# Patient Record
Sex: Female | Born: 1990 | Race: White | Hispanic: No | Marital: Married | Smoking: Never smoker
Health system: Southern US, Community
[De-identification: ages and names within clinical notes are randomized; demographics above are authoritative.]

## PROBLEM LIST (undated history)

## (undated) ENCOUNTER — Inpatient Hospital Stay (HOSPITAL_COMMUNITY): Payer: Self-pay

## (undated) DIAGNOSIS — T781XXA Other adverse food reactions, not elsewhere classified, initial encounter: Secondary | ICD-10-CM

## (undated) DIAGNOSIS — Z3490 Encounter for supervision of normal pregnancy, unspecified, unspecified trimester: Secondary | ICD-10-CM

## (undated) DIAGNOSIS — Z8759 Personal history of other complications of pregnancy, childbirth and the puerperium: Secondary | ICD-10-CM

## (undated) DIAGNOSIS — F909 Attention-deficit hyperactivity disorder, unspecified type: Secondary | ICD-10-CM

## (undated) DIAGNOSIS — R569 Unspecified convulsions: Secondary | ICD-10-CM

## (undated) DIAGNOSIS — I10 Essential (primary) hypertension: Secondary | ICD-10-CM

## (undated) DIAGNOSIS — T7819XA Other adverse food reactions, not elsewhere classified, initial encounter: Secondary | ICD-10-CM

## (undated) HISTORY — PX: NO PAST SURGERIES: SHX2092

## (undated) HISTORY — DX: Unspecified convulsions: R56.9

## (undated) HISTORY — DX: Personal history of other complications of pregnancy, childbirth and the puerperium: Z87.59

## (undated) HISTORY — DX: Attention-deficit hyperactivity disorder, unspecified type: F90.9

---
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
## 2011-04-28 NOTE — Discharge Summary (Signed)
[place]. [facility]  Patient:    Barbara Robertson, Barbara Robertson                      MRN: [id number] Adm. Date:  [id number] Disc. Date: [id number] Attending:  Mick Sell                           Discharge Summary  NEUROLOGIC:  Mental state is awake, alert, almost giddy, pleasant cooperative without dysphagia or ______ .  CRANIAL NERVE:  Round, reactive pupils, normal fundi, full facial fields, two double simultaneous stimuli.  Extraocular movements full and conjugate.  Okay and responses were not carried out.  Symmetric facial strength and sensation. Air conduction greater than bone conduction bilaterally.  MOTOR EXAMINATION:  Normal strength, tone and ______ no pronator drift. Sensation tactile, ______ examination, good finger-to-nose.  Rapid repetitive movements ______ were normal.  Deep tendon reflexes were symmetric and diminished.  SUMMARY OF THE LABORATORY STUDIES:  Initial pH 7.137, pCO2 93.4, pO2 367, bicarbonate 32.  After bagging, pH 7.334, pCO2 54.2 pO2 109, bicarbonate 29. Sodium 138, potassium 3.7, chloride 102, CO2 30, glucose 137, BUN 13, creatinine 0.4, calcium 8.9, total protein 7.1, albumin 4.0, SGOT 25, SGPT 14, alkaline phosphatase 254, total bilirubin 0.7.  CBC, white count 12,600, hemoglobin 13.7, hematocrit 39.9, MCV 89.3, platelet count 424,000.  There are 60 polys, 31 lymphs, 7 monos, 1 eosinophil, 1 basophil.  The patient has had regular sinus rhythm on telemetry.  Today, she is in good spirits, at normal baseline and is ready for discharge.  DISCHARGE MEDICATIONS:  She will go home on Depakote 250 mg 4 times a day.  FOLLOWUP:  Blood types will be checked in April to look at the trough of valproic acid level.  The patient will return in April or May for a return visit, sooner depending upon clinical need. DD:  02/23/01 TD:  02/23/01 Job: [id number]

## 2011-04-28 NOTE — Discharge Summary (Signed)
[place]. [facility]  Patient:    Barbara Robertson, Barbara Robertson                      MRN: [id number] Adm. Date:  [id number] Disc. Date: [id number] Attending:  Mick Sell                           Discharge Summary  FINAL DIAGNOSES: 1. 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 2. Complex partial seizures with secondary generalization, 345.40, 345.10. 3. Respiratory failure secondary to seizures, resolved.  PROCEDURES:  None.  SUMMARY OF THE HOSPITALIZATION:  The patient was admitted to the hospital after a period of over one hour of nearly continuous seizures.  The seizures began with the patient talking nonsense, but using real words and not truly responding to her parents and proceeded with forced eye deviation to the right and jerking movements of the right arm and leg.  Episodes continued on and off as the parents drove the patient into the hospital, and even continued afterwards.  The patient received 0.5 mg of Ativan which only briefly followed procedures.  They stopped completely when she was given Valium 5 mg.  At that point, arterial blood gas showed evidence of respiratory failure with a pH of 7.13, pCO2 of 95.4.  The patient was bagged for 20 minutes with marked improvement and increase in the level of arousal and activity in the patient.  The patients valproic acid level was 45.4, (two hours after morning trough). The decision was made to increase her Depakote.  I thought that she had evidence of otitis media.  Pediatric house officers did not agree and therefore she was not treated.  Through the night, she has been well, there have been no seizures.  Her current vital signs are:  Temperature 98.6, resting pulse 104, respirations 20, pulse oximetry 98%.  Head, eyes, ears, nose, and throat:  No signs of infection. Lungs:  Clear to auscultation.  Heart:  No murmurs.  Pulses normal. Abdomen:  Soft, nontender, bowel sounds are normal.  Extremities:  Well formed  without edema, cyanosis, alterations in tone, or tight heel cords. Neurologic:  Mental status:  Awake, alert, almost giddy, pleasant cooperative Cranial nerves:  Round, reactive pupils, normal fundi, full visual fields to double simultaneous stimuli.  Extraocular movements full and conjugate.  OKN responses were not carried out.  Symmetric facial strength and sensation.  Air conduction greater than bone conduction bilaterally.  Motor examination: Normal strength, tone, and mass.  Good fine motor movements, no pronator drift.  Sensation intact to cold, vibration, 1 stereognosis.  Cerebellar examination:  Good finger-to-nose.  Rapid repetitive movements.  No tremor, dystaxia, or dysmetria.  Gait and station were normal.  Deep tendon reflexes were symmetric and diminished.  SUMMARY OF THE LABORATORY STUDIES:  Initial pH 7.137, pCO2 93.4, pO2 367, bicarbonate 32.  After bagging, pH 7.334, pCO2 54.2 pO2 109, bicarbonate 29.  Sodium 138, potassium 3.7, chloride 102, CO2 30, glucose 137, BUN 13, creatinine 0.4, calcium 8.9, total protein 7.1, albumin 4.0, SGOT 25, SGPT 14, alkaline phosphatase 254, total bilirubin 0.7.  CBC, white count 12,600, hemoglobin 13.7, hematocrit 39.9, MCV 89.3, platelet count 424,000.  There are 60 polys, 31 lymphs, 7 monos, 1 eosinophil, 1 basophil.  The patient has had regular sinus rhythm on telemetry.  Today, she is in good spirits, at normal baseline and is ready for discharge.  DISCHARGE MEDICATIONS:  She will go home  on Depakote 250 mg 4 times a day.  FOLLOWUP:  Blood types will be checked in April to look at the trough of valproic acid level.  The patient will return in April or May for a return visit, sooner depending upon clinical need. without dysphagia or dystaxia. DD:  02/23/01 TD:  02/23/01 Job: [id number]

## 2011-04-28 NOTE — Discharge Summary (Signed)
[place]. [facility]  Patient:    Barbara Robertson, Barbara Robertson                      MRN: [id number] Adm. Date:  [id number] Disc. Date: [id number] Attending:  Mick Sell                           Discharge Summary  No dictation. DD:  02/23/01 TD:  02/23/01 Job: [id number]

## 2011-04-28 NOTE — H&P (Signed)
[place]. [facility]  Patient:    Barbara Robertson, Barbara Robertson                      MRN: [id number] Adm. Date:  [id number] Attending:  Molpus, Carlisle Beers CC:         Duard Brady, M.D.   History and Physical  DATE OF BIRTH:  1991/11/08  CHIEF COMPLAINT:  Status epilepticus.  HISTORY OF THE PRESENT CONDITION:  A [age]-year-old right-handed Caucasian female who had onset this morning at 8:20 a.m. of speaking nonsense as she awakened.  She was speaking real words but was not truly responding to her parents.  Her eyes began to deviate to the right at 8:30 a.m.  This was followed by jerking of the right arm and leg.  Parents recognized that the seizure was prolonged by 8:50 a.m., called our office, and were advised to transport the patient.  They arrived at 9:38 a.m. Ativan was given 0.5 mg at 9:45 a.m.  I came to see the patient around 9:50 a.m. and noted that the patient was postictal.  She then began to have more seizures and was treated with Valium 5 mg at 9:56 a.m. which relieved her seizures.  I ordered a STAT blood gas which showed a pH of 7.131, PCO2 95.4, PO2 369, bicarbonate 32.  She was bagged for 20 minutes.  Repeat blood gas pH 7.334, PCO2 54.2, PO2 109, bicarbonate 29.  The patient, during that time, became much more responsive, breathing on her own, and slightly combative.  MEDICATIONS:  Her current medications are Depakote 250 mg in the morning, 125 at noon, 125 at dinner, 250 at night - using 125 mg tablets.  The patient had been on phenobarbital and Tegretol in the past without success.  ALLERGIES TO MEDICINES:  None known.  HISTORY:  Mindys last seizure was August 17, 2000 - this was a brief episode at home lasting 10 to 15 minutes.  Her last seizure before that was two-and-a-half years ago.  She had onset of her seizures at 25 of age.  EEG in October 2001 was normal.  CT scan of the brain was normal other than enlarged  subarachnoid spaces back in 1993.  EEG soon after her right focal motor seizures showed left temporal slowing without seizure activity.  REVIEW OF SYSTEMS:  The patient had a viral syndrome with TMAX 100.5 degrees F treated with Tylenol on Tuesday.  She had otherwise been acting well.  She has had no problems with compliance.  Review of systems is otherwise negative for all major systems.  SOCIAL HISTORY:  Patient is in the third grade at [employer] doing well.  She is not receiving assistance.  She is working on grade level. She has no other outside activities.  FAMILY HISTORY:  Her father had a seizure as a boy.  There are no other neurologic medical problems.  PHYSICAL EXAMINATION:  VITAL SIGNS:  Blood pressure 82/58, resting pulse 110, respirations 50, pulse oximetry 97%.  HEENT:  Bilateral otitis media.  Pharynx is negative.  NECK:  Supple.  LUNGS:  Showed rhonchi and she was moving air poorly initially; now much better.  HEART:  No murmurs, pulses normal.  ABDOMEN:  Soft, bowel sounds normal.  EXTREMITIES:  Normal.  NEUROLOGIC:  Patient is a [age]-year-old right-handed girl who is postictal. Cranial nerves 2-12 reveal round, reactive pupils, 4 mm to 3 mm.  Fundi were normal.  No  blink to threat.  She had symmetric facial strength.  Motor examination shows moving all four extremities in a semi-purposeful way. Sensory shows she withdraws x 4.  Deep tendon reflexes were diminished.  She had bilateral flexor plantar responses.  IMPRESSION: 1. Status epilepticus with a left brain signature, partial onset with    secondary generalization - 345.3, 345.40, 345.10. 2. Respiratory failure secondary to seizures, which has resolved. 3. Etiology of this seizure is unknown. 4. Bilateral otitis media.  PLAN:  Patient will be given IV Depacon until she awakens and then change to Depakote.  She will have a comprehensive metabolic and a CBC with differential in  addition to trough valproic acid level.  She will be admitted for 24-hour observation, longer if necessary.  She will treated with amoxicillin for her ear infection.  The possibility of aspiration exists because she did vomit once.  She also lost control of her bladder in the postictal state.  She will be admitted to the pediatric service under my name with assistance from the pediatric house staff.  I have contacted ______ and he has agreed to come see the patient. DD:  02/22/01 TD:  02/22/01 Job: [id number]

## 2012-09-09 ENCOUNTER — Encounter (HOSPITAL_COMMUNITY): Payer: Self-pay | Admitting: *Deleted

## 2012-09-09 ENCOUNTER — Emergency Department (HOSPITAL_COMMUNITY)
Admission: EM | Admit: 2012-09-09 | Discharge: 2012-09-09 | Disposition: A | Discharge status: Home / Self Care | Payer: BC Managed Care – PPO | Attending: Emergency Medicine | Admitting: Emergency Medicine

## 2012-09-09 ENCOUNTER — Emergency Department (HOSPITAL_COMMUNITY): Payer: BC Managed Care – PPO

## 2012-09-09 DIAGNOSIS — M899 Disorder of bone, unspecified: Secondary | ICD-10-CM | POA: Insufficient documentation

## 2012-09-09 DIAGNOSIS — M25519 Pain in unspecified shoulder: Secondary | ICD-10-CM | POA: Insufficient documentation

## 2012-09-09 MED ORDER — IBUPROFEN 800 MG PO TABS
800.0000 mg | ORAL_TABLET | Freq: Once | ORAL | Status: AC
  Administered 2012-09-09: 800 mg via ORAL
  Filled 2012-09-09: qty 1

## 2012-09-09 MED ORDER — NAPROXEN 500 MG PO TABS: 500.0000 mg | ORAL_TABLET | Freq: Two times a day (BID) | ORAL | Status: DC

## 2012-09-09 MED ORDER — HYDROCODONE-ACETAMINOPHEN 5-325 MG PO TABS: ORAL_TABLET | ORAL | Status: DC

## 2012-09-09 NOTE — ED Provider Notes (Signed)
History     CSN: [id number]  Arrival date & time 09/09/12  1310   First MD Initiated Contact with Patient 09/09/12 1325      Chief Complaint  Patient presents with  . Shoulder Pain    (Consider location/radiation/quality/duration/timing/severity/associated sxs/prior treatment) HPI Comments: Patient c/o pain to her right shoulder that began after helping move furniture.  Pain to the shoulder is worse with certain movements and improves with rest.  She denies redness, fever, neck pain, swelling, distal numbness or weakness.  Denies previous injury or pain tot he shoulder.  Pt is right hand dominant  Patient is a 21 y.o. female presenting with shoulder pain. The history is provided by the patient.  Shoulder Pain This is a new problem. The current episode started today. The problem occurs constantly. The problem has been unchanged. Associated symptoms include arthralgias. Pertinent negatives include no chest pain, chills, fever, headaches, joint swelling, myalgias, neck pain, numbness, rash, sore throat, vomiting or weakness. The symptoms are aggravated by bending (movement). She has tried nothing for the symptoms. The treatment provided no relief.    History reviewed. No pertinent past medical history.  History reviewed. No pertinent past surgical history.  History reviewed. No pertinent family history.  History  Substance Use Topics  . Smoking status: Never Smoker   . Smokeless tobacco: Not on file  . Alcohol Use: No    OB History    Grav Para Term Preterm Abortions TAB SAB Ect Mult Living                  Review of Systems  Constitutional: Negative for fever and chills.  HENT: Negative for sore throat and neck pain.   Respiratory: Negative for shortness of breath.   Cardiovascular: Negative for chest pain.  Gastrointestinal: Negative for vomiting.  Genitourinary: Negative for dysuria and difficulty urinating.  Musculoskeletal: Positive for arthralgias. Negative for  myalgias, back pain and joint swelling.  Skin: Negative for color change, rash and wound.  Neurological: Negative for weakness, numbness and headaches.  All other systems reviewed and are negative.    Allergies  Review of patient's allergies indicates no known allergies.  Home Medications   Current Outpatient Rx  Name Route Sig Dispense Refill  . LEVONORGESTREL-ETHINYL ESTRAD 0.1-20 MG-MCG PO TABS Oral Take 1 tablet by mouth daily.      BP 175/86  Pulse 121  Temp 98.8 F (37.1 C) (Oral)  Resp 20  Ht 6' (1.829 m)  Wt 205 lb (92.987 kg)  BMI 27.80 kg/m2  SpO2 98%  LMP 09/02/2012  Physical Exam  Nursing note and vitals reviewed. Constitutional: She is oriented to person, place, and time. She appears well-developed and well-nourished. No distress.  HENT:  Head: Normocephalic and atraumatic.  Neck: Normal range of motion. Neck supple. No thyromegaly present.  Cardiovascular: Normal rate, regular rhythm, normal heart sounds and intact distal pulses.   No murmur heard. Pulmonary/Chest: Effort normal and breath sounds normal. No respiratory distress. She exhibits no tenderness.  Musculoskeletal: She exhibits tenderness. She exhibits no edema.       Right shoulder: She exhibits tenderness. She exhibits normal range of motion, no bony tenderness, no swelling, no effusion, no crepitus, no deformity, no laceration, no spasm, normal pulse and normal strength.       Arms:      ttp of the lateral right shoulder.  Pain with abduction of the right arm and rotation of the shoulder.  Radial pulse is brisk, sensation intact,  CR< 2 sec.  No abrasions, edema or deformity of the joint.   Lymphadenopathy:    She has no cervical adenopathy.  Neurological: She is alert and oriented to person, place, and time. She has normal strength. No cranial nerve deficit or sensory deficit. She exhibits normal muscle tone. Coordination normal.  Reflex Scores:      Tricep reflexes are 2+ on the right side and  2+ on the left side.      Bicep reflexes are 2+ on the right side and 2+ on the left side. Skin: Skin is warm and dry.    ED Course  Procedures (including critical care time)  Labs Reviewed - No data to display Dg Shoulder Right  09/09/2012  *RADIOLOGY REPORT*  Clinical Data: Shoulder pain, lifting heavy boxes 2 weeks ago  RIGHT SHOULDER - 2+ VIEW  Comparison: None.  Findings:  There is an approximately 1.3 x 1.0 cm lucent lesion within the proximal humeral metaphysis.  The provided internal rotation radiograph demonstrates this lesion to be centered within the cortex.  The borders of this lesion appear well defined on the provided AP radiograph, however there appears to be a minimal amount of periostitis involving the inferior aspect of lesion on the provided internal radiograph, possibly accentuated due to obliquity.  This finding is without associated fracture.  Limited visualization of the adjacent glenohumeral and AC joints is normal.  Limited visualization of adjacent thorax is normal.  Regional soft tissues are normal.  IMPRESSION: 1.  No fracture or dislocation. 2.  Approximately 1.3 cm cortically based lesion involving the anterior cortex of the proximal humeral metaphysis.  As there is a suggestion of a minimal amount of periostitis involving the inferior aspect of the lesion's margin on the provided internal radiograph, further evaluation with shoulder MRI is recommended.  The above findings discussed with Dr. Preston Fleeting at 1441   Original Report Authenticated By: Waynard Reeds, M.D.     Sling applied for comfort, remains NV intact    MDM   Pt advised of x-ray findings.  Agrees to return here for a MRI.  Arranged patient to return here for outpatient MRI right shoulder on Wednesday, 09/11/2012 at 6 PM.  Pt agrees to then f/u with orthopedics.  Referral given to Dr. Hilda Lias   The patient appears reasonably screened and/or stabilized for discharge and I doubt any other medical condition  or other [facility] requiring further screening, evaluation, or treatment in the ED at this time prior to discharge.   Rx: Norco #20 naprosyn      Camari Wisham L. [place], [place] 09/11/12 1543

## 2012-09-09 NOTE — ED Notes (Signed)
Pain rt shoulder , injury when helping move furniture.Marland Kitchen

## 2012-09-09 NOTE — ED Notes (Signed)
injuried right shoulder 2 weeks ago while moving furniture, states pain got better but today with sharp pain to area

## 2012-09-11 ENCOUNTER — Ambulatory Visit (HOSPITAL_COMMUNITY)
Admit: 2012-09-11 | Discharge: 2012-09-11 | Disposition: A | Discharge status: Home / Self Care | Payer: BC Managed Care – PPO | Source: Ambulatory Visit | Attending: Family Medicine | Admitting: Family Medicine

## 2012-09-11 DIAGNOSIS — M25519 Pain in unspecified shoulder: Secondary | ICD-10-CM | POA: Insufficient documentation

## 2012-09-11 DIAGNOSIS — R937 Abnormal findings on diagnostic imaging of other parts of musculoskeletal system: Secondary | ICD-10-CM | POA: Insufficient documentation

## 2012-09-12 NOTE — ED Provider Notes (Signed)
Medical screening examination/treatment/procedure(s) were performed by non-physician practitioner and as supervising physician I was immediately available for consultation/collaboration.   Dellar Traber, MD 09/12/12 0702 

## 2012-10-30 ENCOUNTER — Ambulatory Visit: Payer: BC Managed Care – PPO | Admitting: Orthopedic Surgery

## 2012-11-14 ENCOUNTER — Ambulatory Visit (INDEPENDENT_AMBULATORY_CARE_PROVIDER_SITE_OTHER): Payer: BC Managed Care – PPO | Admitting: Orthopedic Surgery

## 2012-11-14 ENCOUNTER — Encounter: Payer: Self-pay | Admitting: Orthopedic Surgery

## 2012-11-14 DIAGNOSIS — M75101 Unspecified rotator cuff tear or rupture of right shoulder, not specified as traumatic: Secondary | ICD-10-CM | POA: Insufficient documentation

## 2012-11-14 DIAGNOSIS — M67919 Unspecified disorder of synovium and tendon, unspecified shoulder: Secondary | ICD-10-CM

## 2012-11-14 MED ORDER — IBUPROFEN 800 MG PO TABS: 800.0000 mg | ORAL_TABLET | Freq: Three times a day (TID) | ORAL | Status: DC | PRN

## 2012-11-14 MED ORDER — TRAMADOL-ACETAMINOPHEN 37.5-325 MG PO TABS: 1.0000 | ORAL_TABLET | ORAL | Status: DC | PRN

## 2012-11-14 NOTE — Progress Notes (Signed)
Patient ID: Barbara Robertson, female   DOB: 1991-04-25, 21 y.o.   MRN: [id number] Chief Complaint  Patient presents with  . Shoulder Pain    Right shoulder pain for 6 months. Dr Gerda Diss referral      21 year old female history of bilateral shoulder pain in high school treated with physical therapy didn't help now works in [employer] site and complaint of right shoulder pain which she says she reaggravated moving some furniture about 6 months ago. She complains of sharp dull throbbing pain with stabbing locking and catching over the right deltoid and rates the pain 10 out of 10. She took some ibuprofen it didn't help she went to the ER she received some Norco that made her sick so she didn't take that. She does have night pain pain with overhead activity  Review of systems blurred vision nausea, vomiting, dizziness, bruises easily, adverse food reaction  Medical history reviewed.  Physical Exam(12)  Vital signs:   GENERAL: normal development   CDV: pulses are normal   Skin: normal  Lymph: nodes were not palpable/normal  Psychiatric: awake, alert and oriented  Neuro: normal sensation  MSK  Gait: Normal 1 Inspection right shoulder no pain swelling. There is some tenderness under the posterior acromion and also near the deltoid insertion, tenderness and the rotator interval 2 Range of Motion range of motion is normal 3 Motor strength is normal 4 Stability stability is normal  Provocative tests for the shoulder the Neer and impingement Hawkins maneuvers do cause some discomfort  Other side:  5 normal range of motion normal strength 6 normal stability no tenderness  Neck full range of motion  Imaging include right shoulder 2 views on September 30 an MRI on October 2 x-ray shows 1.3 cm cortical lesion proximal humeral metaphysis which may be either an aneurysmal bone cyst eosinophilic granuloma or benign cortical bone cyst  Radiology recommends a six-month followup  MRI    Assessment:  Rotator cuff syndrome right shoulder  Bone lesion right humeral metaphysis unclear needs followup, differential diagnosis aneurysmal bone cyst, eosinophilic granuloma, benign cortical bone cyst.    Plan: Subacromial injection, physical therapy, ibuprofen, Ultracet  Come back 2 months  Shoulder Injection Procedure Note   Pre-operative Diagnosis: right  RC Syndrome  Post-operative Diagnosis: same  Indications: pain   Anesthesia: ethyl chloride   Procedure Details   Verbal consent was obtained for the procedure. The shoulder was prepped withalcohol and the skin was anesthetized. A 20 gauge needle was advanced into the subacromial space through posterior approach without difficulty  The space was then injected with 3 ml 1% lidocaine and 1 ml of depomedrol. The injection site was cleansed with isopropyl alcohol and a dressing was applied.  Complications:  None; patient tolerated the procedure well.

## 2012-11-14 NOTE — Patient Instructions (Signed)
You have received a steroid shot. 15% of patients experience increased pain at the injection site with in the next 24 hours. This is best treated with ice and tylenol extra strength 2 tabs every 8 hours. If you are still having pain please call the office.    Call hospital to arrange PT    Rotator Cuff Tendonitis   The rotator cuff is the collection of all the muscles and tendons (the supraspinatus, infraspinatus, subscapularis, and teres minor muscles and their tendons) that help your shoulder stay in place. This unit holds the head of the upper arm bone (humerus) in the cup (fossa) of the shoulder blade (scapula). Basically, it connects the arm to the shoulder. Tendinitis is a swelling and irritation of the tissue, called cord like structures (tendons) that connect muscle to bone. It usually is caused by overusing the joint involved. When the tissue surrounding a tendon (the synovium) becomes inflamed, it is called tenosynovitis. This also is often the result of overuse in people whose jobs require repetitive (over and over again) types of motion. HOME CARE INSTRUCTIONS    Use a sling or splint for as long as directed by your caregiver until the pain decreases.   Apply ice to the injury for 15 to 20 minutes, 3 to 4 times per day. Put the ice in a plastic bag and place a towel between the bag of ice and your skin.   Try to avoid use other than gentle range of motion while your shoulder is painful. Use and exercise only as directed by your caregiver. Stop exercises or range of motion if pain or discomfort increases, unless directed otherwise by your caregiver.   Only take over-the-counter or prescription medicines for pain, discomfort, or fever as directed by your caregiver.   If you were give a shoulder sling and straps (immobilizer), do not remove it except as directed, or until you see a caregiver for a follow-up examination. If you need to remove it, move your arm as little as possible or as  directed.   You may want to sleep on several pillows at night to lessen swelling and pain.  SEEK IMMEDIATE MEDICAL CARE IF:    Pain in your shoulder increases or new pain develops in your arm, hand, or fingers and is not relieved with medications.   You develop new, unexplained symptoms, especially increased numbness in the hands or loss of strength, or you develop any worsening of the problems which brought you in for care.   Your arm, hand, or fingers are numb or tingling.   Your arm, hand, or fingers are swollen, painful, or turn white or blue.  Document Released: 02/17/2004 Document Revised: 02/19/2012 Document Reviewed: 09/24/2008 [facility] Patient Information 2013 [place], [place].

## 2013-03-20 ENCOUNTER — Encounter: Payer: Self-pay | Admitting: *Deleted

## 2013-03-26 ENCOUNTER — Ambulatory Visit (INDEPENDENT_AMBULATORY_CARE_PROVIDER_SITE_OTHER): Payer: BC Managed Care – PPO | Admitting: Nurse Practitioner

## 2013-03-26 VITALS — BP 146/88 | Ht 73.0 in | Wt 216.8 lb

## 2013-03-26 DIAGNOSIS — N912 Amenorrhea, unspecified: Secondary | ICD-10-CM

## 2013-03-27 DIAGNOSIS — N912 Amenorrhea, unspecified: Secondary | ICD-10-CM | POA: Insufficient documentation

## 2013-03-27 NOTE — Assessment & Plan Note (Signed)
Qualitative serum hCG ordered. Advised patient to contact us if she does not have a regular menstrual cycle at least every 3 months. Discussed preconceptual care including starting a daily multivitamin. Call back if any problems.

## 2013-03-27 NOTE — Progress Notes (Signed)
Subjective:  Presents to discuss her menstrual cycles. Has been off her birth control pills for at least 2 months. Noticed progressive nausea. Cycles are slightly irregular, longer periods of time between cycles which are shorter only lasting a couple of days. Did not have a cycle in March, had a fairly normal cycle for 2 days in April. Has also had some slight breakthrough bleeding. Married, same sexual partner. No vaginal discharge. No pelvic pain. No fever. Patient wishes to hold off on contraceptives, having unprotected sex. While not actively trying to conceive, states it's okay if it happens. Has taken several home pregnancy tests all which were negative. No breast tenderness nausea or vomiting.  Objective:   BP 146/88  Ht 6\' 1"  (1.854 m)  Wt 216 lb 12.8 oz (98.34 kg)  BMI 28.61 kg/m2  LMP 03/11/2013 NAD. Alert, oriented. Lungs clear. Heart regular rate rhythm.

## 2013-06-18 ENCOUNTER — Telehealth: Payer: Self-pay | Admitting: Family Medicine

## 2013-06-18 NOTE — Telephone Encounter (Signed)
Patient is calling to say that she took 2 at home pregnancy tests yesterday and both were positive and she would like to know if you would suggest her keeping her appointment for Friday?

## 2013-06-18 NOTE — Telephone Encounter (Signed)
Has appointment with Barbara Robertson on Friday.

## 2013-06-18 NOTE — Telephone Encounter (Signed)
Yes, we can confirm plus help with symptoms supplements etc. While waiting to see ob.

## 2013-06-18 NOTE — Telephone Encounter (Signed)
Discussed with patient. Patient to keep appointment.

## 2013-06-20 ENCOUNTER — Ambulatory Visit (INDEPENDENT_AMBULATORY_CARE_PROVIDER_SITE_OTHER): Payer: BC Managed Care – PPO | Admitting: Nurse Practitioner

## 2013-06-20 ENCOUNTER — Encounter: Payer: Self-pay | Admitting: Nurse Practitioner

## 2013-06-20 DIAGNOSIS — N912 Amenorrhea, unspecified: Secondary | ICD-10-CM

## 2013-06-20 DIAGNOSIS — Z3490 Encounter for supervision of normal pregnancy, unspecified, unspecified trimester: Secondary | ICD-10-CM

## 2013-06-22 ENCOUNTER — Inpatient Hospital Stay (HOSPITAL_COMMUNITY)
Admission: AD | Admit: 2013-06-22 | Discharge: 2013-06-23 | Disposition: A | Discharge status: Home / Self Care | Payer: BC Managed Care – PPO | Source: Ambulatory Visit | Attending: Obstetrics & Gynecology | Admitting: Obstetrics & Gynecology

## 2013-06-22 DIAGNOSIS — O209 Hemorrhage in early pregnancy, unspecified: Secondary | ICD-10-CM | POA: Insufficient documentation

## 2013-06-22 DIAGNOSIS — O360990 Maternal care for other rhesus isoimmunization, unspecified trimester, not applicable or unspecified: Secondary | ICD-10-CM | POA: Insufficient documentation

## 2013-06-23 ENCOUNTER — Inpatient Hospital Stay (HOSPITAL_COMMUNITY): Payer: BC Managed Care – PPO

## 2013-06-23 ENCOUNTER — Encounter (HOSPITAL_COMMUNITY): Payer: Self-pay

## 2013-06-23 ENCOUNTER — Encounter: Payer: Self-pay | Admitting: Nurse Practitioner

## 2013-06-23 DIAGNOSIS — O360990 Maternal care for other rhesus isoimmunization, unspecified trimester, not applicable or unspecified: Secondary | ICD-10-CM | POA: Diagnosis not present

## 2013-06-23 DIAGNOSIS — Z3400 Encounter for supervision of normal first pregnancy, unspecified trimester: Secondary | ICD-10-CM | POA: Insufficient documentation

## 2013-06-23 DIAGNOSIS — O209 Hemorrhage in early pregnancy, unspecified: Secondary | ICD-10-CM | POA: Diagnosis not present

## 2013-06-23 LAB — WET PREP, GENITAL

## 2013-06-23 LAB — URINALYSIS, ROUTINE W REFLEX MICROSCOPIC
Bilirubin Urine: NEGATIVE
Ketones, ur: NEGATIVE mg/dL
Leukocytes, UA: NEGATIVE
Nitrite: NEGATIVE
Protein, ur: NEGATIVE mg/dL
Specific Gravity, Urine: 1.01 (ref 1.005–1.030)
Urobilinogen, UA: 1 mg/dL (ref 0.0–1.0)

## 2013-06-23 LAB — CBC
MCHC: 35.7 g/dL (ref 30.0–36.0)
MCV: 84.5 fL (ref 78.0–100.0)
RDW: 12.7 % (ref 11.5–15.5)

## 2013-06-23 LAB — POCT PREGNANCY, URINE: Preg Test, Ur: POSITIVE — AB

## 2013-06-23 LAB — GC/CHLAMYDIA PROBE AMP: CT Probe RNA: NEGATIVE

## 2013-06-23 LAB — ABO/RH: ABO/RH(D): A NEG

## 2013-06-23 LAB — URINE MICROSCOPIC-ADD ON

## 2013-06-23 MED ORDER — RHO D IMMUNE GLOBULIN 1500 UNIT/2ML IJ SOLN
300.0000 ug | Freq: Once | INTRAMUSCULAR | Status: AC
  Administered 2013-06-23: 300 ug via INTRAMUSCULAR
  Filled 2013-06-23: qty 2

## 2013-06-23 NOTE — MAU Note (Signed)
Pt reports blood on tissue tonight, denies pain. Positive home preg test x 2

## 2013-06-23 NOTE — MAU Provider Note (Signed)
History     CSN: [id number]  Arrival date and time: 06/22/13 2359   First Provider Initiated Contact with Patient 06/23/13 [phone]      Chief Complaint  Patient presents with  . Possible Pregnancy  . Vaginal Bleeding   HPI [name] is a 22 y.o. G1P0000 at [redacted]w[redacted]d by LMP who presents to MAU today with complaint of vaginal bleeding. The patient states+HPT earlier this week x 2. She states that tonight when she wiped she noticed some pink spotting on the tissue. She denies seeing any blood in her underwear or having to wear a pad. She denies pain, N/V/D or constipation, UTI symptoms, fever or vaginal discharge. She last had intercourse yesterday. LMP was 05/04/13 however the patient states ~ 6 months of irregular periods and bleeding in may was not typical of a normal period for her.    OB History   Grav Para Term Preterm Abortions TAB SAB Ect Mult Living   1 0 0 0 0 0 0 0 0 0       Past Medical History  Diagnosis Date  . ADHD (attention deficit hyperactivity disorder)   . Seizures     as a child, last sz 12 yrs ago    Past Surgical History  Procedure Laterality Date  . No past surgeries      Family History  Problem Relation Age of Onset  . Arthritis    . Cancer    . Diabetes      History  Substance Use Topics  . Smoking status: Never Smoker   . Smokeless tobacco: Not on file  . Alcohol Use: No    Allergies:  Allergies  Allergen Reactions  . Other     Grapes    Prescriptions prior to admission  Medication Sig Dispense Refill  . Prenatal Vit-Fe Fumarate-FA (PRENATAL MULTIVITAMIN) TABS Take 1 tablet by mouth daily at 12 noon.        Review of Systems  Constitutional: Negative for fever and malaise/fatigue.  Gastrointestinal: Negative for nausea, vomiting, abdominal pain, diarrhea and constipation.  Genitourinary: Negative for dysuria, urgency and frequency.       + vaginal bleeding Neg - vaginal discharge   Physical Exam   Blood pressure 146/90,  pulse 112, temperature 99.2 F (37.3 C), temperature source Oral, resp. rate 18, height 5\' 10"  (1.778 m), weight 213 lb (96.616 kg), last menstrual period 05/04/2013, SpO2 97.00%.  Physical Exam  Constitutional: She is oriented to person, place, and time. She appears well-developed and well-nourished. No distress.  HENT:  Head: Normocephalic and atraumatic.  Cardiovascular: Regular rhythm and normal heart sounds.  Tachycardia present.   Respiratory: Effort normal and breath sounds normal. No respiratory distress.  GI: Soft. Bowel sounds are normal. She exhibits no distension and no mass. There is no tenderness. There is no rebound and no guarding.  Genitourinary: Uterus is not enlarged (exam limited by maternal body habitus) and not tender. Cervix exhibits discharge (small amount of active bleeding noted at the cervical os). Cervix exhibits no motion tenderness and no friability. Right adnexum displays no mass and no tenderness. Left adnexum displays no mass and no tenderness. There is bleeding (small amount of bleeding noted in the vaginal vault) around the vagina. No vaginal discharge found.  Neurological: She is alert and oriented to person, place, and time.  Skin: Skin is warm and dry. No erythema.  Psychiatric: She has a normal mood and affect.   Results for orders placed during the  hospital encounter of 06/22/13 (from the past 24 hour(s))  URINALYSIS, ROUTINE W REFLEX MICROSCOPIC     Status: Abnormal   Collection Time    06/23/13 12:10 AM      Result Value Range   Color, Urine YELLOW  YELLOW   APPearance CLEAR  CLEAR   Specific Gravity, Urine 1.010  1.005 - 1.030   pH 6.0  5.0 - 8.0   Glucose, UA NEGATIVE  NEGATIVE mg/dL   Hgb urine dipstick LARGE (*) NEGATIVE   Bilirubin Urine NEGATIVE  NEGATIVE   Ketones, ur NEGATIVE  NEGATIVE mg/dL   Protein, ur NEGATIVE  NEGATIVE mg/dL   Urobilinogen, UA 1.0  0.0 - 1.0 mg/dL   Nitrite NEGATIVE  NEGATIVE   Leukocytes, UA NEGATIVE  NEGATIVE   URINE MICROSCOPIC-ADD ON     Status: Abnormal   Collection Time    06/23/13 12:10 AM      Result Value Range   Squamous Epithelial / LPF FEW (*) RARE   WBC, UA 0-2  <3 WBC/hpf   RBC / HPF 11-20  <3 RBC/hpf   Bacteria, UA FEW (*) RARE  POCT PREGNANCY, URINE     Status: Abnormal   Collection Time    06/23/13 12:16 AM      Result Value Range   Preg Test, Ur POSITIVE (*) NEGATIVE  WET PREP, GENITAL     Status: Abnormal   Collection Time    06/23/13 12:30 AM      Result Value Range   Yeast Wet Prep HPF POC NONE SEEN  NONE SEEN   Trich, Wet Prep NONE SEEN  NONE SEEN   Clue Cells Wet Prep HPF POC FEW (*) NONE SEEN   WBC, Wet Prep HPF POC FEW (*) NONE SEEN  CBC     Status: Abnormal   Collection Time    06/23/13 12:53 AM      Result Value Range   WBC 10.9 (*) 4.0 - 10.5 K/uL   RBC 4.51  3.87 - 5.11 MIL/uL   Hemoglobin 13.6  12.0 - 15.0 g/dL   HCT 45.4  09.8 - 11.9 %   MCV 84.5  78.0 - 100.0 fL   MCH 30.2  26.0 - 34.0 pg   MCHC 35.7  30.0 - 36.0 g/dL   RDW 14.7  82.9 - 56.2 %   Platelets 210  150 - 400 K/uL  ABO/RH     Status: None   Collection Time    06/23/13 12:53 AM      Result Value Range   ABO/RH(D) A NEG    HCG, QUANTITATIVE, PREGNANCY     Status: Abnormal   Collection Time    06/23/13 12:53 AM      Result Value Range   hCG, Beta Chain, Quant, S 13086 (*) <5 mIU/mL  RH IG WORKUP (INCLUDES ABO/RH)     Status: None   Collection Time    06/23/13 12:56 AM      Result Value Range   Gestational Age(Wks) 7     ABO/RH(D) A NEG     Antibody Screen NEG     Unit Number [id number]     Blood Component Type RHIG     Unit division 00     Status of Unit ALLOCATED     Transfusion Status OK TO TRANSFUSE      US Ob Comp Less 14 Wks  06/23/2013   *RADIOLOGY REPORT*  Clinical Data: Vaginal bleeding.  OBSTETRIC <14 WK [place] AND TRANSVAGINAL OB  US  Technique:  Both transabdominal and transvaginal ultrasound examinations were performed for complete evaluation of the gestation as well  as the maternal uterus, adnexal regions, and pelvic cul-de-sac.  Transvaginal technique was performed to assess early pregnancy.  Comparison:  None.  Intrauterine gestational sac:  Visualized/normal in shape. Yolk sac: Yes Embryo: No Cardiac Activity: N/A  MSD: 12.3 mm  6 w 0 d  Maternal uterus/adnexae: No subchorionic hemorrhage is noted.  The uterus is otherwise unremarkable in appearance.  The ovaries are within normal limits.  The right ovary measures 3.8 x 1.7 x 2.2 cm, while the left ovary measures 3.3 x 3.0 x 2.0 cm. No suspicious adnexal masses are seen; there is no evidence for ovarian torsion.  No free fluid is seen in the pelvic cul-de-sac.  IMPRESSION: Single intrauterine gestational sac noted, with a mean sac diameter of 12 mm, corresponding to a gestational age of [redacted] weeks 0 days. This does not match the gestational age of [redacted] weeks 1 day by LMP, and suggests an estimated date of delivery of February 16, 2014.  The embryo is not yet seen.   Original Report Authenticated By: Tonia Ghent, M.D.   US Ob Transvaginal  06/23/2013   *RADIOLOGY REPORT*  Clinical Data: Vaginal bleeding.  OBSTETRIC <14 WK [place] AND TRANSVAGINAL OB US  Technique:  Both transabdominal and transvaginal ultrasound examinations were performed for complete evaluation of the gestation as well as the maternal uterus, adnexal regions, and pelvic cul-de-sac.  Transvaginal technique was performed to assess early pregnancy.  Comparison:  None.  Intrauterine gestational sac:  Visualized/normal in shape. Yolk sac: Yes Embryo: No Cardiac Activity: N/A  MSD: 12.3 mm  6 w 0 d  Maternal uterus/adnexae: No subchorionic hemorrhage is noted.  The uterus is otherwise unremarkable in appearance.  The ovaries are within normal limits.  The right ovary measures 3.8 x 1.7 x 2.2 cm, while the left ovary measures 3.3 x 3.0 x 2.0 cm. No suspicious adnexal masses are seen; there is no evidence for ovarian torsion.  No free fluid is seen in the pelvic cul-de-sac.   IMPRESSION: Single intrauterine gestational sac noted, with a mean sac diameter of 12 mm, corresponding to a gestational age of [redacted] weeks 0 days. This does not match the gestational age of [redacted] weeks 1 day by LMP, and suggests an estimated date of delivery of February 16, 2014.  The embryo is not yet seen.   Original Report Authenticated By: Tonia Ghent, M.D.    MAU Course  Procedures None  MDM +UPT Patient had intercourse yesterday. Cervix does not appear friable and amount of bleeding noted in the vagina appears to be more than expected for post-coital bleeding in pregnancy Unable to obtain FHTs, attempted as patient states that LMP dating given was "not a normal period" UA, wet prep, GC/Chlamydia, CBC, ABO/Rh, quant hCG and [place] today Rhogam work-up completed and Rhophylac given in MAU  Assessment and Plan  A: IUGS and YS at [redacted]w[redacted]d Vaginal bleeding in early pregnancy Rh negative  P: Discharge home Bleeding precautions and first trimester warning signs discussed Rhophylac given in MAU today Patient encouraged to start prenatal care as soon as possible Patient may return to MAU as needed or if her condition were to change or worsen  Freddi Starr, PA-C  06/23/2013, 2:10 AM

## 2013-06-23 NOTE — Progress Notes (Signed)
Subjective:  Presents to discuss prenatal care. Has had 3 positive pregnancy tests at home. Her last known menstrual cycle was at the end of May. States her cycles are usually very irregular. No bleeding. No discharge. No pelvic pain. No nausea vomiting. No fever. Has already started her prenatal vitamins.  Objective:   NAD. Alert, oriented. Lungs clear. Heart regular rate rhythm. Abdomen soft nondistended nontender, no palpable fundal height.  Assessment:Pregnancy  Plan: Given information on obstetricians. Discussed early prenatal care issues. Patient to call to schedule her first prenatal appointment, call or go to ER sooner if any problems.

## 2013-06-23 NOTE — Assessment & Plan Note (Signed)
Patient to make appointment for prenatal care.

## 2013-06-24 LAB — RH IG WORKUP (INCLUDES ABO/RH)
Antibody Screen: NEGATIVE
Gestational Age(Wks): 7

## 2013-07-07 ENCOUNTER — Encounter: Payer: Self-pay | Admitting: Obstetrics and Gynecology

## 2013-07-07 ENCOUNTER — Ambulatory Visit (INDEPENDENT_AMBULATORY_CARE_PROVIDER_SITE_OTHER): Payer: BC Managed Care – PPO | Admitting: Obstetrics and Gynecology

## 2013-07-07 ENCOUNTER — Other Ambulatory Visit: Payer: Self-pay | Admitting: Family Medicine

## 2013-07-07 ENCOUNTER — Ambulatory Visit (HOSPITAL_COMMUNITY)
Admission: RE | Admit: 2013-07-07 | Discharge: 2013-07-07 | Disposition: A | Discharge status: Home / Self Care | Payer: BC Managed Care – PPO | Source: Ambulatory Visit | Attending: Family Medicine | Admitting: Family Medicine

## 2013-07-07 VITALS — BP 141/83 | Temp 97.6°F | Ht 72.0 in | Wt 214.2 lb

## 2013-07-07 DIAGNOSIS — Z3491 Encounter for supervision of normal pregnancy, unspecified, first trimester: Secondary | ICD-10-CM

## 2013-07-07 DIAGNOSIS — Z3401 Encounter for supervision of normal first pregnancy, first trimester: Secondary | ICD-10-CM

## 2013-07-07 DIAGNOSIS — Z3689 Encounter for other specified antenatal screening: Secondary | ICD-10-CM | POA: Insufficient documentation

## 2013-07-07 DIAGNOSIS — Z6791 Unspecified blood type, Rh negative: Secondary | ICD-10-CM | POA: Insufficient documentation

## 2013-07-07 DIAGNOSIS — R030 Elevated blood-pressure reading, without diagnosis of hypertension: Secondary | ICD-10-CM

## 2013-07-07 DIAGNOSIS — O360110 Maternal care for anti-D [Rh] antibodies, first trimester, not applicable or unspecified: Secondary | ICD-10-CM

## 2013-07-07 DIAGNOSIS — O360990 Maternal care for other rhesus isoimmunization, unspecified trimester, not applicable or unspecified: Secondary | ICD-10-CM

## 2013-07-07 DIAGNOSIS — O26899 Other specified pregnancy related conditions, unspecified trimester: Secondary | ICD-10-CM | POA: Insufficient documentation

## 2013-07-07 DIAGNOSIS — O209 Hemorrhage in early pregnancy, unspecified: Secondary | ICD-10-CM | POA: Insufficient documentation

## 2013-07-07 LAB — COMPREHENSIVE METABOLIC PANEL
ALT: 32 U/L (ref 0–35)
CO2: 24 mEq/L (ref 19–32)
Calcium: 8.9 mg/dL (ref 8.4–10.5)
Chloride: 104 mEq/L (ref 96–112)
Creat: 0.59 mg/dL (ref 0.50–1.10)
Glucose, Bld: 75 mg/dL (ref 70–99)
Total Protein: 6.5 g/dL (ref 6.0–8.3)

## 2013-07-07 LAB — HIV ANTIBODY (ROUTINE TESTING W REFLEX): HIV: NONREACTIVE

## 2013-07-07 LAB — POCT URINALYSIS DIP (DEVICE)
Hgb urine dipstick: NEGATIVE
Ketones, ur: NEGATIVE mg/dL
Protein, ur: NEGATIVE mg/dL
Specific Gravity, Urine: 1.015 (ref 1.005–1.030)
Urobilinogen, UA: 0.2 mg/dL (ref 0.0–1.0)
pH: 7 (ref 5.0–8.0)

## 2013-07-07 NOTE — Addendum Note (Signed)
Addended by: Gerome Apley on: 07/07/2013 01:22 PM   Modules accepted: Orders

## 2013-07-07 NOTE — Patient Instructions (Addendum)
Pregnancy - First Trimester  During sexual intercourse, millions of sperm go into the vagina. Only 1 sperm will penetrate and fertilize the female egg while it is in the Fallopian tube. One week later, the fertilized egg implants into the wall of the uterus. An embryo begins to develop into a baby. At 6 to 8 weeks, the eyes and face are formed and the heartbeat can be seen on ultrasound. At the end of 12 weeks (first trimester), all the baby's organs are formed. Now that you are pregnant, you will want to do everything you can to have a healthy baby. Two of the most important things are to get good prenatal care and follow your caregiver's instructions. Prenatal care is all the medical care you receive before the baby's birth. It is given to prevent, find, and treat problems during the pregnancy and childbirth.  PRENATAL EXAMS  · During prenatal visits, your weight, blood pressure, and urine are checked. This is done to make sure you are healthy and progressing normally during the pregnancy.  · A pregnant woman should gain 25 to 35 pounds during the pregnancy. However, if you are overweight or underweight, your caregiver will advise you regarding your weight.  · Your caregiver will ask and answer questions for you.  · Blood work, cervical cultures, other necessary tests, and a Pap test are done during your prenatal exams. These tests are done to check on your health and the probable health of your baby. Tests are strongly recommended and done for HIV with your permission. This is the virus that causes AIDS. These tests are done because medicines can be given to help prevent your baby from being born with this infection should you have been infected without knowing it. Blood work is also used to find out your blood type, previous infections, and follow your blood levels (hemoglobin).  · Low hemoglobin (anemia) is common during pregnancy. Iron and vitamins are given to help prevent this. Later in the pregnancy, blood  tests for diabetes will be done along with any other tests if any problems develop.  · You may need other tests to make sure you and the baby are doing well.  CHANGES DURING THE FIRST TRIMESTER   Your body goes through many changes during pregnancy. They vary from person to person. Talk to your caregiver about changes you notice and are concerned about. Changes can include:  · Your menstrual period stops.  · The egg and sperm carry the genes that determine what you look like. Genes from you and your partner are forming a baby. The female genes determine whether the baby is a boy or a girl.  · Your body increases in girth and you may feel bloated.  · Feeling sick to your stomach (nauseous) and throwing up (vomiting). If the vomiting is uncontrollable, call your caregiver.  · Your breasts will begin to enlarge and become tender.  · Your nipples may stick out more and become darker.  · The need to urinate more. Painful urination may mean you have a bladder infection.  · Tiring easily.  · Loss of appetite.  · Cravings for certain kinds of food.  · At first, you may gain or lose a couple of pounds.  · You may have changes in your emotions from day to day (excited to be pregnant or concerned something may go wrong with the pregnancy and baby).  · You may have more vivid and strange dreams.  HOME CARE INSTRUCTIONS   ·   It is very important to avoid all smoking, alcohol and non-prescribed drugs during your pregnancy. These affect the formation and growth of the baby. Avoid chemicals while pregnant to ensure the delivery of a healthy infant.  · Start your prenatal visits by the 12th week of pregnancy. They are usually scheduled monthly at first, then more often in the last 2 months before delivery. Keep your caregiver's appointments. Follow your caregiver's instructions regarding medicine use, blood and lab tests, exercise, and diet.  · During pregnancy, you are providing food for you and your baby. Eat regular, well-balanced  meals. Choose foods such as meat, fish, milk and other low fat dairy products, vegetables, fruits, and whole-grain breads and cereals. Your caregiver will tell you of the ideal weight gain.  · You can help morning sickness by keeping soda crackers at the bedside. Eat a couple before arising in the morning. You may want to use the crackers without salt on them.  · Eating 4 to 5 small meals rather than 3 large meals a day also may help the nausea and vomiting.  · Drinking liquids between meals instead of during meals also seems to help nausea and vomiting.  · A physical sexual relationship may be continued throughout pregnancy if there are no other problems. Problems may be early (premature) leaking of amniotic fluid from the membranes, vaginal bleeding, or belly (abdominal) pain.  · Exercise regularly if there are no restrictions. Check with your caregiver or physical therapist if you are unsure of the safety of some of your exercises. Greater weight gain will occur in the last 2 trimesters of pregnancy. Exercising will help:  · Control your weight.  · Keep you in shape.  · Prepare you for labor and delivery.  · Help you lose your pregnancy weight after you deliver your baby.  · Wear a good support or jogging bra for breast tenderness during pregnancy. This may help if worn during sleep too.  · Ask when prenatal classes are available. Begin classes when they are offered.  · Do not use hot tubs, steam rooms, or saunas.  · Wear your seat belt when driving. This protects you and your baby if you are in an accident.  · Avoid raw meat, uncooked cheese, cat litter boxes, and soil used by cats throughout the pregnancy. These carry germs that can cause birth defects in the baby.  · The first trimester is a good time to visit your dentist for your dental health. Getting your teeth cleaned is okay. Use a softer toothbrush and brush gently during pregnancy.  · Ask for help if you have financial, counseling, or nutritional needs  during pregnancy. Your caregiver will be able to offer counseling for these needs as well as refer you for other special needs.  · Do not take any medicines or herbs unless told by your caregiver.  · Inform your caregiver if there is any mental or physical domestic violence.  · Make a list of emergency phone numbers of family, friends, hospital, and police and fire departments.  · Write down your questions. Take them to your prenatal visit.  · Do not douche.  · Do not cross your legs.  · If you have to stand for long periods of time, rotate you feet or take small steps in a circle.  · You may have more vaginal secretions that may require a sanitary pad. Do not use tampons or scented sanitary pads.  MEDICINES AND DRUG USE IN PREGNANCY  ·   Take prenatal vitamins as directed. The vitamin should contain 1 milligram of folic acid. Keep all vitamins out of reach of children. Only a couple vitamins or tablets containing iron may be fatal to a baby or [place] child when ingested.  · Avoid use of all medicines, including herbs, over-the-counter medicines, not prescribed or suggested by your caregiver. Only take over-the-counter or prescription medicines for pain, discomfort, or fever as directed by your caregiver. Do not use aspirin, ibuprofen, or naproxen unless directed by your caregiver.  · Let your caregiver also know about herbs you may be using.  · Alcohol is related to a number of birth defects. This includes fetal alcohol syndrome. All alcohol, in any form, should be avoided completely. Smoking will cause low birth rate and premature babies.  · Street or illegal drugs are very harmful to the baby. They are absolutely forbidden. A baby born to an addicted mother will be addicted at birth. The baby will go through the same withdrawal an adult does.  · Let your caregiver know about any medicines that you have to take and for what reason you take them.  SEEK MEDICAL CARE IF:   You have any concerns or worries during your  pregnancy. It is better to call with your questions if you feel they cannot wait, rather than worry about them.  SEEK IMMEDIATE MEDICAL CARE IF:   · An unexplained oral temperature above 102° F (38.9° C) develops, or as your caregiver suggests.  · You have leaking of fluid from the vagina (birth canal). If leaking membranes are suspected, take your temperature and inform your caregiver of this when you call.  · There is vaginal spotting or bleeding. Notify your caregiver of the amount and how many pads are used.  · You develop a bad smelling vaginal discharge with a change in the color.  · You continue to feel sick to your stomach (nauseated) and have no relief from remedies suggested. You vomit blood or coffee ground-like materials.  · You lose more than 2 pounds of weight in 1 week.  · You gain more than 2 pounds of weight in 1 week and you notice swelling of your face, hands, feet, or legs.  · You gain 5 pounds or more in 1 week (even if you do not have swelling of your hands, face, legs, or feet).  · You get exposed to German measles and have never had them.  · You are exposed to fifth disease or chickenpox.  · You develop belly (abdominal) pain. Round ligament discomfort is a common non-cancerous (benign) cause of abdominal pain in pregnancy. Your caregiver still must evaluate this.  · You develop headache, fever, diarrhea, pain with urination, or shortness of breath.  · You fall or are in a car accident or have any kind of trauma.  · There is mental or physical violence in your home.  Document Released: 11/21/2001 Document Revised: 08/21/2012 Document Reviewed: 05/25/2009  [employer]® Patient Information ©2014 [employer].

## 2013-07-07 NOTE — Progress Notes (Signed)
22 y.o. y/o G1P0000 at [redacted]w[redacted]d weeks by R=6, here for ROB visit.  Plans to breast feed. Discussed quad screen Subjective:    Barbara Robertson is being seen today for her first obstetrical visit.  This is a planned pregnancy. She is at [redacted]w[redacted]d gestation. Her obstetrical history is significant for first trimester bleeding. Relationship with FOB: spouse, living together. Patient does intend to breast feed. Pregnancy history fully reviewed.  Menstrual History: OB History   Grav Para Term Preterm Abortions TAB SAB Ect Mult Living   1 0 0 0 0 0 0 0 0 0        Patient's last menstrual period was 05/04/2013.    The following portions of the patient's history were reviewed and updated as appropriate: allergies, current medications, past family history, past medical history, past social history, past surgical history and problem list.  Review of Systems Pertinent items are noted in HPI.    Objective:    BP 141/83  Temp(Src) 97.6 F (36.4 C)  Ht 6' (1.829 m)  Wt 214 lb 3.2 oz (97.16 kg)  BMI 29.04 kg/m2  LMP 05/04/2013  General Appearance:    Alert, cooperative, no distress, appears stated age  Head:    Normocephalic, without obvious abnormality, atraumatic  Eyes:    conjunctiva/corneas clear, EOM's intact, fundi    benign, both eyes     Nose:   Nares normal, septum midline, mucosa normal, no drainage      Throat:   Lips, mucosa, and tongue normal; teeth and gums normal  Neck:   Supple, symmetrical, trachea midline  Back:     Symmetric, no curvature, ROM normal, no CVA tenderness  Lungs:     Clear to auscultation bilaterally, respirations unlabored  Chest Wall:    No tenderness or deformity   Heart:    Regular rate and rhythm, S1 and S2 normal, no murmur, rub   or gallop     Abdomen:     Soft, non-tender, bowel sounds active all four quadrants,    no masses, no organomegaly  Genitalia:    Normal female without lesion, discharge or tenderness     Extremities:   Extremities normal,  atraumatic, no cyanosis or edema  Pulses:   2+ and symmetric all extremities  Skin:   Skin color, texture, turgor normal, no rashes or lesions     Neurologic:   CN grossly normal, normal strength, sensation     [place] confirmed SIUP living at 7+4d   Assessment:   [name] is a 22 y.o. G1P0000 at [redacted]w[redacted]d by R=6     Plan:    Initial labs drawn. Prenatal vitamins. Problem list reviewed and updated. AFP3 discussed: requested. Role of ultrasound in pregnancy discussed; fetal survey: requested. Amniocentesis discussed: not indicated. Follow up in 4 weeks. 50% of 40 min visit spent on counseling and coordination of care.   Elevated blood pressure at initial visit. Repeat: still elevated. Pt will collect 24hr urine and will need PIH labs collected.  Discussed with Patient: - All new OB labs ordered. - Physiologic changes of pregnancy/ Safe meds in pregnancy/Diet modifications, [email or web address] - Routine precautions (SAB, depression, infection s/s).  Patient provided with all pertinent phone numbers for emergencies. - Review Safety measures: seat belt and proper seatbelt application - Dating [place] ordered; Patient to schedule. -  Quad screen and [place] - RTC in 4 weeks for follow up.  To Do: 1. F/U US 2. Pap smear  [ ]  Vaccines: Flu:  Tdap:  [ ]  BCM:   Edu: [x ] PTL precautions; [x ] BF class; [ ]  childbirth class; [ ]   BF counseling

## 2013-07-07 NOTE — Progress Notes (Signed)
P=106, Here for Initial OB. Reports husband is carrier of Hepatitis C. Given new patient information. Discussed BMI and appropriate weight gain.

## 2013-07-07 NOTE — Progress Notes (Signed)
Ultrasound in Radiology at 10:15 am today.

## 2013-07-08 LAB — OBSTETRIC PANEL
Eosinophils Absolute: 0 10*3/uL (ref 0.0–0.7)
Eosinophils Relative: 0 % (ref 0–5)
HCT: 40.2 % (ref 36.0–46.0)
Hemoglobin: 14.1 g/dL (ref 12.0–15.0)
Lymphocytes Relative: 44 % (ref 12–46)
Lymphs Abs: 2.8 10*3/uL (ref 0.7–4.0)
MCH: 30.4 pg (ref 26.0–34.0)
MCV: 86.6 fL (ref 78.0–100.0)
Monocytes Absolute: 0.6 10*3/uL (ref 0.1–1.0)
Monocytes Relative: 9 % (ref 3–12)
Platelets: 440 10*3/uL — ABNORMAL HIGH (ref 150–400)
RBC: 4.64 MIL/uL (ref 3.87–5.11)
Rh Type: NEGATIVE
Rubella: 7.62 Index — ABNORMAL HIGH (ref <0.90)
WBC: 6.5 10*3/uL (ref 4.0–10.5)

## 2013-07-08 LAB — PRENATAL ANTIBODY IDENTIFICATION

## 2013-07-08 LAB — ANTIBODY TITER (PRENATAL TITER)

## 2013-07-09 ENCOUNTER — Encounter: Payer: Self-pay | Admitting: Obstetrics & Gynecology

## 2013-07-10 ENCOUNTER — Telehealth: Payer: Self-pay

## 2013-07-10 LAB — CREATININE CLEARANCE, URINE, 24 HOUR: Creatinine Clearance: 144 mL/min — ABNORMAL HIGH (ref 75–115)

## 2013-07-10 NOTE — Telephone Encounter (Signed)
Pt wanted results of her [place] she had on Monday.  Called pt and informed pt of her normal results, normal pregnancy. Pt stated understanding with no further questions.

## 2013-07-24 ENCOUNTER — Telehealth: Payer: Self-pay | Admitting: *Deleted

## 2013-07-24 NOTE — Telephone Encounter (Signed)
Called pt and left message on her personal voice mail that what she described in her message is normal and there is nothing she needs to do. If it becomes heavy or if she has abdominal or pelvic pain, she should go to [facility] for evaluation.

## 2013-07-24 NOTE — Telephone Encounter (Signed)
Patient left a message that after having sex last night she started having some light bleeding. This morning she is only spotting. She wanted to know if there is anything that she should do.

## 2013-08-04 ENCOUNTER — Encounter: Payer: Self-pay | Admitting: *Deleted

## 2013-08-04 ENCOUNTER — Encounter: Payer: Self-pay | Admitting: Obstetrics and Gynecology

## 2013-08-04 ENCOUNTER — Ambulatory Visit (INDEPENDENT_AMBULATORY_CARE_PROVIDER_SITE_OTHER): Payer: BC Managed Care – PPO | Admitting: Obstetrics and Gynecology

## 2013-08-04 VITALS — BP 132/83 | Temp 98.7°F | Wt 213.6 lb

## 2013-08-04 DIAGNOSIS — O360990 Maternal care for other rhesus isoimmunization, unspecified trimester, not applicable or unspecified: Secondary | ICD-10-CM

## 2013-08-04 DIAGNOSIS — Z3401 Encounter for supervision of normal first pregnancy, first trimester: Secondary | ICD-10-CM

## 2013-08-04 LAB — POCT URINALYSIS DIP (DEVICE)
Glucose, UA: NEGATIVE mg/dL
Hgb urine dipstick: NEGATIVE
Nitrite: NEGATIVE
Urobilinogen, UA: 0.2 mg/dL (ref 0.0–1.0)
pH: 7 (ref 5.0–8.0)

## 2013-08-04 NOTE — Patient Instructions (Signed)
Pregnancy - First Trimester  During sexual intercourse, millions of sperm go into the vagina. Only 1 sperm will penetrate and fertilize the female egg while it is in the Fallopian tube. One week later, the fertilized egg implants into the wall of the uterus. An embryo begins to develop into a baby. At 6 to 8 weeks, the eyes and face are formed and the heartbeat can be seen on ultrasound. At the end of 12 weeks (first trimester), all the baby's organs are formed. Now that you are pregnant, you will want to do everything you can to have a healthy baby. Two of the most important things are to get good prenatal care and follow your caregiver's instructions. Prenatal care is all the medical care you receive before the baby's birth. It is given to prevent, find, and treat problems during the pregnancy and childbirth.  PRENATAL EXAMS  · During prenatal visits, your weight, blood pressure, and urine are checked. This is done to make sure you are healthy and progressing normally during the pregnancy.  · A pregnant woman should gain 25 to 35 pounds during the pregnancy. However, if you are overweight or underweight, your caregiver will advise you regarding your weight.  · Your caregiver will ask and answer questions for you.  · Blood work, cervical cultures, other necessary tests, and a Pap test are done during your prenatal exams. These tests are done to check on your health and the probable health of your baby. Tests are strongly recommended and done for HIV with your permission. This is the virus that causes AIDS. These tests are done because medicines can be given to help prevent your baby from being born with this infection should you have been infected without knowing it. Blood work is also used to find out your blood type, previous infections, and follow your blood levels (hemoglobin).  · Low hemoglobin (anemia) is common during pregnancy. Iron and vitamins are given to help prevent this. Later in the pregnancy, blood  tests for diabetes will be done along with any other tests if any problems develop.  · You may need other tests to make sure you and the baby are doing well.  CHANGES DURING THE FIRST TRIMESTER   Your body goes through many changes during pregnancy. They vary from person to person. Talk to your caregiver about changes you notice and are concerned about. Changes can include:  · Your menstrual period stops.  · The egg and sperm carry the genes that determine what you look like. Genes from you and your partner are forming a baby. The female genes determine whether the baby is a boy or a girl.  · Your body increases in girth and you may feel bloated.  · Feeling sick to your stomach (nauseous) and throwing up (vomiting). If the vomiting is uncontrollable, call your caregiver.  · Your breasts will begin to enlarge and become tender.  · Your nipples may stick out more and become darker.  · The need to urinate more. Painful urination may mean you have a bladder infection.  · Tiring easily.  · Loss of appetite.  · Cravings for certain kinds of food.  · At first, you may gain or lose a couple of pounds.  · You may have changes in your emotions from day to day (excited to be pregnant or concerned something may go wrong with the pregnancy and baby).  · You may have more vivid and strange dreams.  HOME CARE INSTRUCTIONS   ·   It is very important to avoid all smoking, alcohol and non-prescribed drugs during your pregnancy. These affect the formation and growth of the baby. Avoid chemicals while pregnant to ensure the delivery of a healthy infant.  · Start your prenatal visits by the 12th week of pregnancy. They are usually scheduled monthly at first, then more often in the last 2 months before delivery. Keep your caregiver's appointments. Follow your caregiver's instructions regarding medicine use, blood and lab tests, exercise, and diet.  · During pregnancy, you are providing food for you and your baby. Eat regular, well-balanced  meals. Choose foods such as meat, fish, milk and other low fat dairy products, vegetables, fruits, and whole-grain breads and cereals. Your caregiver will tell you of the ideal weight gain.  · You can help morning sickness by keeping soda crackers at the bedside. Eat a couple before arising in the morning. You may want to use the crackers without salt on them.  · Eating 4 to 5 small meals rather than 3 large meals a day also may help the nausea and vomiting.  · Drinking liquids between meals instead of during meals also seems to help nausea and vomiting.  · A physical sexual relationship may be continued throughout pregnancy if there are no other problems. Problems may be early (premature) leaking of amniotic fluid from the membranes, vaginal bleeding, or belly (abdominal) pain.  · Exercise regularly if there are no restrictions. Check with your caregiver or physical therapist if you are unsure of the safety of some of your exercises. Greater weight gain will occur in the last 2 trimesters of pregnancy. Exercising will help:  · Control your weight.  · Keep you in shape.  · Prepare you for labor and delivery.  · Help you lose your pregnancy weight after you deliver your baby.  · Wear a good support or jogging bra for breast tenderness during pregnancy. This may help if worn during sleep too.  · Ask when prenatal classes are available. Begin classes when they are offered.  · Do not use hot tubs, steam rooms, or saunas.  · Wear your seat belt when driving. This protects you and your baby if you are in an accident.  · Avoid raw meat, uncooked cheese, cat litter boxes, and soil used by cats throughout the pregnancy. These carry germs that can cause birth defects in the baby.  · The first trimester is a good time to visit your dentist for your dental health. Getting your teeth cleaned is okay. Use a softer toothbrush and brush gently during pregnancy.  · Ask for help if you have financial, counseling, or nutritional needs  during pregnancy. Your caregiver will be able to offer counseling for these needs as well as refer you for other special needs.  · Do not take any medicines or herbs unless told by your caregiver.  · Inform your caregiver if there is any mental or physical domestic violence.  · Make a list of emergency phone numbers of family, friends, hospital, and police and fire departments.  · Write down your questions. Take them to your prenatal visit.  · Do not douche.  · Do not cross your legs.  · If you have to stand for long periods of time, rotate you feet or take small steps in a circle.  · You may have more vaginal secretions that may require a sanitary pad. Do not use tampons or scented sanitary pads.  MEDICINES AND DRUG USE IN PREGNANCY  ·   Take prenatal vitamins as directed. The vitamin should contain 1 milligram of folic acid. Keep all vitamins out of reach of children. Only a couple vitamins or tablets containing iron may be fatal to a baby or [place] child when ingested.  · Avoid use of all medicines, including herbs, over-the-counter medicines, not prescribed or suggested by your caregiver. Only take over-the-counter or prescription medicines for pain, discomfort, or fever as directed by your caregiver. Do not use aspirin, ibuprofen, or naproxen unless directed by your caregiver.  · Let your caregiver also know about herbs you may be using.  · Alcohol is related to a number of birth defects. This includes fetal alcohol syndrome. All alcohol, in any form, should be avoided completely. Smoking will cause low birth rate and premature babies.  · Street or illegal drugs are very harmful to the baby. They are absolutely forbidden. A baby born to an addicted mother will be addicted at birth. The baby will go through the same withdrawal an adult does.  · Let your caregiver know about any medicines that you have to take and for what reason you take them.  SEEK MEDICAL CARE IF:   You have any concerns or worries during your  pregnancy. It is better to call with your questions if you feel they cannot wait, rather than worry about them.  SEEK IMMEDIATE MEDICAL CARE IF:   · An unexplained oral temperature above 102° F (38.9° C) develops, or as your caregiver suggests.  · You have leaking of fluid from the vagina (birth canal). If leaking membranes are suspected, take your temperature and inform your caregiver of this when you call.  · There is vaginal spotting or bleeding. Notify your caregiver of the amount and how many pads are used.  · You develop a bad smelling vaginal discharge with a change in the color.  · You continue to feel sick to your stomach (nauseated) and have no relief from remedies suggested. You vomit blood or coffee ground-like materials.  · You lose more than 2 pounds of weight in 1 week.  · You gain more than 2 pounds of weight in 1 week and you notice swelling of your face, hands, feet, or legs.  · You gain 5 pounds or more in 1 week (even if you do not have swelling of your hands, face, legs, or feet).  · You get exposed to German measles and have never had them.  · You are exposed to fifth disease or chickenpox.  · You develop belly (abdominal) pain. Round ligament discomfort is a common non-cancerous (benign) cause of abdominal pain in pregnancy. Your caregiver still must evaluate this.  · You develop headache, fever, diarrhea, pain with urination, or shortness of breath.  · You fall or are in a car accident or have any kind of trauma.  · There is mental or physical violence in your home.  Document Released: 11/21/2001 Document Revised: 08/21/2012 Document Reviewed: 05/25/2009  [employer]® Patient Information ©2014 [employer].

## 2013-08-04 NOTE — Progress Notes (Signed)
Doing well. Plans quad screen next.

## 2013-08-04 NOTE — Progress Notes (Signed)
Pulse: 100

## 2013-08-26 ENCOUNTER — Ambulatory Visit (INDEPENDENT_AMBULATORY_CARE_PROVIDER_SITE_OTHER): Payer: BC Managed Care – PPO | Admitting: Advanced Practice Midwife

## 2013-08-26 VITALS — BP 143/82 | Temp 97.2°F | Wt 214.4 lb

## 2013-08-26 DIAGNOSIS — Z3402 Encounter for supervision of normal first pregnancy, second trimester: Secondary | ICD-10-CM

## 2013-08-26 DIAGNOSIS — Z3400 Encounter for supervision of normal first pregnancy, unspecified trimester: Secondary | ICD-10-CM

## 2013-08-26 LAB — POCT URINALYSIS DIP (DEVICE)
Leukocytes, UA: NEGATIVE
Nitrite: NEGATIVE
Protein, ur: NEGATIVE mg/dL
Urobilinogen, UA: 0.2 mg/dL (ref 0.0–1.0)
pH: 6.5 (ref 5.0–8.0)

## 2013-08-26 NOTE — Progress Notes (Signed)
[facility], baseline labs done. Will continue to monitor. AFP4 at next visit (only 14 weeks today), anatomy scan scheduled. FOB reports that on Sunday she "passed out". He caught her, and she did not hit her head. He states that she was only "out for a few seconds". She was fine afterward and has been since. Reviewed warning signs of the second trimester and advised to come to the hospital to be seen if it happens again.

## 2013-08-26 NOTE — Progress Notes (Signed)
Pulse: 102

## 2013-09-20 ENCOUNTER — Emergency Department (HOSPITAL_COMMUNITY)
Admission: EM | Admit: 2013-09-20 | Discharge: 2013-09-20 | Disposition: A | Discharge status: Home / Self Care | Payer: BC Managed Care – PPO | Attending: Emergency Medicine | Admitting: Emergency Medicine

## 2013-09-20 ENCOUNTER — Encounter (HOSPITAL_COMMUNITY): Payer: Self-pay | Admitting: Emergency Medicine

## 2013-09-20 DIAGNOSIS — Z8659 Personal history of other mental and behavioral disorders: Secondary | ICD-10-CM | POA: Insufficient documentation

## 2013-09-20 DIAGNOSIS — G8911 Acute pain due to trauma: Secondary | ICD-10-CM | POA: Insufficient documentation

## 2013-09-20 DIAGNOSIS — Z79899 Other long term (current) drug therapy: Secondary | ICD-10-CM | POA: Insufficient documentation

## 2013-09-20 DIAGNOSIS — Z8669 Personal history of other diseases of the nervous system and sense organs: Secondary | ICD-10-CM | POA: Insufficient documentation

## 2013-09-20 DIAGNOSIS — O9989 Other specified diseases and conditions complicating pregnancy, childbirth and the puerperium: Secondary | ICD-10-CM | POA: Insufficient documentation

## 2013-09-20 DIAGNOSIS — M79672 Pain in left foot: Secondary | ICD-10-CM

## 2013-09-20 DIAGNOSIS — M79609 Pain in unspecified limb: Secondary | ICD-10-CM | POA: Insufficient documentation

## 2013-09-20 HISTORY — DX: Encounter for supervision of normal pregnancy, unspecified, unspecified trimester: Z34.90

## 2013-09-20 NOTE — ED Notes (Signed)
Pt states left foot pain to top of foot. States old injury and pain to same area as past fx.

## 2013-09-20 NOTE — ED Notes (Signed)
Pt states she is [redacted] weeks pregnant.

## 2013-09-22 NOTE — ED Provider Notes (Signed)
CSN: [id number]     Arrival date & time 09/20/13  1628 History   First MD Initiated Contact with Patient 09/20/13 1831     Chief Complaint  Patient presents with  . Foot Pain   (Consider location/radiation/quality/duration/timing/severity/associated sxs/prior Treatment) HPI Comments: Patient who is [redacted] weeks pregnant, G1 P0 Ab0, c/o recurrent foot pain for several days.  Reports hx of previous injury to the left foot and has "flares" of pain occasionally.  She denies swelling, redness, numbness, proximal tenderness, fever, or chills.  She also denies any complications with her pregnancy.  She states she has not taken any medication for th epain and prefers not to take anything since she is pregnant.    Patient is a 22 y.o. female presenting with lower extremity pain. The history is provided by the patient.  Foot Pain This is a chronic problem. The current episode started more than 1 month ago. The problem occurs intermittently. The problem has been unchanged. Associated symptoms include arthralgias. Pertinent negatives include no abdominal pain, chills, fever, joint swelling, nausea, neck pain, numbness, rash or weakness. The symptoms are aggravated by bending, standing and walking. She has tried nothing for the symptoms. The treatment provided no relief.    Past Medical History  Diagnosis Date  . ADHD (attention deficit hyperactivity disorder)     not on meds  . Seizures     as a child, last sz 12 yrs ago  . Pregnancy    Past Surgical History  Procedure Laterality Date  . No past surgeries     Family History  Problem Relation Age of Onset  . Arthritis Father   . Cancer Maternal Grandmother   . Diabetes Maternal Grandmother   . Arthritis Mother   . Hypertension Mother    History  Substance Use Topics  . Smoking status: Never Smoker   . Smokeless tobacco: Never Used  . Alcohol Use: No   OB History   Grav Para Term Preterm Abortions TAB SAB Ect Mult Living   1 0 0 0 0 0 0 0 0  0     Review of Systems  Constitutional: Negative for fever and chills.  Gastrointestinal: Negative for nausea and abdominal pain.  Genitourinary: Negative for dysuria and difficulty urinating.  Musculoskeletal: Positive for arthralgias. Negative for joint swelling and neck pain.       Left foot pain  Skin: Negative for color change, rash and wound.  Neurological: Negative for weakness and numbness.  All other systems reviewed and are negative.    Allergies  Other  Home Medications   Current Outpatient Rx  Name  Route  Sig  Dispense  Refill  . Prenatal Vit-Fe Fumarate-FA (PRENATAL MULTIVITAMIN) TABS   Oral   Take 1 tablet by mouth daily at 12 noon.          BP 158/90  Pulse 108  Temp(Src) 98.9 F (37.2 C) (Oral)  Resp 16  Ht 6' (1.829 m)  Wt 214 lb (97.07 kg)  BMI 29.02 kg/m2  SpO2 97%  LMP 05/04/2013 Physical Exam  Nursing note and vitals reviewed. Constitutional: She is oriented to person, place, and time. She appears well-developed and well-nourished. No distress.  HENT:  Head: Normocephalic and atraumatic.  Cardiovascular: Normal rate, regular rhythm, normal heart sounds and intact distal pulses.   No murmur heard. Pulmonary/Chest: Effort normal and breath sounds normal. No respiratory distress.  Abdominal: Soft. There is no tenderness. There is no rebound and no guarding.  Pt is  gravid  Musculoskeletal: She exhibits tenderness. She exhibits no edema.  Localized ttp of the dorsal aspect of the left foot.  ROM is preserved.  DP pulse is brisk,distal sensation intact.  No erythema, abrasion, bruising or bony deformity.  No proximal tenderness.  Neurological: She is alert and oriented to person, place, and time. She exhibits normal muscle tone. Coordination normal.  Skin: Skin is warm and dry.    ED Course  Procedures (including critical care time) Labs Review Labs Reviewed - No data to display Imaging Review No results found.  EKG Interpretation    None       MDM   1. Foot pain, left    Pt with localized pain to dorsal foot, hx of previous foot injury.  No concerning sx's for cellulitis, no calf pain or edema, no ankle edema.  Pt prefers to not take medications, advised to elevate and ice the foot.  She denies any obstetrical complaints at this time    Post op shoe applied for comfort.  Pt agrees to f/u with ortho if needed.  She appears stable for discharge.      Cutberto Winfree L. Norvel Wenker, PA-C 09/22/13 2249

## 2013-09-23 ENCOUNTER — Ambulatory Visit (HOSPITAL_COMMUNITY)
Admission: RE | Admit: 2013-09-23 | Discharge: 2013-09-23 | Disposition: A | Discharge status: Home / Self Care | Payer: BC Managed Care – PPO | Source: Ambulatory Visit | Attending: Advanced Practice Midwife | Admitting: Advanced Practice Midwife

## 2013-09-23 ENCOUNTER — Ambulatory Visit (HOSPITAL_COMMUNITY): Admission: RE | Admit: 2013-09-23 | Payer: BC Managed Care – PPO | Source: Ambulatory Visit

## 2013-09-23 ENCOUNTER — Encounter: Payer: Self-pay | Admitting: *Deleted

## 2013-09-23 ENCOUNTER — Ambulatory Visit (INDEPENDENT_AMBULATORY_CARE_PROVIDER_SITE_OTHER): Payer: BC Managed Care – PPO | Admitting: Obstetrics & Gynecology

## 2013-09-23 ENCOUNTER — Encounter: Payer: Self-pay | Admitting: Obstetrics & Gynecology

## 2013-09-23 VITALS — BP 121/84 | Temp 98.8°F | Wt 218.2 lb

## 2013-09-23 DIAGNOSIS — Z3402 Encounter for supervision of normal first pregnancy, second trimester: Secondary | ICD-10-CM

## 2013-09-23 DIAGNOSIS — Z3689 Encounter for other specified antenatal screening: Secondary | ICD-10-CM | POA: Insufficient documentation

## 2013-09-23 DIAGNOSIS — Z23 Encounter for immunization: Secondary | ICD-10-CM

## 2013-09-23 DIAGNOSIS — G40909 Epilepsy, unspecified, not intractable, without status epilepticus: Secondary | ICD-10-CM | POA: Insufficient documentation

## 2013-09-23 DIAGNOSIS — Z3400 Encounter for supervision of normal first pregnancy, unspecified trimester: Secondary | ICD-10-CM

## 2013-09-23 DIAGNOSIS — E669 Obesity, unspecified: Secondary | ICD-10-CM | POA: Insufficient documentation

## 2013-09-23 DIAGNOSIS — O99210 Obesity complicating pregnancy, unspecified trimester: Secondary | ICD-10-CM | POA: Insufficient documentation

## 2013-09-23 NOTE — Patient Instructions (Signed)
Breastfeeding A change in hormones during your pregnancy causes growth of your breast tissue and an increase in number and size of milk ducts. The hormone prolactin allows proteins, sugars, and fats from your blood supply to make breast milk in your milk-producing glands. The hormone progesterone prevents breast milk from being released before the birth of your baby. After the birth of your baby, your progesterone level decreases allowing breast milk to be released. Thoughts of your baby, as well as his or her sucking or crying, can stimulate the release of milk from the milk-producing glands. Deciding to breastfeed (nurse) is one of the best choices you can make for you and your baby. The information that follows gives a brief review of the benefits, as well as other important skills to know about breastfeeding. BENEFITS OF BREASTFEEDING For your baby  The first milk (colostrum) helps your baby's digestive system function better.   There are antibodies in your milk that help your baby fight off infections.   Your baby has a lower incidence of asthma, allergies, and sudden infant death syndrome (SIDS).   The nutrients in breast milk are better for your baby than infant formulas.  Breast milk improves your baby's brain development.   Your baby will have less gas, colic, and constipation.  Your baby is less likely to develop other conditions, such as childhood obesity, asthma, or diabetes mellitus. For you  Breastfeeding helps develop a very special bond between you and your baby.   Breastfeeding is convenient, always available at the correct temperature, and costs nothing.   Breastfeeding helps to burn calories and helps you lose the weight gained during pregnancy.   Breastfeeding makes your uterus contract back down to normal size faster and slows bleeding following delivery.   Breastfeeding mothers have a lower risk of developing osteoporosis or breast or ovarian cancer later  in life.  BREASTFEEDING FREQUENCY  A healthy, full-term baby may breastfeed as often as every hour or space his or her feedings to every 3 hours. Breastfeeding frequency will vary from baby to baby.   Newborns should be fed no less than every 2 3 hours during the day and every 4 5 hours during the night. You should breastfeed a minimum of 8 feedings in a 24 hour period.  Awaken your baby to breastfeed if it has been 3 4 hours since the last feeding.  Breastfeed when you feel the need to reduce the fullness of your breasts or when your newborn shows signs of hunger. Signs that your baby may be hungry include:  Increased alertness or activity.  Stretching.  Movement of the head from side to side.  Movement of the head and opening of the mouth when the corner of the mouth or cheek is stroked (rooting).  Increased sucking sounds, smacking lips, cooing, sighing, or squeaking.  Hand-to-mouth movements.  Increased sucking of fingers or hands.  Fussing.  Intermittent crying.  Signs of extreme hunger will require calming and consoling before you try to feed your baby. Signs of extreme hunger may include:  Restlessness.  A loud, strong cry.  Screaming.  Frequent feeding will help you make more milk and will help prevent problems, such as sore nipples and engorgement of the breasts.  BREASTFEEDING   Whether lying down or sitting, be sure that the baby's abdomen is facing your abdomen.   Support your breast with 4 fingers under your breast and your thumb above your nipple. Make sure your fingers are well away from   your nipple and your baby's mouth.   Stroke your baby's lips gently with your finger or nipple.   When your baby's mouth is open wide enough, place all of your nipple and as much of the colored area around your nipple (areola) as possible into your baby's mouth.  More areola should be visible above his or her upper lip than below his or her lower lip.  Your  baby's tongue should be between his or her lower gum and your breast.  Ensure that your baby's mouth is correctly positioned around the nipple (latched). Your baby's lips should create a seal on your breast.  Signs that your baby has effectively latched onto your nipple include:  Tugging or sucking without pain.  Swallowing heard between sucks.  Absent click or smacking sound.  Muscle movement above and in front of his or her ears with sucking.  Your baby must suck about 2 3 minutes in order to get your milk. Allow your baby to feed on each breast as long as he or she wants. Nurse your baby until he or she unlatches or falls asleep at the first breast, then offer the second breast.  Signs that your baby is full and satisfied include:  A gradual decrease in the number of sucks or complete cessation of sucking.  Falling asleep.  Extension or relaxation of his or her body.  Retention of a small amount of milk in his or her mouth.  Letting go of your breast by himself or herself.  Signs of effective breastfeeding in you include:  Breasts that have increased firmness, weight, and size prior to feeding.  Breasts that are softer after nursing.  Increased milk volume, as well as a change in milk consistency and color by the 5th day of breastfeeding.  Breast fullness relieved by breastfeeding.  Nipples are not sore, cracked, or bleeding.  If needed, break the suction by putting your finger into the corner of your baby's mouth and sliding your finger between his or her gums. Then, remove your breast from his or her mouth.  It is common for babies to spit up a small amount after a feeding.  Babies often swallow air during feeding. This can make babies fussy. Burping your baby between breasts can help with this.  Vitamin D supplements are recommended for babies who get only breast milk.  Avoid using a pacifier during your baby's first 4 6 weeks.  Avoid supplemental feedings of  water, formula, or juice in place of breastfeeding. Breast milk is all the food your baby needs. It is not necessary for your baby to have water or formula. Your breasts will make more milk if supplemental feedings are avoided during the early weeks. HOW TO TELL WHETHER YOUR BABY IS GETTING ENOUGH BREAST MILK Wondering whether or not your baby is getting enough milk is a common concern among mothers. You can be assured that your baby is getting enough milk if:   Your baby is actively sucking and you hear swallowing.   Your baby seems relaxed and satisfied after a feeding.   Your baby nurses at least 8 12 times in a 24 hour time period.  During the first 3 5 days of age:  Your baby is wetting at least 3 5 diapers in a 24 hour period. The urine should be clear and pale yellow.  Your baby is having at least 3 4 stools in a 24 hour period. The stool should be soft and yellow.  At   5 7 days of age, your baby is having at least 3 6 stools in a 24 hour period. The stool should be seedy and yellow by 5 days of age.  Your baby has a weight loss less than 7 10% during the first 3 days of age.  Your baby does not lose weight after 3 7 days of age.  Your baby gains 4 7 ounces each week after he or she is 4 days of age.  Your baby gains weight by 5 days of age and is back to birth weight within 2 weeks. ENGORGEMENT In the first week after your baby is born, you may experience extremely full breasts (engorgement). When engorged, your breasts may feel heavy, warm, or tender to the touch. Engorgement peaks within 24 48 hours after delivery of your baby.  Engorgement may be reduced by:  Continuing to breastfeed.  Increasing the frequency of breastfeeding.  Taking warm showers or applying warm, moist heat to your breasts just before each feeding. This increases circulation and helps the milk flow.   Gently massaging your breast before and during the feedings. With your fingertips, massage from  your chest wall towards your nipple in a circular motion.   Ensuring that your baby empties at least one breast at every feeding. It also helps to start the next feeding on the opposite breast.   Expressing breast milk by hand or by using a breast pump to empty the breasts if your baby is sleepy, or not nursing well. You may also want to express milk if you are returning to work oryou feel you are getting engorged.  Ensuring your baby is latched on and positioned properly while breastfeeding. If you follow these suggestions, your engorgement should improve in 24 48 hours. If you are still experiencing difficulty, call your lactation consultant or caregiver.  CARING FOR YOURSELF Take care of your breasts.  Bathe or shower daily.   Avoid using soap on your nipples.   Wear a supportive bra. Avoid wearing underwire style bras.  Air dry your nipples for a 3 4minutes after each feeding.   Use only cotton bra pads to absorb breast milk leakage. Leaking of breast milk between feedings is normal.   Use only pure lanolin on your nipples after nursing. You do not need to wash it off before feeding your baby again. Another option is to express a few drops of breast milk and gently massage that milk into your nipples.  Continue breast self-awareness checks. Take care of yourself.  Eat healthy foods. Alternate 3 meals with 3 snacks.  Avoid foods that you notice affect your baby in a bad way.  Drink milk, fruit juice, and water to satisfy your thirst (about 8 glasses a day).   Rest often, relax, and take your prenatal vitamins to prevent fatigue, stress, and anemia.  Avoid chewing and smoking tobacco.  Avoid alcohol and drug use.  Take over-the-counter and prescribed medicine only as directed by your caregiver or pharmacist. You should always check with your caregiver or pharmacist before taking any new medicine, vitamin, or herbal supplement.  Know that pregnancy is possible while  breastfeeding. If desired, talk to your caregiver about family planning and safe birth control methods that may be used while breastfeeding. SEEK MEDICAL CARE IF:   You feel like you want to stop breastfeeding or have become frustrated with breastfeeding.  You have painful breasts or nipples.  Your nipples are cracked or bleeding.  Your breasts are red, tender,   or warm.  You have a swollen area on either breast.  You have a fever or chills.  You have nausea or vomiting.  You have drainage from your nipples.  Your breasts do not become full before feedings by the 5th day after delivery.  You feel sad and depressed.  Your baby is too sleepy to eat well.  Your baby is having trouble sleeping.   Your baby is wetting less than 3 diapers in a 24 hour period.  Your baby has less than 3 stools in a 24 hour period.  Your baby's skin or the white part of his or her eyes becomes more yellow.   Your baby is not gaining weight by 5 days of age. MAKE SURE YOU:   Understand these instructions.  Will watch your condition.  Will get help right away if you are not doing well or get worse. Document Released: 11/27/2005 Document Revised: 08/21/2012 Document Reviewed: 07/03/2012 [employer] Patient Information 2014 [employer].  

## 2013-09-23 NOTE — Progress Notes (Signed)
P - 108 

## 2013-09-23 NOTE — Progress Notes (Signed)
Routine visit. Quad screen and anatomy u/s and flu vaccine today. Early glucola at next visit. (She can't stay today).

## 2013-09-24 LAB — AFP, QUAD SCREEN
AFP: 18.9 IU/mL
Curr Gest Age: 18.5 wks.days
INH: 133.9 pg/mL
MoM for AFP: 0.55
Trisomy 18 (Edward) Syndrome Interp.: 1:19900 {titer}
uE3 Value: 1 ng/mL

## 2013-09-24 LAB — POCT URINALYSIS DIP (DEVICE)
Bilirubin Urine: NEGATIVE
Hgb urine dipstick: NEGATIVE
Ketones, ur: NEGATIVE mg/dL
Leukocytes, UA: NEGATIVE
pH: 7 (ref 5.0–8.0)

## 2013-09-25 NOTE — ED Provider Notes (Signed)
Medical screening examination/treatment/procedure(s) were performed by non-physician practitioner and as supervising physician I was immediately available for consultation/collaboration.  Juliet Rude. Rubin Payor, MD 09/25/13 1410

## 2013-10-07 ENCOUNTER — Encounter: Payer: BC Managed Care – PPO | Admitting: Obstetrics and Gynecology

## 2013-10-21 ENCOUNTER — Ambulatory Visit (INDEPENDENT_AMBULATORY_CARE_PROVIDER_SITE_OTHER): Payer: BC Managed Care – PPO | Admitting: Obstetrics & Gynecology

## 2013-10-21 VITALS — BP 144/82 | Temp 97.9°F | Wt 224.0 lb

## 2013-10-21 DIAGNOSIS — Z3402 Encounter for supervision of normal first pregnancy, second trimester: Secondary | ICD-10-CM

## 2013-10-21 DIAGNOSIS — O162 Unspecified maternal hypertension, second trimester: Secondary | ICD-10-CM

## 2013-10-21 DIAGNOSIS — O139 Gestational [pregnancy-induced] hypertension without significant proteinuria, unspecified trimester: Secondary | ICD-10-CM

## 2013-10-21 LAB — POCT URINALYSIS DIP (DEVICE)
Bilirubin Urine: NEGATIVE
Ketones, ur: NEGATIVE mg/dL
pH: 7 (ref 5.0–8.0)

## 2013-10-21 NOTE — Patient Instructions (Signed)
Glucose Tolerance Test During Pregnancy  The glucose tolerance test (GTT) or 3-hour glucose test can be used to determine if a woman has diabetes that first begins or is first recognized during pregnancy (gestational diabetes). Typically, a GTT is done after you have had a 1-hour glucose test with results that indicate you possibly have gestational diabetes.   The test takes about 3 hours. There will be a series of blood tests after you drink the sugar water solution. You must remain at the testing location to make sure that your blood is drawn on time.   LET YOUR CAREGIVER KNOW ABOUT:  · Allergies to food or medicine.  · Medicines taken, including vitamins, herbs, eyedrops, over-the-counter medicines, and creams.  · Any recent illnesses or infections.  BEFORE THE PROCEDURE  The GTT is a fasting test, meaning you must stop eating for a certain amount of time. The test will be the most accurate if you have not eaten for 8 12 hours before the test. For this reason, it is recommended that you have this test done in the morning before you have breakfast.  PROCEDURE   Do not eat or drink anything but water during the test. When you arrive at the lab, a sample of your blood is taken to get your fasting blood glucose level. After your fasting glucose level is determined, you will be given a sugar water solution to drink. You will be asked to wait in a certain area until your next blood test. The blood tests are done each hour for 3 hours. Stay close to the lab so your blood samples can be taken on time. This is important. If the blood samples are not taken on time, the test will need to be done again on another day.   AFTER THE PROCEDURE  · You can eat and drink as usual.    · Ask when your test results will be ready. Make sure you get your test results. A positive test is considered when two of the four blood test values are equal or above the normal blood glucose level.  Document Released: 05/28/2012 Document Reviewed:  05/28/2012  Exit[employer]® Patient Information ©2014 [employer].

## 2013-10-21 NOTE — Progress Notes (Signed)
Discussed with pt elevated BP and the potential complications later in pregnancy.  She has had no seizures for >13years and has not been on meds for >10years. +FM, NO VB, no LOF  1 hr GTT today

## 2013-10-21 NOTE — Progress Notes (Signed)
Pulse: 86

## 2013-10-22 ENCOUNTER — Encounter: Payer: Self-pay | Admitting: Obstetrics & Gynecology

## 2013-11-18 ENCOUNTER — Ambulatory Visit (INDEPENDENT_AMBULATORY_CARE_PROVIDER_SITE_OTHER): Payer: BC Managed Care – PPO | Admitting: Family Medicine

## 2013-11-18 ENCOUNTER — Encounter: Payer: Self-pay | Admitting: Family Medicine

## 2013-11-18 VITALS — BP 124/60 | Temp 97.3°F | Wt 225.6 lb

## 2013-11-18 DIAGNOSIS — Z3400 Encounter for supervision of normal first pregnancy, unspecified trimester: Secondary | ICD-10-CM

## 2013-11-18 DIAGNOSIS — Z3402 Encounter for supervision of normal first pregnancy, second trimester: Secondary | ICD-10-CM

## 2013-11-18 LAB — POCT URINALYSIS DIP (DEVICE)
Bilirubin Urine: NEGATIVE
Glucose, UA: NEGATIVE mg/dL
Hgb urine dipstick: NEGATIVE
Ketones, ur: NEGATIVE mg/dL
Leukocytes, UA: NEGATIVE
Nitrite: NEGATIVE
Protein, ur: NEGATIVE mg/dL
Specific Gravity, Urine: <=1.005 (ref 1.005–1.030)
Urobilinogen, UA: 0.2 mg/dL (ref 0.0–1.0)
pH: 6 (ref 5.0–8.0)

## 2013-11-18 NOTE — Progress Notes (Signed)
P=88 

## 2013-11-18 NOTE — Progress Notes (Signed)
+  FM no lof no vb no ctx  Barbara Robertson is a 22 y.o. G1P0000 at [redacted]w[redacted]d by R=7 here for ROB visit.    BP Good today Discussed with Patient:  -Plans to breast feed.  All questions answered. -Continue prenatal vitamins. - Reviewed genetics screen  -Reviewed fetal kick counts (Pt to perform daily at a time when the baby is active, lie laterally with both hands on belly in quiet room and count all movements (hiccups, shoulder rolls, obvious kicks, etc); pt is to report to clinic or MAU for less than 10 movements felt in a one hour time period-pt told as soon as she counts 10 movements the count is complete.)  - Routine precautions discussed (depression, infection s/s).   Patient provided with all pertinent phone numbers for emergencies. - RTC for any VB, regular, painful cramps/ctxs occurring at a rate of >2/10 min, fever (100.5 or higher), n/v/d, any pain that is unresolving or worsening, LOF, decreased fetal movement, CP, SOB, edema  Problems: Patient Active Problem List   Diagnosis Date Noted  . Obesity in pregnancy 09/23/2013  . 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 Elevated blood pressure 07/07/2013  . Supervision of normal first pregnancy 06/23/2013    To Do: 1. Glucose tolerance test 2 weeks.  Patient will draw in clinic.  Will f/u test and amend plan based on results. 2. CBC and antibody screen ordered. 3. Rhogam given in 2 weeks(if Rh-)  [ ]  Vaccines: Flu: recd Tdap:  [ ]  BCM: POP/OCP  Edu: [x ] PTL precautions; [ ]  BF class; [ ]  childbirth class; [ ]   BF counseling;

## 2013-11-18 NOTE — Patient Instructions (Signed)
Second Trimester of Pregnancy The second trimester is from week 13 through week 28, months 4 through 6. The second trimester is often a time when you feel your best. Your body has also adjusted to being pregnant, and you begin to feel better physically. Usually, morning sickness has lessened or quit completely, you may have more energy, and you may have an increase in appetite. The second trimester is also a time when the fetus is growing rapidly. At the end of the sixth month, the fetus is about 9 inches long and weighs about 1 pounds. You will likely begin to feel the baby move (quickening) between 18 and 20 weeks of the pregnancy. BODY CHANGES Your body goes through many changes during pregnancy. The changes vary from woman to woman.   Your weight will continue to increase. You will notice your lower abdomen bulging out.  You may begin to get stretch marks on your hips, abdomen, and breasts.  You may develop headaches that can be relieved by medicines approved by your caregiver.  You may urinate more often because the fetus is pressing on your bladder.  You may develop or continue to have heartburn as a result of your pregnancy.  You may develop constipation because certain hormones are causing the muscles that push waste through your intestines to slow down.  You may develop hemorrhoids or swollen, bulging veins (varicose veins).  You may have back pain because of the weight gain and pregnancy hormones relaxing your joints between the bones in your pelvis and as a result of a shift in weight and the muscles that support your balance.  Your breasts will continue to grow and be tender.  Your gums may bleed and may be sensitive to brushing and flossing.  Dark spots or blotches (chloasma, mask of pregnancy) may develop on your face. This will likely fade after the baby is born.  A dark line from your belly button to the pubic area (linea nigra) may appear. This will likely fade after the  baby is born. WHAT TO EXPECT AT YOUR PRENATAL VISITS During a routine prenatal visit:  You will be weighed to make sure you and the fetus are growing normally.  Your blood pressure will be taken.  Your abdomen will be measured to track your baby's growth.  The fetal heartbeat will be listened to.  Any test results from the previous visit will be discussed. Your caregiver may ask you:  How you are feeling.  If you are feeling the baby move.  If you have had any abnormal symptoms, such as leaking fluid, bleeding, severe headaches, or abdominal cramping.  If you have any questions. Other tests that may be performed during your second trimester include:  Blood tests that check for:  Low iron levels (anemia).  Gestational diabetes (between 24 and 28 weeks).  Rh antibodies.  Urine tests to check for infections, diabetes, or protein in the urine.  An ultrasound to confirm the proper growth and development of the baby.  An amniocentesis to check for possible genetic problems.  Fetal screens for spina bifida and Down syndrome. HOME CARE INSTRUCTIONS   Avoid all smoking, herbs, alcohol, and unprescribed drugs. These chemicals affect the formation and growth of the baby.  Follow your caregiver's instructions regarding medicine use. There are medicines that are either safe or unsafe to take during pregnancy.  Exercise only as directed by your caregiver. Experiencing uterine cramps is a good sign to stop exercising.  Continue to eat regular,   healthy meals.  Wear a good support bra for breast tenderness.  Do not use hot tubs, steam rooms, or saunas.  Wear your seat belt at all times when driving.  Avoid raw meat, uncooked cheese, cat litter boxes, and soil used by cats. These carry germs that can cause birth defects in the baby.  Take your prenatal vitamins.  Try taking a stool softener (if your caregiver approves) if you develop constipation. Eat more high-fiber foods,  such as fresh vegetables or fruit and whole grains. Drink plenty of fluids to keep your urine clear or pale yellow.  Take warm sitz baths to soothe any pain or discomfort caused by hemorrhoids. Use hemorrhoid cream if your caregiver approves.  If you develop varicose veins, wear support hose. Elevate your feet for 15 minutes, 3 4 times a day. Limit salt in your diet.  Avoid heavy lifting, wear low heel shoes, and practice good posture.  Rest with your legs elevated if you have leg cramps or low back pain.  Visit your dentist if you have not gone yet during your pregnancy. Use a soft toothbrush to brush your teeth and be gentle when you floss.  A sexual relationship may be continued unless your caregiver directs you otherwise.  Continue to go to all your prenatal visits as directed by your caregiver. SEEK MEDICAL CARE IF:   You have dizziness.  You have mild pelvic cramps, pelvic pressure, or nagging pain in the abdominal area.  You have persistent nausea, vomiting, or diarrhea.  You have a bad smelling vaginal discharge.  You have pain with urination. SEEK IMMEDIATE MEDICAL CARE IF:   You have a fever.  You are leaking fluid from your vagina.  You have spotting or bleeding from your vagina.  You have severe abdominal cramping or pain.  You have rapid weight gain or loss.  You have shortness of breath with chest pain.  You notice sudden or extreme swelling of your face, hands, ankles, feet, or legs.  You have not felt your baby move in over an hour.  You have severe headaches that do not go away with medicine.  You have vision changes. Document Released: 11/21/2001 Document Revised: 07/30/2013 Document Reviewed: 01/28/2013 Exit[employer] Patient Information 2014 [employer].  

## 2013-12-02 ENCOUNTER — Ambulatory Visit (INDEPENDENT_AMBULATORY_CARE_PROVIDER_SITE_OTHER): Payer: BC Managed Care – PPO | Admitting: *Deleted

## 2013-12-02 ENCOUNTER — Ambulatory Visit (HOSPITAL_COMMUNITY)
Admission: RE | Admit: 2013-12-02 | Discharge: 2013-12-02 | Disposition: A | Discharge status: Home / Self Care | Payer: BC Managed Care – PPO | Source: Ambulatory Visit | Attending: Family Medicine | Admitting: Family Medicine

## 2013-12-02 DIAGNOSIS — Z3402 Encounter for supervision of normal first pregnancy, second trimester: Secondary | ICD-10-CM

## 2013-12-02 DIAGNOSIS — Z3403 Encounter for supervision of normal first pregnancy, third trimester: Secondary | ICD-10-CM

## 2013-12-02 DIAGNOSIS — O360990 Maternal care for other rhesus isoimmunization, unspecified trimester, not applicable or unspecified: Secondary | ICD-10-CM

## 2013-12-02 DIAGNOSIS — Z3689 Encounter for other specified antenatal screening: Secondary | ICD-10-CM | POA: Diagnosis present

## 2013-12-02 DIAGNOSIS — Z23 Encounter for immunization: Secondary | ICD-10-CM

## 2013-12-02 LAB — CBC
Hemoglobin: 13.2 g/dL (ref 12.0–15.0)
MCH: 30.9 pg (ref 26.0–34.0)
MCHC: 34.3 g/dL (ref 30.0–36.0)
Platelets: 326 10*3/uL (ref 150–400)

## 2013-12-02 MED ORDER — RHO D IMMUNE GLOBULIN 1500 UNIT/2ML IJ SOLN
300.0000 ug | Freq: Once | INTRAMUSCULAR | Status: AC
  Administered 2013-12-02: 300 ug via INTRAMUSCULAR

## 2013-12-02 MED ORDER — TETANUS-DIPHTH-ACELL PERTUSSIS 5-2.5-18.5 LF-MCG/0.5 IM SUSP
0.5000 mL | Freq: Once | INTRAMUSCULAR | Status: AC
  Administered 2013-12-02: 0.5 mL via INTRAMUSCULAR

## 2013-12-03 LAB — HIV ANTIBODY (ROUTINE TESTING W REFLEX): HIV: NONREACTIVE

## 2013-12-03 LAB — SYPHILIS: RPR W/REFLEX TO RPR TITER AND TREPONEMAL ANTIBODIES, TRADITIONAL SCREENING AND DIAGNOSIS ALGORITHM

## 2013-12-03 LAB — ANTIBODY SCREEN: Antibody Screen: NEGATIVE

## 2013-12-05 ENCOUNTER — Encounter: Payer: Self-pay | Admitting: Family Medicine

## 2013-12-08 ENCOUNTER — Telehealth: Payer: Self-pay | Admitting: *Deleted

## 2013-12-08 NOTE — Telephone Encounter (Signed)
Message copied by Dorothyann Peng on Mon Dec 08, 2013 10:00 AM ------      Message from: Adam Phenix      Created: Wed Dec 03, 2013  8:41 AM       Schedule 3 hr GTT ------

## 2013-12-08 NOTE — Telephone Encounter (Signed)
Attempted to call patient to inform of 3 hour Gtt scheduled for 12/09/2013 @ 0800.  Message left on provided mobile number, man on answering machine.

## 2013-12-08 NOTE — Telephone Encounter (Signed)
Spoke with pt concerning appt Tuesday 12/30 and its significance. Pt verbalizes understanding. Instructions given.

## 2013-12-09 ENCOUNTER — Other Ambulatory Visit: Payer: BC Managed Care – PPO

## 2013-12-09 DIAGNOSIS — O99810 Abnormal glucose complicating pregnancy: Secondary | ICD-10-CM

## 2013-12-09 DIAGNOSIS — Z3403 Encounter for supervision of normal first pregnancy, third trimester: Secondary | ICD-10-CM

## 2013-12-09 LAB — GLUCOSE TOLERANCE, 3 HOURS
Glucose Tolerance, 2 hour: 96 mg/dL (ref 70–164)
Glucose, GTT - 3 Hour: 65 mg/dL — ABNORMAL LOW (ref 70–144)

## 2013-12-11 NOTE — L&D Delivery Note (Signed)
Delivery Note At 12:55 PM a healthy female was delivered precipitously via Vaginal, Spontaneous Delivery by RN while CNM/resident in route to the room. Anesthesia MD at bedside at time of delivery.  (Presentation: Left Occiput Anterior).  APGAR: , ; weight .   Placenta status: intact.  Cord: 3 vessels with the following complications: None.  Cord pH: obtained  Anesthesia: Epidural  Episiotomy: None Lacerations: None Suture Repair: na Est. Blood Loss 250 (mL):   Mom to postpartum.  Baby to Couplet care / Skin to Skin.  Wenda LowJoyner, James 01/30/2014, 1:15 PM I have seen the patient with the resident/student and agree with the above.  Tawnya CrookHogan, Tamea Bai Donovan

## 2013-12-11 NOTE — L&D Delivery Note (Signed)
Attestation of Attending Supervision of Advanced Practitioner (PA/CNM/NP): Evaluation and management procedures were performed by the Advanced Practitioner under my supervision and collaboration.  I have reviewed the Advanced Practitioner's note and chart, and I agree with the management and plan.  Terasa Orsini, MD, FACOG Attending Obstetrician & Gynecologist Faculty Practice, [facility]  

## 2013-12-17 ENCOUNTER — Encounter: Payer: Self-pay | Admitting: Family Medicine

## 2013-12-17 ENCOUNTER — Encounter: Payer: Self-pay | Admitting: *Deleted

## 2013-12-17 ENCOUNTER — Ambulatory Visit (INDEPENDENT_AMBULATORY_CARE_PROVIDER_SITE_OTHER): Payer: BC Managed Care – PPO | Admitting: Family Medicine

## 2013-12-17 VITALS — BP 128/86 | Temp 97.8°F | Wt 232.8 lb

## 2013-12-17 DIAGNOSIS — O350XX1 Maternal care for (suspected) central nervous system malformation in fetus, fetus 1: Secondary | ICD-10-CM

## 2013-12-17 DIAGNOSIS — IMO0002 Reserved for concepts with insufficient information to code with codable children: Secondary | ICD-10-CM | POA: Insufficient documentation

## 2013-12-17 DIAGNOSIS — O360990 Maternal care for other rhesus isoimmunization, unspecified trimester, not applicable or unspecified: Secondary | ICD-10-CM

## 2013-12-17 DIAGNOSIS — Z3403 Encounter for supervision of normal first pregnancy, third trimester: Secondary | ICD-10-CM

## 2013-12-17 DIAGNOSIS — IMO0001 Reserved for inherently not codable concepts without codable children: Secondary | ICD-10-CM

## 2013-12-17 DIAGNOSIS — O3500X0 Maternal care for (suspected) central nervous system malformation or damage in fetus, unspecified, not applicable or unspecified: Secondary | ICD-10-CM

## 2013-12-17 DIAGNOSIS — O350XX0 Maternal care for (suspected) central nervous system malformation in fetus, not applicable or unspecified: Secondary | ICD-10-CM

## 2013-12-17 LAB — POCT URINALYSIS DIP (DEVICE)
Bilirubin Urine: NEGATIVE
GLUCOSE, UA: NEGATIVE mg/dL
HGB URINE DIPSTICK: NEGATIVE
Ketones, ur: NEGATIVE mg/dL
Nitrite: NEGATIVE
PH: 5.5 (ref 5.0–8.0)
Protein, ur: NEGATIVE mg/dL
UROBILINOGEN UA: 0.2 mg/dL (ref 0.0–1.0)

## 2013-12-17 NOTE — Progress Notes (Signed)
+  FM, no lof, no vb, no ctx  Barbara Robertson is a 23 y.o. G1P0000 at 7055w6d here for ROB visit. Reeval ventriculmegaly Fetal echo  Discussed with Patient:  -Plans to  breast feed.  All questions answered. -Continue prenatal vitamins. -Reviewed fetal kick counts Pt to perform daily at a time when the baby is active, lie laterally with both hands on belly in quiet room and count all movements (hiccups, shoulder rolls, obvious kicks, etc); pt is to report to clinic L&D for less than 10 movements felt in a one hour time period-pt told as soon as she counts 10 movements the count is complete.  - Routine precautions discussed (depression, infection s/s).   Patient provided with all pertinent phone numbers for emergencies. - RTC for any VB, regular, painful cramps/ctxs occurring at a rate of >2/10 min, fever (100.5 or higher), n/v/d, any pain that is unresolving or worsening, LOF, decreased fetal movement, CP, SOB, edema - RTC in 2 weeks for next appt.   Problems: Patient Active Problem List   Diagnosis Date Noted  . Cerebral ventriculomegaly of fetus 12/17/2013  . Obesity in pregnancy 09/23/2013  . 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 Elevated blood pressure 07/07/2013  . Supervision of normal first pregnancy 06/23/2013    To Do: 1. Repeat us  [ ]  Vaccines: WUJ:WJXBFlu:recd  Tdap: recd [ ]  BCM: OCP/POP [ ]  Readiness: baby has a place to sleep, car seat, other baby necessities.  Edu: [x ] PTL precautions; [ ]  BF class; [ ]  childbirth class; [ ]   BF counseling;

## 2013-12-17 NOTE — Progress Notes (Signed)
Pulse 118 

## 2013-12-17 NOTE — Patient Instructions (Signed)
Third Trimester of Pregnancy  The third trimester is from week 29 through week 42, months 7 through 9. The third trimester is a time when the fetus is growing rapidly. At the end of the ninth month, the fetus is about 20 inches in length and weighs 6 10 pounds.   BODY CHANGES  Your body goes through many changes during pregnancy. The changes vary from woman to woman.    Your weight will continue to increase. You can expect to gain 25 35 pounds (11 16 kg) by the end of the pregnancy.   You may begin to get stretch marks on your hips, abdomen, and breasts.   You may urinate more often because the fetus is moving lower into your pelvis and pressing on your bladder.   You may develop or continue to have heartburn as a result of your pregnancy.   You may develop constipation because certain hormones are causing the muscles that push waste through your intestines to slow down.   You may develop hemorrhoids or swollen, bulging veins (varicose veins).   You may have pelvic pain because of the weight gain and pregnancy hormones relaxing your joints between the bones in your pelvis. Back aches may result from over exertion of the muscles supporting your posture.   Your breasts will continue to grow and be tender. A yellow discharge may leak from your breasts called colostrum.   Your belly button may stick out.   You may feel short of breath because of your expanding uterus.   You may notice the fetus "dropping," or moving lower in your abdomen.   You may have a bloody mucus discharge. This usually occurs a few days to a week before labor begins.   Your cervix becomes thin and soft (effaced) near your due date.  WHAT TO EXPECT AT YOUR PRENATAL EXAMS   You will have prenatal exams every 2 weeks until week 36. Then, you will have weekly prenatal exams. During a routine prenatal visit:   You will be weighed to make sure you and the fetus are growing normally.   Your blood pressure is taken.   Your abdomen will be  measured to track your baby's growth.   The fetal heartbeat will be listened to.   Any test results from the previous visit will be discussed.   You may have a cervical check near your due date to see if you have effaced.  At around 36 weeks, your caregiver will check your cervix. At the same time, your caregiver will also perform a test on the secretions of the vaginal tissue. This test is to determine if a type of bacteria, Group B streptococcus, is present. Your caregiver will explain this further.  Your caregiver may ask you:   What your birth plan is.   How you are feeling.   If you are feeling the baby move.   If you have had any abnormal symptoms, such as leaking fluid, bleeding, severe headaches, or abdominal cramping.   If you have any questions.  Other tests or screenings that may be performed during your third trimester include:   Blood tests that check for low iron levels (anemia).   Fetal testing to check the health, activity level, and growth of the fetus. Testing is done if you have certain medical conditions or if there are problems during the pregnancy.  FALSE LABOR  You may feel small, irregular contractions that eventually go away. These are called Braxton Hicks contractions, or   false labor. Contractions may last for hours, days, or even weeks before true labor sets in. If contractions come at regular intervals, intensify, or become painful, it is best to be seen by your caregiver.   SIGNS OF LABOR    Menstrual-like cramps.   Contractions that are 5 minutes apart or less.   Contractions that start on the top of the uterus and spread down to the lower abdomen and back.   A sense of increased pelvic pressure or back pain.   A watery or bloody mucus discharge that comes from the vagina.  If you have any of these signs before the 37th week of pregnancy, call your caregiver right away. You need to go to the hospital to get checked immediately.  HOME CARE INSTRUCTIONS    Avoid all  smoking, herbs, alcohol, and unprescribed drugs. These chemicals affect the formation and growth of the baby.   Follow your caregiver's instructions regarding medicine use. There are medicines that are either safe or unsafe to take during pregnancy.   Exercise only as directed by your caregiver. Experiencing uterine cramps is a good sign to stop exercising.   Continue to eat regular, healthy meals.   Wear a good support bra for breast tenderness.   Do not use hot tubs, steam rooms, or saunas.   Wear your seat belt at all times when driving.   Avoid raw meat, uncooked cheese, cat litter boxes, and soil used by cats. These carry germs that can cause birth defects in the baby.   Take your prenatal vitamins.   Try taking a stool softener (if your caregiver approves) if you develop constipation. Eat more high-fiber foods, such as fresh vegetables or fruit and whole grains. Drink plenty of fluids to keep your urine clear or pale yellow.   Take warm sitz baths to soothe any pain or discomfort caused by hemorrhoids. Use hemorrhoid cream if your caregiver approves.   If you develop varicose veins, wear support hose. Elevate your feet for 15 minutes, 3 4 times a day. Limit salt in your diet.   Avoid heavy lifting, wear low heal shoes, and practice good posture.   Rest a lot with your legs elevated if you have leg cramps or low back pain.   Visit your dentist if you have not gone during your pregnancy. Use a soft toothbrush to brush your teeth and be gentle when you floss.   A sexual relationship may be continued unless your caregiver directs you otherwise.   Do not travel far distances unless it is absolutely necessary and only with the approval of your caregiver.   Take prenatal classes to understand, practice, and ask questions about the labor and delivery.   Make a trial run to the hospital.   Pack your hospital bag.   Prepare the baby's nursery.   Continue to go to all your prenatal visits as directed  by your caregiver.  SEEK MEDICAL CARE IF:   You are unsure if you are in labor or if your water has broken.   You have dizziness.   You have mild pelvic cramps, pelvic pressure, or nagging pain in your abdominal area.   You have persistent nausea, vomiting, or diarrhea.   You have a bad smelling vaginal discharge.   You have pain with urination.  SEEK IMMEDIATE MEDICAL CARE IF:    You have a fever.   You are leaking fluid from your vagina.   You have spotting or bleeding from your vagina.     You have severe abdominal cramping or pain.   You have rapid weight loss or gain.   You have shortness of breath with chest pain.   You notice sudden or extreme swelling of your face, hands, ankles, feet, or legs.   You have not felt your baby move in over an hour.   You have severe headaches that do not go away with medicine.   You have vision changes.  Document Released: 11/21/2001 Document Revised: 07/30/2013 Document Reviewed: 01/28/2013  [employer] Patient Information 2014 [employer].

## 2013-12-17 NOTE — Progress Notes (Signed)
F/u US @ [facility] on 12/31/13 @ 1000. Fetal echo scheduled w/Dr. Elizebeth Brookingotton on 01/14/14 @ 1100

## 2013-12-19 ENCOUNTER — Other Ambulatory Visit: Payer: BC Managed Care – PPO

## 2013-12-31 ENCOUNTER — Encounter (HOSPITAL_COMMUNITY): Payer: Self-pay

## 2013-12-31 ENCOUNTER — Ambulatory Visit (HOSPITAL_COMMUNITY)
Admission: RE | Admit: 2013-12-31 | Discharge: 2013-12-31 | Disposition: A | Discharge status: Home / Self Care | Payer: BC Managed Care – PPO | Source: Ambulatory Visit | Attending: Family Medicine | Admitting: Family Medicine

## 2013-12-31 ENCOUNTER — Other Ambulatory Visit: Payer: Self-pay

## 2013-12-31 ENCOUNTER — Ambulatory Visit (INDEPENDENT_AMBULATORY_CARE_PROVIDER_SITE_OTHER): Payer: BC Managed Care – PPO | Admitting: Advanced Practice Midwife

## 2013-12-31 VITALS — BP 134/81 | HR 83 | Wt 235.5 lb

## 2013-12-31 VITALS — BP 139/89 | Temp 98.1°F | Wt 235.2 lb

## 2013-12-31 DIAGNOSIS — O3500X0 Maternal care for (suspected) central nervous system malformation or damage in fetus, unspecified, not applicable or unspecified: Secondary | ICD-10-CM

## 2013-12-31 DIAGNOSIS — O99210 Obesity complicating pregnancy, unspecified trimester: Secondary | ICD-10-CM

## 2013-12-31 DIAGNOSIS — Z3400 Encounter for supervision of normal first pregnancy, unspecified trimester: Secondary | ICD-10-CM

## 2013-12-31 DIAGNOSIS — O99350 Diseases of the nervous system complicating pregnancy, unspecified trimester: Secondary | ICD-10-CM

## 2013-12-31 DIAGNOSIS — O350XX0 Maternal care for (suspected) central nervous system malformation in fetus, not applicable or unspecified: Secondary | ICD-10-CM

## 2013-12-31 DIAGNOSIS — E669 Obesity, unspecified: Secondary | ICD-10-CM | POA: Insufficient documentation

## 2013-12-31 DIAGNOSIS — G40909 Epilepsy, unspecified, not intractable, without status epilepticus: Secondary | ICD-10-CM | POA: Insufficient documentation

## 2013-12-31 DIAGNOSIS — Z3403 Encounter for supervision of normal first pregnancy, third trimester: Secondary | ICD-10-CM

## 2013-12-31 DIAGNOSIS — IMO0002 Reserved for concepts with insufficient information to code with codable children: Secondary | ICD-10-CM

## 2013-12-31 LAB — POCT URINALYSIS DIP (DEVICE)
Bilirubin Urine: NEGATIVE
Glucose, UA: NEGATIVE mg/dL
HGB URINE DIPSTICK: NEGATIVE
Ketones, ur: NEGATIVE mg/dL
Leukocytes, UA: NEGATIVE
NITRITE: NEGATIVE
PH: 6 (ref 5.0–8.0)
Protein, ur: NEGATIVE mg/dL
Specific Gravity, Urine: 1.025 (ref 1.005–1.030)
Urobilinogen, UA: 0.2 mg/dL (ref 0.0–1.0)

## 2013-12-31 NOTE — Progress Notes (Signed)
Pulse: 91

## 2013-12-31 NOTE — Progress Notes (Signed)
Doing well.  Good fetal movement, denies vaginal bleeding, LOF, regular contractions.  

## 2013-12-31 NOTE — Progress Notes (Signed)
Barbara Robertson  was seen today for an ultrasound appointment.  See full report in [facility].  Impression: Single IUP at  32 6/7 weeks Hx of maternal seizure disorder Interval growth is apporpriate (50th %tile) Mild, isolated left ventriculomegaly is again noted (11-12 mm) The remainder of the fetal anatomy is otherwise within normal limits Normal amniotic fluid volume  After counseling, the patient elected to undergo NIPS and viral serologies (CMV, Parvo, Toxo)  Recommendations: Notify Peds at the time of delivery Recommend imaging studies of the newborn after delivery  Alpha GulaPaul Keidan Aumiller, MD

## 2014-01-01 LAB — TOXOPLASMA ANTIBODIES- IGG AND  IGM
Toxoplasma Antibody- IgM: <3 IU/mL (ref <8.0)
Toxoplasma IgG Ratio: <3 IU/mL (ref <7.2)

## 2014-01-02 LAB — CMV IGM: CMV IgM: <8 AU/mL (ref <30.00)

## 2014-01-02 LAB — CMV ANTIBODY, IGG (EIA): CMV Ab - IgG: <0.2 U/mL (ref <0.60)

## 2014-01-03 LAB — PARVOVIRUS B19 ANTIBODY, IGG AND IGM
PAROVIRUS B19 IGM ABS: 0.1 {index} (ref <0.9)
Parovirus B19 IgG Abs: 0.1 index (ref <0.9)

## 2014-01-05 ENCOUNTER — Telehealth (HOSPITAL_COMMUNITY): Payer: Self-pay | Admitting: *Deleted

## 2014-01-05 ENCOUNTER — Telehealth: Payer: Self-pay | Admitting: General Practice

## 2014-01-05 ENCOUNTER — Encounter: Payer: Self-pay | Admitting: Family Medicine

## 2014-01-05 NOTE — Telephone Encounter (Signed)
Called patient to inform her of normal CMV, Toxo, and parvo lab results.  Verified by name and dob.  Pt verbalized understanding.

## 2014-01-05 NOTE — Telephone Encounter (Signed)
Patient called to front office and had questions about what medications are safe to take during pregnancy for a cough/cold. Reviewed approved list of medications with patient and instructed her to avoid the use of phenylephrine. Patient verbalized understanding and had no further questions

## 2014-01-09 ENCOUNTER — Telehealth (HOSPITAL_COMMUNITY): Payer: Self-pay

## 2014-01-09 NOTE — Telephone Encounter (Signed)
Called Barbara Robertson regarding her NIPS results.  Left a message for her to return my call.

## 2014-01-09 NOTE — Telephone Encounter (Signed)
Called Barbara Robertson to discuss her cell free fetal DNA test results.  Mrs. Barbara Robertson had [facility] testing through [facility].  Testing was offered because of ventriculomegaly.   The patient was identified by name and DOB.  We reviewed that these are within normal limits, showing a less than 1 in 10,000 risk for trisomies 21, 18 and 13, and monosomy X (Turner syndrome).  In addition, the risk for triploidy/vanishing twin and sex chromosome trisomies (47,XXX and 47,XXY) was also low risk. We reviewed that this testing identifies > 99% of pregnancies with trisomy 5521, trisomy 7113, sex chromosome trisomies (47,XXX and 47,XXY), and triploidy. The detection rate for trisomy 18 is 96%.  The detection rate for monosomy X is ~92%.  The false positive rate is <0.1% for all conditions.  She understands that this testing does not identify all genetic conditions.  All questions were answered to her satisfaction, she was encouraged to call with additional questions or concerns.  Mady Gemmaaragh Conrad, MS Certified Genetic Counselor

## 2014-01-13 ENCOUNTER — Encounter: Payer: BC Managed Care – PPO | Admitting: Obstetrics and Gynecology

## 2014-01-13 ENCOUNTER — Ambulatory Visit (INDEPENDENT_AMBULATORY_CARE_PROVIDER_SITE_OTHER): Payer: BC Managed Care – PPO | Admitting: Family Medicine

## 2014-01-13 VITALS — BP 136/74 | Temp 97.5°F | Wt 237.5 lb

## 2014-01-13 DIAGNOSIS — R030 Elevated blood-pressure reading, without diagnosis of hypertension: Secondary | ICD-10-CM

## 2014-01-13 DIAGNOSIS — O360990 Maternal care for other rhesus isoimmunization, unspecified trimester, not applicable or unspecified: Secondary | ICD-10-CM

## 2014-01-13 DIAGNOSIS — Z3400 Encounter for supervision of normal first pregnancy, unspecified trimester: Secondary | ICD-10-CM

## 2014-01-13 DIAGNOSIS — IMO0001 Reserved for inherently not codable concepts without codable children: Secondary | ICD-10-CM

## 2014-01-13 LAB — POCT URINALYSIS DIP (DEVICE)
Bilirubin Urine: NEGATIVE
GLUCOSE, UA: NEGATIVE mg/dL
Hgb urine dipstick: NEGATIVE
KETONES UR: NEGATIVE mg/dL
Nitrite: NEGATIVE
Protein, ur: NEGATIVE mg/dL
Specific Gravity, Urine: >=1.03 (ref 1.005–1.030)
Urobilinogen, UA: 0.2 mg/dL (ref 0.0–1.0)
pH: 5.5 (ref 5.0–8.0)

## 2014-01-13 NOTE — Progress Notes (Signed)
S: 23 yo G1 @ 3135w5d here for ROBV - no ctx, lof, vb. +FM  O: see flowsheet  A/P - doing well - f/u in 1 week for 36 week cultures and OBV  - initial BP elevated but repeat WNL

## 2014-01-13 NOTE — Progress Notes (Signed)
Of note, pt measuring small but has been. US 2 weeks ago with EFW of 50th %ile. Cont to monitor for now.

## 2014-01-13 NOTE — Progress Notes (Signed)
Pulse: 85

## 2014-01-13 NOTE — Patient Instructions (Signed)
Third Trimester of Pregnancy  The third trimester is from week 29 through week 42, months 7 through 9. The third trimester is a time when the fetus is growing rapidly. At the end of the ninth month, the fetus is about 20 inches in length and weighs 6 10 pounds.   BODY CHANGES  Your body goes through many changes during pregnancy. The changes vary from woman to woman.    Your weight will continue to increase. You can expect to gain 25 35 pounds (11 16 kg) by the end of the pregnancy.   You may begin to get stretch marks on your hips, abdomen, and breasts.   You may urinate more often because the fetus is moving lower into your pelvis and pressing on your bladder.   You may develop or continue to have heartburn as a result of your pregnancy.   You may develop constipation because certain hormones are causing the muscles that push waste through your intestines to slow down.   You may develop hemorrhoids or swollen, bulging veins (varicose veins).   You may have pelvic pain because of the weight gain and pregnancy hormones relaxing your joints between the bones in your pelvis. Back aches may result from over exertion of the muscles supporting your posture.   Your breasts will continue to grow and be tender. A yellow discharge may leak from your breasts called colostrum.   Your belly button may stick out.   You may feel short of breath because of your expanding uterus.   You may notice the fetus "dropping," or moving lower in your abdomen.   You may have a bloody mucus discharge. This usually occurs a few days to a week before labor begins.   Your cervix becomes thin and soft (effaced) near your due date.  WHAT TO EXPECT AT YOUR PRENATAL EXAMS   You will have prenatal exams every 2 weeks until week 36. Then, you will have weekly prenatal exams. During a routine prenatal visit:   You will be weighed to make sure you and the fetus are growing normally.   Your blood pressure is taken.   Your abdomen will be  measured to track your baby's growth.   The fetal heartbeat will be listened to.   Any test results from the previous visit will be discussed.   You may have a cervical check near your due date to see if you have effaced.  At around 36 weeks, your caregiver will check your cervix. At the same time, your caregiver will also perform a test on the secretions of the vaginal tissue. This test is to determine if a type of bacteria, Group B streptococcus, is present. Your caregiver will explain this further.  Your caregiver may ask you:   What your birth plan is.   How you are feeling.   If you are feeling the baby move.   If you have had any abnormal symptoms, such as leaking fluid, bleeding, severe headaches, or abdominal cramping.   If you have any questions.  Other tests or screenings that may be performed during your third trimester include:   Blood tests that check for low iron levels (anemia).   Fetal testing to check the health, activity level, and growth of the fetus. Testing is done if you have certain medical conditions or if there are problems during the pregnancy.  FALSE LABOR  You may feel small, irregular contractions that eventually go away. These are called Braxton Hicks contractions, or   false labor. Contractions may last for hours, days, or even weeks before true labor sets in. If contractions come at regular intervals, intensify, or become painful, it is best to be seen by your caregiver.   SIGNS OF LABOR    Menstrual-like cramps.   Contractions that are 5 minutes apart or less.   Contractions that start on the top of the uterus and spread down to the lower abdomen and back.   A sense of increased pelvic pressure or back pain.   A watery or bloody mucus discharge that comes from the vagina.  If you have any of these signs before the 37th week of pregnancy, call your caregiver right away. You need to go to the hospital to get checked immediately.  HOME CARE INSTRUCTIONS    Avoid all  smoking, herbs, alcohol, and unprescribed drugs. These chemicals affect the formation and growth of the baby.   Follow your caregiver's instructions regarding medicine use. There are medicines that are either safe or unsafe to take during pregnancy.   Exercise only as directed by your caregiver. Experiencing uterine cramps is a good sign to stop exercising.   Continue to eat regular, healthy meals.   Wear a good support bra for breast tenderness.   Do not use hot tubs, steam rooms, or saunas.   Wear your seat belt at all times when driving.   Avoid raw meat, uncooked cheese, cat litter boxes, and soil used by cats. These carry germs that can cause birth defects in the baby.   Take your prenatal vitamins.   Try taking a stool softener (if your caregiver approves) if you develop constipation. Eat more high-fiber foods, such as fresh vegetables or fruit and whole grains. Drink plenty of fluids to keep your urine clear or pale yellow.   Take warm sitz baths to soothe any pain or discomfort caused by hemorrhoids. Use hemorrhoid cream if your caregiver approves.   If you develop varicose veins, wear support hose. Elevate your feet for 15 minutes, 3 4 times a day. Limit salt in your diet.   Avoid heavy lifting, wear low heal shoes, and practice good posture.   Rest a lot with your legs elevated if you have leg cramps or low back pain.   Visit your dentist if you have not gone during your pregnancy. Use a soft toothbrush to brush your teeth and be gentle when you floss.   A sexual relationship may be continued unless your caregiver directs you otherwise.   Do not travel far distances unless it is absolutely necessary and only with the approval of your caregiver.   Take prenatal classes to understand, practice, and ask questions about the labor and delivery.   Make a trial run to the hospital.   Pack your hospital bag.   Prepare the baby's nursery.   Continue to go to all your prenatal visits as directed  by your caregiver.  SEEK MEDICAL CARE IF:   You are unsure if you are in labor or if your water has broken.   You have dizziness.   You have mild pelvic cramps, pelvic pressure, or nagging pain in your abdominal area.   You have persistent nausea, vomiting, or diarrhea.   You have a bad smelling vaginal discharge.   You have pain with urination.  SEEK IMMEDIATE MEDICAL CARE IF:    You have a fever.   You are leaking fluid from your vagina.   You have spotting or bleeding from your vagina.     You have severe abdominal cramping or pain.   You have rapid weight loss or gain.   You have shortness of breath with chest pain.   You notice sudden or extreme swelling of your face, hands, ankles, feet, or legs.   You have not felt your baby move in over an hour.   You have severe headaches that do not go away with medicine.   You have vision changes.  Document Released: 11/21/2001 Document Revised: 07/30/2013 Document Reviewed: 01/28/2013  [employer] Patient Information 2014 [employer].

## 2014-01-14 ENCOUNTER — Encounter: Payer: Self-pay | Admitting: *Deleted

## 2014-01-16 ENCOUNTER — Encounter: Payer: Self-pay | Admitting: Family Medicine

## 2014-01-28 ENCOUNTER — Inpatient Hospital Stay (HOSPITAL_COMMUNITY): Payer: BC Managed Care – PPO

## 2014-01-28 ENCOUNTER — Encounter (HOSPITAL_COMMUNITY): Payer: Self-pay | Admitting: *Deleted

## 2014-01-28 ENCOUNTER — Inpatient Hospital Stay (HOSPITAL_COMMUNITY)
Admission: AD | Admit: 2014-01-28 | Discharge: 2014-01-28 | Disposition: A | Discharge status: Home / Self Care | Payer: BC Managed Care – PPO | Source: Ambulatory Visit | Attending: Obstetrics & Gynecology | Admitting: Obstetrics & Gynecology

## 2014-01-28 ENCOUNTER — Ambulatory Visit (INDEPENDENT_AMBULATORY_CARE_PROVIDER_SITE_OTHER): Payer: BC Managed Care – PPO | Admitting: Family

## 2014-01-28 VITALS — BP 135/96 | Temp 98.3°F | Wt 241.4 lb

## 2014-01-28 DIAGNOSIS — F909 Attention-deficit hyperactivity disorder, unspecified type: Secondary | ICD-10-CM | POA: Insufficient documentation

## 2014-01-28 DIAGNOSIS — O9989 Other specified diseases and conditions complicating pregnancy, childbirth and the puerperium: Principal | ICD-10-CM

## 2014-01-28 DIAGNOSIS — O139 Gestational [pregnancy-induced] hypertension without significant proteinuria, unspecified trimester: Secondary | ICD-10-CM

## 2014-01-28 DIAGNOSIS — O133 Gestational [pregnancy-induced] hypertension without significant proteinuria, third trimester: Secondary | ICD-10-CM

## 2014-01-28 DIAGNOSIS — O99891 Other specified diseases and conditions complicating pregnancy: Secondary | ICD-10-CM | POA: Insufficient documentation

## 2014-01-28 DIAGNOSIS — R030 Elevated blood-pressure reading, without diagnosis of hypertension: Secondary | ICD-10-CM | POA: Insufficient documentation

## 2014-01-28 DIAGNOSIS — O0990 Supervision of high risk pregnancy, unspecified, unspecified trimester: Secondary | ICD-10-CM

## 2014-01-28 LAB — COMPREHENSIVE METABOLIC PANEL
ALBUMIN: 3 g/dL — AB (ref 3.5–5.2)
ALT: 9 U/L (ref 0–35)
AST: 12 U/L (ref 0–37)
Alkaline Phosphatase: 137 U/L — ABNORMAL HIGH (ref 39–117)
BILIRUBIN TOTAL: 0.2 mg/dL — AB (ref 0.3–1.2)
BUN: 8 mg/dL (ref 6–23)
CHLORIDE: 101 meq/L (ref 96–112)
CO2: 23 mEq/L (ref 19–32)
CREATININE: 0.58 mg/dL (ref 0.50–1.10)
Calcium: 9.3 mg/dL (ref 8.4–10.5)
GFR calc Af Amer: >90 mL/min (ref >90)
GFR calc non Af Amer: >90 mL/min (ref >90)
Glucose, Bld: 79 mg/dL (ref 70–99)
Potassium: 4 mEq/L (ref 3.7–5.3)
Sodium: 138 mEq/L (ref 137–147)
TOTAL PROTEIN: 6.6 g/dL (ref 6.0–8.3)

## 2014-01-28 LAB — URINALYSIS, ROUTINE W REFLEX MICROSCOPIC
Bilirubin Urine: NEGATIVE
GLUCOSE, UA: NEGATIVE mg/dL
Hgb urine dipstick: NEGATIVE
Ketones, ur: NEGATIVE mg/dL
LEUKOCYTES UA: NEGATIVE
Nitrite: NEGATIVE
PH: 6 (ref 5.0–8.0)
PROTEIN: NEGATIVE mg/dL
Urobilinogen, UA: 0.2 mg/dL (ref 0.0–1.0)

## 2014-01-28 LAB — CBC
HEMATOCRIT: 38.9 % (ref 36.0–46.0)
HEMOGLOBIN: 13.9 g/dL (ref 12.0–15.0)
MCH: 31.1 pg (ref 26.0–34.0)
MCHC: 35.7 g/dL (ref 30.0–36.0)
MCV: 87 fL (ref 78.0–100.0)
Platelets: 275 10*3/uL (ref 150–400)
RBC: 4.47 MIL/uL (ref 3.87–5.11)
RDW: 12.6 % (ref 11.5–15.5)
WBC: 11.9 10*3/uL — AB (ref 4.0–10.5)

## 2014-01-28 LAB — PROTEIN / CREATININE RATIO, URINE
CREATININE, URINE: 121.44 mg/dL
PROTEIN CREATININE RATIO: 0.06 (ref 0.00–0.15)
Total Protein, Urine: 7.2 mg/dL

## 2014-01-28 LAB — POCT URINALYSIS DIP (DEVICE)
Bilirubin Urine: NEGATIVE
GLUCOSE, UA: NEGATIVE mg/dL
HGB URINE DIPSTICK: NEGATIVE
Nitrite: NEGATIVE
Protein, ur: NEGATIVE mg/dL
Urobilinogen, UA: 0.2 mg/dL (ref 0.0–1.0)
pH: 6 (ref 5.0–8.0)

## 2014-01-28 LAB — OB RESULTS CONSOLE GC/CHLAMYDIA
Chlamydia: NEGATIVE
GC PROBE AMP, GENITAL: NEGATIVE

## 2014-01-28 NOTE — MAU Provider Note (Signed)
History     CSN: [id number]  Arrival date and time: 01/28/14 1208   First Provider Initiated Contact with Patient 01/28/14 1231      Chief Complaint  Patient presents with  . PIH evaluation    HPI Barbara Robertson is a 23 y.o. G1P0000 at [redacted]w[redacted]d who presents to the MAU for PIH evaluation. She was seen earlier today in the [facility] and had an initial BP reading of 163/96 and a repeat reading of 135/96. Her most recent BP reading in the MAU was 143/97. She denies HA, dizziness, vision changes, SOB, RUQ pain, epigastric pain, vaginal bleeding, LOF, or CTX. She also needs an US OB F/U for growth and presentation.     OB History   Grav Para Term Preterm Abortions TAB SAB Ect Mult Living   1 0 0 0 0 0 0 0 0 0       Past Medical History  Diagnosis Date  . ADHD (attention deficit hyperactivity disorder)     not on meds  . Seizures     as a child, last sz 12 yrs ago  . Pregnancy     Past Surgical History  Procedure Laterality Date  . No past surgeries      Family History  Problem Relation Age of Onset  . Arthritis Father   . Cancer Maternal Grandmother   . Diabetes Maternal Grandmother   . Arthritis Mother   . Hypertension Mother     History  Substance Use Topics  . Smoking status: Never Smoker   . Smokeless tobacco: Never Used  . Alcohol Use: No    Allergies:  Allergies  Allergen Reactions  . Other Other (See Comments)    Grapes- seizures    Prescriptions prior to admission  Medication Sig Dispense Refill  . Prenatal Vit-Fe Fumarate-FA (PRENATAL MULTIVITAMIN) TABS Take 1 tablet by mouth daily at 12 noon.        Review of Systems  Constitutional: Negative for fever and chills.  HENT: Negative for tinnitus.   Eyes: Negative for blurred vision.  Respiratory: Negative for shortness of breath.   Cardiovascular: Negative for chest pain and leg swelling.  Gastrointestinal: Negative for nausea, vomiting, abdominal pain, diarrhea, constipation and blood in stool.   Genitourinary: Negative for dysuria and hematuria.  Musculoskeletal: Positive for back pain.  Neurological: Negative for dizziness, weakness and headaches.   Physical Exam   Blood pressure 143/97, pulse 86, resp. rate 18, last menstrual period 05/04/2013.  Physical Exam Nursing note and vitals reviewed.  General: She is oriented to person, place, and time. She appears well-developed and well-nourished. No distress.  HENT:  Head: Normocephalic and atraumatic.  Eyes: EOM intact. Conjunctivae normal. No discharge.  Cardiovascular: Normal rate, regular rhythm, normal heart sounds and intact distal pulses.  No murmur heard.  Pulmonary/Chest: Effort normal and breath sounds normal. No respiratory distress.  Abdominal: Soft. There is no tenderness. There is no rebound and no guarding.  Pt is gravid  Musculoskeletal: She exhibits no tenderness. She exhibits no edema.  Neurological: She is alert and oriented to person, place, and time. She exhibits normal muscle tone. Coordination normal. 2+ DTRs throughout.  Skin: Skin is warm and dry.   Results for orders placed during the hospital encounter of 01/28/14 (from the past 24 hour(s))  PROTEIN / CREATININE RATIO, URINE     Status: None   Collection Time    01/28/14 12:10 PM      Result Value Ref Range   Creatinine,  Urine 121.44     Total Protein, Urine 7.2     PROTEIN CREATININE RATIO 0.06  0.00 - 0.15  URINALYSIS, ROUTINE W REFLEX MICROSCOPIC     Status: Abnormal   Collection Time    01/28/14 12:10 PM      Result Value Ref Range   Color, Urine YELLOW  YELLOW   APPearance CLEAR  CLEAR   Specific Gravity, Urine >1.030 (*) 1.005 - 1.030   pH 6.0  5.0 - 8.0   Glucose, UA NEGATIVE  NEGATIVE mg/dL   Hgb urine dipstick NEGATIVE  NEGATIVE   Bilirubin Urine NEGATIVE  NEGATIVE   Ketones, ur NEGATIVE  NEGATIVE mg/dL   Protein, ur NEGATIVE  NEGATIVE mg/dL   Urobilinogen, UA 0.2  0.0 - 1.0 mg/dL   Nitrite NEGATIVE  NEGATIVE   Leukocytes, UA  NEGATIVE  NEGATIVE  CBC     Status: Abnormal   Collection Time    01/28/14 12:22 PM      Result Value Ref Range   WBC 11.9 (*) 4.0 - 10.5 K/uL   RBC 4.47  3.87 - 5.11 MIL/uL   Hemoglobin 13.9  12.0 - 15.0 g/dL   HCT 16.1  09.6 - 04.5 %   MCV 87.0  78.0 - 100.0 fL   MCH 31.1  26.0 - 34.0 pg   MCHC 35.7  30.0 - 36.0 g/dL   RDW 40.9  81.1 - 91.4 %   Platelets 275  150 - 400 K/uL  COMPREHENSIVE METABOLIC PANEL     Status: Abnormal   Collection Time    01/28/14 12:22 PM      Result Value Ref Range   Sodium 138  137 - 147 mEq/L   Potassium 4.0  3.7 - 5.3 mEq/L   Chloride 101  96 - 112 mEq/L   CO2 23  19 - 32 mEq/L   Glucose, Bld 79  70 - 99 mg/dL   BUN 8  6 - 23 mg/dL   Creatinine, Ser 7.82  0.50 - 1.10 mg/dL   Calcium 9.3  8.4 - 95.6 mg/dL   Total Protein 6.6  6.0 - 8.3 g/dL   Albumin 3.0 (*) 3.5 - 5.2 g/dL   AST 12  0 - 37 U/L   ALT 9  0 - 35 U/L   Alkaline Phosphatase 137 (*) 39 - 117 U/L   Total Bilirubin 0.2 (*) 0.3 - 1.2 mg/dL   GFR calc non Af Amer >90  >90 mL/min   GFR calc Af Amer >90  >90 mL/min       MAU Course  Procedures  MDM 1. UA 2. Urine Pro/Cr ratio >>> 0.06 3. CBC 4. US OB F/U for growth and presentation   Assessment and Plan  Assessment: [name] is a 23 y.o. G1P0000 at [redacted]w[redacted]d who presents to the MAU for PIH evaluation. Her BP is still slightly elevated at 143/97 but she is stable and asymptomatic. Doubt preeclampsia b/c urine Pro/Cr ratio is 0.06. PIH is likely the cause for her elevated BP.    Plan:  1. Discharge home  2. 2x per week fetal testing 3. Change to [facility] 4. Return to MAU if symptoms worsen or if she experiences vaginal bleeding, LOF, or ctx     Milan, Bennie-John, T 01/28/2014, 1:38 PM   Filed Vitals:   01/28/14 1357 01/28/14 1402 01/28/14 1417 01/28/14 1432  BP: 126/79 123/74 122/68 115/64  Pulse: 92 101 87 89  Resp:  Results for orders placed during the hospital encounter of 01/28/14 (from the past 24 hour(s))   PROTEIN / CREATININE RATIO, URINE     Status: None   Collection Time    01/28/14 12:10 PM      Result Value Ref Range   Creatinine, Urine 121.44     Total Protein, Urine 7.2     PROTEIN CREATININE RATIO 0.06  0.00 - 0.15  URINALYSIS, ROUTINE W REFLEX MICROSCOPIC     Status: Abnormal   Collection Time    01/28/14 12:10 PM      Result Value Ref Range   Color, Urine YELLOW  YELLOW   APPearance CLEAR  CLEAR   Specific Gravity, Urine >1.030 (*) 1.005 - 1.030   pH 6.0  5.0 - 8.0   Glucose, UA NEGATIVE  NEGATIVE mg/dL   Hgb urine dipstick NEGATIVE  NEGATIVE   Bilirubin Urine NEGATIVE  NEGATIVE   Ketones, ur NEGATIVE  NEGATIVE mg/dL   Protein, ur NEGATIVE  NEGATIVE mg/dL   Urobilinogen, UA 0.2  0.0 - 1.0 mg/dL   Nitrite NEGATIVE  NEGATIVE   Leukocytes, UA NEGATIVE  NEGATIVE  CBC     Status: Abnormal   Collection Time    01/28/14 12:22 PM      Result Value Ref Range   WBC 11.9 (*) 4.0 - 10.5 K/uL   RBC 4.47  3.87 - 5.11 MIL/uL   Hemoglobin 13.9  12.0 - 15.0 g/dL   HCT 16.138.9  09.636.0 - 04.546.0 %   MCV 87.0  78.0 - 100.0 fL   MCH 31.1  26.0 - 34.0 pg   MCHC 35.7  30.0 - 36.0 g/dL   RDW 40.912.6  81.111.5 - 91.415.5 %   Platelets 275  150 - 400 K/uL  COMPREHENSIVE METABOLIC PANEL     Status: Abnormal   Collection Time    01/28/14 12:22 PM      Result Value Ref Range   Sodium 138  137 - 147 mEq/L   Potassium 4.0  3.7 - 5.3 mEq/L   Chloride 101  96 - 112 mEq/L   CO2 23  19 - 32 mEq/L   Glucose, Bld 79  70 - 99 mg/dL   BUN 8  6 - 23 mg/dL   Creatinine, Ser 7.820.58  0.50 - 1.10 mg/dL   Calcium 9.3  8.4 - 95.610.5 mg/dL   Total Protein 6.6  6.0 - 8.3 g/dL   Albumin 3.0 (*) 3.5 - 5.2 g/dL   AST 12  0 - 37 U/L   ALT 9  0 - 35 U/L   Alkaline Phosphatase 137 (*) 39 - 117 U/L   Total Bilirubin 0.2 (*) 0.3 - 1.2 mg/dL   GFR calc non Af Amer >90  >90 mL/min   GFR calc Af Amer >90  >90 mL/min     Seen and examined by me also Agree with note Labs normal US shows good growth, AFI, cephalic presentation  and resolution of cerebral ventriculomegaly. Transfer to HR clinic for 2x/wk testing Aviva SignsMarie L Yaniris Braddock, CNM

## 2014-01-28 NOTE — Progress Notes (Signed)
Initial BP 163/96, repeat 135/96.  Pt denies headache, epigastric pain or vision changes.  Denies feeling contractions > GBS and GC/CT collected, cervix found to be 2.5/50/-3, body protrusions felt with cervical check, uncertain presentation.  Pt sent to MAU for Prex labs and ultrasound for growth (due) and presentation check.  MAU staff notified and Dr. Gayla DossJoyner called and given pt report.  If discharged home pt needs 2x/week fetal testing and transfer to HR clinic.

## 2014-01-28 NOTE — MAU Note (Signed)
[facility]

## 2014-01-28 NOTE — Progress Notes (Signed)
Pulse: 88

## 2014-01-28 NOTE — Discharge Instructions (Signed)
Hypertension During Pregnancy Hypertension is also called high blood pressure. It can occur at any time in life and during pregnancy. When you have hypertension, there is extra pressure inside your blood vessels that carry blood from the heart to the rest of your body (arteries). Hypertension during pregnancy can cause problems for you and your baby. Your baby might not weigh as much as it should at birth or might be born early (premature). Very bad cases of hypertension during pregnancy can be life threatening.  Different types of hypertension can occur during pregnancy.   Chronic hypertension. This happens when a woman has hypertension before pregnancy and it continues during pregnancy.  Gestational hypertension. This is when hypertension develops during pregnancy.  Preeclampsia or toxemia of pregnancy. This is a very serious type of hypertension that develops only during pregnancy. It is a disease that affects the whole body (systemic) and can be very dangerous for both mother and baby.  Gestational hypertension and preeclampsia usually go away after your baby is born. Blood pressure generally stabilizes within 6 weeks. Women who have hypertension during pregnancy have a greater chance of developing hypertension later in life or with future pregnancies. RISK FACTORS Some factors make you more likely to develop hypertension during pregnancy. Risk factors include:  Having hypertension before pregnancy.  Having hypertension during a previous pregnancy.  Being overweight.  Being older than 40.  Being pregnant with more than one baby (multiples).  Having diabetes or kidney problems. SIGNS AND SYMPTOMS Chronic and gestational hypertension rarely cause symptoms. Preeclampsia has symptoms, which may include:  Increased protein in your urine. Your health care provider will check for this at every prenatal visit.  Swelling of your hands and face.  Rapid weight gain.  Headaches.  Visual  changes.  Being bothered by light.  Abdominal pain, especially in the right upper area.  Chest pain.  Shortness of breath.  Increased reflexes.  Seizures. Seizures occur with a more severe form of preeclampsia, called eclampsia. DIAGNOSIS   You may be diagnosed with hypertension during a regular prenatal exam. At each visit, tests may include:  Blood pressure checks.  A urine test to check for protein in your urine.  The type of hypertension you are diagnosed with depends on when you developed it. It also depends on your specific blood pressure reading.  Developing hypertension before 20 weeks of pregnancy is consistent with chronic hypertension.  Developing hypertension after 20 weeks of pregnancy is consistent with gestational hypertension.  Hypertension with increased urinary protein is diagnosed as preeclampsia.  Blood pressure measurements that stay above 160 systolic or 110 diastolic are a sign of severe preeclampsia. TREATMENT Treatment for hypertension during pregnancy varies. Treatment depends on the type of hypertension and how serious it is.  If you take medicine for chronic hypertension, you may need to switch medicines.  Drugs called ACE inhibitors should not be taken during pregnancy.  Low-dose aspirin may be suggested for women who have risk factors for preeclampsia.  If you have gestational hypertension, you may need to take a blood pressure medicine that is safe during pregnancy. Your health care provider will recommend the appropriate medicine.  If you have severe preeclampsia, you may need to be in the hospital. Health care providers will watch you and the baby very closely. You also may need to take medicine (magnesium sulfate) to prevent seizures and lower blood pressure.  Sometimes an early delivery is needed. This may be the case if the condition worsens. It would   be done to protect you and the baby. The only cure for preeclampsia is delivery. HOME  CARE INSTRUCTIONS  Schedule and keep all of your regular appointments for prenatal care.  Only take over-the-counter or prescription medicines as directed by your health care provider. Tell your health care provider about all medicines you take.  Eat as little salt as possible.  Get regular exercise.  Do not drink alcohol.  Do not use tobacco products.  Do not drink products with caffeine.  Lie on your left side when resting. SEEK IMMEDIATE MEDICAL CARE IF:  You have severe abdominal pain.  You have sudden swelling in the hands, ankles, or face.  You gain 4 pounds (1.8 kg) or more in 1 week.  You vomit repeatedly.  You have vaginal bleeding.  You do not feel the baby moving as much.  You have a headache.  You have blurred or double vision.  You have muscle twitching or spasms.  You have shortness of breath.  You have blue fingernails and lips.  You have blood in your urine. MAKE SURE YOU:  Understand these instructions.  Will watch your condition.  Will get help right away if you are not doing well or get worse. Document Released: 08/15/2011 Document Revised: 09/17/2013 Document Reviewed: 06/26/2013 Exit[employer] Patient Information 2014 [employer].  

## 2014-01-29 ENCOUNTER — Inpatient Hospital Stay (HOSPITAL_COMMUNITY)
Admission: AD | Admit: 2014-01-29 | Discharge: 2014-01-29 | Disposition: A | Discharge status: Home / Self Care | Payer: BC Managed Care – PPO | Source: Ambulatory Visit | Attending: Obstetrics & Gynecology | Admitting: Obstetrics & Gynecology

## 2014-01-29 ENCOUNTER — Inpatient Hospital Stay (HOSPITAL_COMMUNITY)
Admission: AD | Admit: 2014-01-29 | Discharge: 2014-02-01 | DRG: 775 | Disposition: A | Discharge status: Home / Self Care | Payer: BC Managed Care – PPO | Source: Ambulatory Visit | Attending: Family Medicine | Admitting: Family Medicine

## 2014-01-29 ENCOUNTER — Encounter (HOSPITAL_COMMUNITY): Payer: Self-pay

## 2014-01-29 ENCOUNTER — Encounter (HOSPITAL_COMMUNITY): Payer: Self-pay | Admitting: *Deleted

## 2014-01-29 DIAGNOSIS — O99210 Obesity complicating pregnancy, unspecified trimester: Secondary | ICD-10-CM

## 2014-01-29 DIAGNOSIS — F909 Attention-deficit hyperactivity disorder, unspecified type: Secondary | ICD-10-CM | POA: Diagnosis present

## 2014-01-29 DIAGNOSIS — O139 Gestational [pregnancy-induced] hypertension without significant proteinuria, unspecified trimester: Principal | ICD-10-CM | POA: Diagnosis present

## 2014-01-29 DIAGNOSIS — O99344 Other mental disorders complicating childbirth: Secondary | ICD-10-CM | POA: Diagnosis present

## 2014-01-29 DIAGNOSIS — O99354 Diseases of the nervous system complicating childbirth: Secondary | ICD-10-CM | POA: Diagnosis present

## 2014-01-29 DIAGNOSIS — O360990 Maternal care for other rhesus isoimmunization, unspecified trimester, not applicable or unspecified: Secondary | ICD-10-CM

## 2014-01-29 DIAGNOSIS — G40909 Epilepsy, unspecified, not intractable, without status epilepticus: Secondary | ICD-10-CM | POA: Diagnosis present

## 2014-01-29 DIAGNOSIS — Z8759 Personal history of other complications of pregnancy, childbirth and the puerperium: Secondary | ICD-10-CM | POA: Diagnosis present

## 2014-01-29 LAB — URINE MICROSCOPIC-ADD ON

## 2014-01-29 LAB — CBC
HEMATOCRIT: 36.5 % (ref 36.0–46.0)
Hemoglobin: 13.2 g/dL (ref 12.0–15.0)
MCH: 31.2 pg (ref 26.0–34.0)
MCHC: 36.2 g/dL — ABNORMAL HIGH (ref 30.0–36.0)
MCV: 86.3 fL (ref 78.0–100.0)
Platelets: 284 10*3/uL (ref 150–400)
RBC: 4.23 MIL/uL (ref 3.87–5.11)
RDW: 12.7 % (ref 11.5–15.5)
WBC: 11.1 10*3/uL — AB (ref 4.0–10.5)

## 2014-01-29 LAB — COMPREHENSIVE METABOLIC PANEL
ALK PHOS: 124 U/L — AB (ref 39–117)
ALT: 9 U/L (ref 0–35)
AST: 12 U/L (ref 0–37)
Albumin: 2.9 g/dL — ABNORMAL LOW (ref 3.5–5.2)
BUN: 8 mg/dL (ref 6–23)
CO2: 21 mEq/L (ref 19–32)
Calcium: 8.8 mg/dL (ref 8.4–10.5)
Chloride: 107 mEq/L (ref 96–112)
Creatinine, Ser: 0.51 mg/dL (ref 0.50–1.10)
GFR calc non Af Amer: >90 mL/min (ref >90)
Glucose, Bld: 91 mg/dL (ref 70–99)
POTASSIUM: 3.6 meq/L — AB (ref 3.7–5.3)
SODIUM: 136 meq/L — AB (ref 137–147)
TOTAL PROTEIN: 6.5 g/dL (ref 6.0–8.3)
Total Bilirubin: <0.2 mg/dL — ABNORMAL LOW (ref 0.3–1.2)

## 2014-01-29 LAB — TYPE AND SCREEN
ABO/RH(D): A NEG
ANTIBODY SCREEN: NEGATIVE

## 2014-01-29 LAB — URINALYSIS, ROUTINE W REFLEX MICROSCOPIC
BILIRUBIN URINE: NEGATIVE
GLUCOSE, UA: NEGATIVE mg/dL
Ketones, ur: NEGATIVE mg/dL
LEUKOCYTES UA: NEGATIVE
NITRITE: NEGATIVE
PH: 5.5 (ref 5.0–8.0)
PROTEIN: NEGATIVE mg/dL
Specific Gravity, Urine: >1.03 — ABNORMAL HIGH (ref 1.005–1.030)
Urobilinogen, UA: 0.2 mg/dL (ref 0.0–1.0)

## 2014-01-29 LAB — PROTEIN / CREATININE RATIO, URINE
Creatinine, Urine: 171.42 mg/dL
PROTEIN CREATININE RATIO: 0.05 (ref 0.00–0.15)
TOTAL PROTEIN, URINE: 9 mg/dL

## 2014-01-29 LAB — GROUP B STREP BY PCR: GROUP B STREP BY PCR: NEGATIVE

## 2014-01-29 LAB — GC/CHLAMYDIA PROBE AMP
CT PROBE, AMP APTIMA: NEGATIVE
GC PROBE AMP APTIMA: NEGATIVE

## 2014-01-29 LAB — OB RESULTS CONSOLE GBS: STREP GROUP B AG: NEGATIVE

## 2014-01-29 MED ORDER — OXYTOCIN 40 UNITS IN LACTATED RINGERS INFUSION - SIMPLE MED
1.0000 m[IU]/min | INTRAVENOUS | Status: DC
  Administered 2014-01-29: 1 m[IU]/min via INTRAVENOUS

## 2014-01-29 MED ORDER — DIPHENHYDRAMINE HCL 50 MG/ML IJ SOLN: 12.5000 mg | INTRAMUSCULAR | Status: DC | PRN

## 2014-01-29 MED ORDER — ONDANSETRON HCL 4 MG/2ML IJ SOLN
4.0000 mg | Freq: Four times a day (QID) | INTRAMUSCULAR | Status: DC | PRN
  Administered 2014-01-30: 4 mg via INTRAVENOUS
  Filled 2014-01-29: qty 2

## 2014-01-29 MED ORDER — IBUPROFEN 600 MG PO TABS
600.0000 mg | ORAL_TABLET | Freq: Four times a day (QID) | ORAL | Status: DC | PRN
Start: 2014-01-29 — End: 2014-01-30

## 2014-01-29 MED ORDER — EPHEDRINE 5 MG/ML INJ
10.0000 mg | INTRAVENOUS | Status: DC | PRN
  Filled 2014-01-29: qty 4

## 2014-01-29 MED ORDER — CITRIC ACID-SODIUM CITRATE 334-500 MG/5ML PO SOLN: 30.0000 mL | ORAL | Status: DC | PRN

## 2014-01-29 MED ORDER — ZOLPIDEM TARTRATE 5 MG PO TABS: 5.0000 mg | ORAL_TABLET | Freq: Every evening | ORAL | Status: DC | PRN

## 2014-01-29 MED ORDER — FENTANYL CITRATE 0.05 MG/ML IJ SOLN: 50.0000 ug | INTRAMUSCULAR | Status: DC | PRN

## 2014-01-29 MED ORDER — OXYCODONE-ACETAMINOPHEN 5-325 MG PO TABS: 1.0000 | ORAL_TABLET | ORAL | Status: DC | PRN

## 2014-01-29 MED ORDER — OXYTOCIN 40 UNITS IN LACTATED RINGERS INFUSION - SIMPLE MED
62.5000 mL/h | INTRAVENOUS | Status: DC
  Administered 2014-01-30: 62.5 mL/h via INTRAVENOUS
  Filled 2014-01-29: qty 1000

## 2014-01-29 MED ORDER — LACTATED RINGERS IV SOLN
500.0000 mL | Freq: Once | INTRAVENOUS | Status: AC
  Administered 2014-01-30: 500 mL via INTRAVENOUS

## 2014-01-29 MED ORDER — LACTATED RINGERS IV SOLN
INTRAVENOUS | Status: DC
  Administered 2014-01-29 – 2014-01-30 (×2): via INTRAVENOUS

## 2014-01-29 MED ORDER — OXYTOCIN BOLUS FROM INFUSION: 500.0000 mL | INTRAVENOUS | Status: DC

## 2014-01-29 MED ORDER — LACTATED RINGERS IV SOLN: 500.0000 mL | INTRAVENOUS | Status: DC | PRN

## 2014-01-29 MED ORDER — LIDOCAINE HCL (PF) 1 % IJ SOLN: 30.0000 mL | INTRAMUSCULAR | Status: DC | PRN

## 2014-01-29 MED ORDER — PHENYLEPHRINE 40 MCG/ML (10ML) SYRINGE FOR IV PUSH (FOR BLOOD PRESSURE SUPPORT)
80.0000 ug | PREFILLED_SYRINGE | INTRAVENOUS | Status: DC | PRN
  Filled 2014-01-29: qty 10

## 2014-01-29 MED ORDER — FENTANYL 2.5 MCG/ML BUPIVACAINE 1/10 % EPIDURAL INFUSION (WH - ANES)
14.0000 mL/h | INTRAMUSCULAR | Status: DC | PRN
  Filled 2014-01-29: qty 125

## 2014-01-29 MED ORDER — PHENYLEPHRINE 40 MCG/ML (10ML) SYRINGE FOR IV PUSH (FOR BLOOD PRESSURE SUPPORT): 80.0000 ug | PREFILLED_SYRINGE | INTRAVENOUS | Status: DC | PRN

## 2014-01-29 MED ORDER — TERBUTALINE SULFATE 1 MG/ML IJ SOLN: 0.2500 mg | Freq: Once | INTRAMUSCULAR | Status: AC | PRN

## 2014-01-29 MED ORDER — EPHEDRINE 5 MG/ML INJ: 10.0000 mg | INTRAVENOUS | Status: DC | PRN

## 2014-01-29 MED ORDER — ACETAMINOPHEN 325 MG PO TABS: 650.0000 mg | ORAL_TABLET | ORAL | Status: DC | PRN

## 2014-01-29 NOTE — MAU Note (Signed)
Pt states that uc began around 8pm and are now every 2-5 mins. Denies LOF or vag bleeding. +FM. Was sent from office yesterday to MAU for PIH eval.

## 2014-01-29 NOTE — H&P (Signed)

## 2014-01-29 NOTE — H&P (Signed)
Attestation of Attending Supervision of Advanced Practitioner (PA/CNM/NP): Evaluation and management procedures were performed by the Advanced Practitioner under my supervision and collaboration.  I have reviewed the Advanced Practitioner's note and chart, and I agree with the management and plan.  Will proceed with IOL for GHTN (preeclampsia evaluation ongoing); routine L&D care.  Jaynie CollinsUGONNA  Zuria Fosdick, MD, FACOG Attending Obstetrician & Gynecologist Faculty Practice, [facility]

## 2014-01-29 NOTE — H&P (Addendum)
Barbara Robertson is a 23 y.o. female G1 aat 8531w0d presenting for labor check. Maternal Medical History:  Reason for admission: Nausea.    [facility] with normal preE labs yesterday.  OB History   Grav Para Term Preterm Abortions TAB SAB Ect Mult Living   1 0 0 0 0 0 0 0 0 0      Past Medical History  Diagnosis Date  . ADHD (attention deficit hyperactivity disorder)     not on meds  . Seizures     as a child, last sz 12 yrs ago  . Pregnancy    Past Surgical History  Procedure Laterality Date  . No past surgeries     Family History: family history includes Arthritis in her father and mother; Cancer in her maternal grandmother; Diabetes in her maternal grandmother; Hypertension in her mother. Social History:  reports that she has never smoked. She has never used smokeless tobacco. She reports that she does not drink alcohol or use illicit drugs.   Prenatal Transfer Tool  Maternal Diabetes: No Genetic Screening: Normal Maternal Ultrasounds/Referrals: Normal Fetal Ultrasounds or other Referrals:  None Maternal Substance Abuse:  No Significant Maternal Medications:  None Significant Maternal Lab Results:  None Other Comments:  None GBS done 01/28/14 result pending so will obtain rapid test by PCR  Review of Systems  Constitutional: Negative for fever.  Eyes: Negative for blurred vision, double vision and photophobia.  Respiratory: Negative for cough.   Cardiovascular: Negative for chest pain.  Gastrointestinal: Positive for nausea and abdominal pain. Negative for vomiting.  Genitourinary: Negative for dysuria.  Neurological: Negative for dizziness and headaches.  Psychiatric/Behavioral: Negative for depression. The patient has insomnia.     Dilation: 4 Effacement (%): 80 Station: -2 Exam by:: K.WIlson,RN Blood pressure 138/90, pulse 95, temperature 98.6 F (37 C), temperature source Other (Comment), resp. rate 18, height  5\' 10"  (1.778 m), weight 111.222 kg (245 lb 3.2 oz), last menstrual period 05/04/2013. Maternal Exam:  Uterine Assessment: Contraction strength is moderate.  Contraction duration is 40 seconds. Contraction frequency is irregular.   Abdomen: Estimated fetal weight is 7#.   Fetal presentation: vertex  Introitus: Normal vulva. Normal vagina.  Ferning test: not done.  Nitrazine test: not done.  Pelvis: adequate for delivery.   Cervix: Cervix evaluated by digital exam.     Fetal Exam Fetal Monitor Review: Mode: ultrasound.   Baseline rate: 140.  Variability: moderate (6-25 bpm).   Pattern: accelerations present and no decelerations.    Fetal State Assessment: Category I - tracings are normal.    Dilation: 4 Effacement (%): 80 Cervical Position: Middle Station: -2 Presentation: Vertex Exam by:: K.WIlson,RN (my exam at 1820) slight show, intact  Physical Exam  Constitutional: She is oriented to person, place, and time. She appears well-developed and well-nourished. No distress.  HENT:  Head: Normocephalic.  Eyes: Pupils are equal, round, and reactive to light.  Neck: Normal range of motion. Neck supple.  Cardiovascular: Normal rate, regular rhythm and normal heart sounds.   Respiratory: Effort normal and breath sounds normal.  GI: There is no tenderness.  Genitourinary: Vagina normal.  Musculoskeletal: Normal range of motion.  Neurological: She is alert and oriented to person, place, and time. She has normal reflexes.  Skin: Skin is warm and dry.  Psychiatric: She has a normal mood and affect. Her behavior is normal.    Prenatal labs: ABO, Rh: A/NEG/-- (07/28 [id number]) Antibody: NEG (12/23  1552) Rubella: 7.62 (07/28 0922) RPR: NON REAC (12/23 1552)  HBsAg: NEGATIVE (07/28 1610)  HIV: NON REACTIVE (12/23 1552)  GBS:   pending, done 01/28/14> will call lab for result 3Hr OGTT: [id number] [place] 01/28/14: 48th%ile growth, normal AFV  Assessment/Plan: G1 early labor at term;  GHTN>preE labs pending  Rapid GBS by PCR  POE,DEIRDRE 01/29/2014, 6:33 PM

## 2014-01-29 NOTE — MAU Provider Note (Signed)
Attestation of Attending Supervision of Advanced Practitioner (PA/CNM/NP): Evaluation and management procedures were performed by the Advanced Practitioner under my supervision and collaboration.  I have reviewed the Advanced Practitioner's note and chart, and I agree with the management and plan.  Chene Kasinger, MD, FACOG Attending Obstetrician & Gynecologist Faculty Practice, [facility]  

## 2014-01-29 NOTE — Discharge Instructions (Signed)
Braxton Hicks Contractions Pregnancy is commonly associated with contractions of the uterus throughout the pregnancy. Towards the end of pregnancy (32 to 34 weeks), these contractions (Braxton Hicks) can develop more often and may become more forceful. This is not true labor because these contractions do not result in opening (dilatation) and thinning of the cervix. They are sometimes difficult to tell apart from true labor because these contractions can be forceful and people have different pain tolerances. You should not feel embarrassed if you go to the hospital with false labor. Sometimes, the only way to tell if you are in true labor is for your caregiver to follow the changes in the cervix. How to tell the difference between true and false labor:  False labor.  The contractions of false labor are usually shorter, irregular and not as hard as those of true labor.  They are often felt in the front of the lower abdomen and in the groin.  They may leave with walking around or changing positions while lying down.  They get weaker and are shorter lasting as time goes on.  These contractions are usually irregular.  They do not usually become progressively stronger, regular and closer together as with true labor.  True labor.  Contractions in true labor last 30 to 70 seconds, become very regular, usually become more intense, and increase in frequency.  They do not go away with walking.  The discomfort is usually felt in the top of the uterus and spreads to the lower abdomen and low back.  True labor can be determined by your caregiver with an exam. This will show that the cervix is dilating and getting thinner. If there are no prenatal problems or other health problems associated with the pregnancy, it is completely safe to be sent home with false labor and await the onset of true labor. HOME CARE INSTRUCTIONS   Keep up with your usual exercises and instructions.  Take medications as  directed.  Keep your regular prenatal appointment.  Eat and drink lightly if you think you are going into labor.  If BH contractions are making you uncomfortable:  Change your activity position from lying down or resting to walking/walking to resting.  Sit and rest in a tub of warm water.  Drink 2 to 3 glasses of water. Dehydration may cause B-H contractions.  Do slow and deep breathing several times an hour. SEEK IMMEDIATE MEDICAL CARE IF:   Your contractions continue to become stronger, more regular, and closer together.  You have a gushing, burst or leaking of fluid from the vagina.  An oral temperature above 102 F (38.9 C) develops.  You have passage of blood-tinged mucus.  You develop vaginal bleeding.  You develop continuous belly (abdominal) pain.  You have low back pain that you never had before.  You feel the baby's head pushing down causing pelvic pressure.  The baby is not moving as much as it used to. Document Released: 11/27/2005 Document Revised: 02/19/2012 Document Reviewed: 09/08/2013 ExitCare Patient Information 2014 [employer].  Fetal Movement Counts Patient Name: __________________________________________________ Patient Due Date: ____________________ Performing a fetal movement count is highly recommended in high-risk pregnancies, but it is good for every pregnant woman to do. Your caregiver may ask you to start counting fetal movements at 28 weeks of the pregnancy. Fetal movements often increase:  After eating a full meal.  After physical activity.  After eating or drinking something sweet or cold.  At rest. Pay attention to when you feel   the baby is most active. This will help you notice a pattern of your baby's sleep and wake cycles and what factors contribute to an increase in fetal movement. It is important to perform a fetal movement count at the same time each day when your baby is normally most active.  HOW TO COUNT FETAL  MOVEMENTS 1. Find a quiet and comfortable area to sit or lie down on your left side. Lying on your left side provides the best blood and oxygen circulation to your baby. 2. Write down the day and time on a sheet of paper or in a journal. 3. Start counting kicks, flutters, swishes, rolls, or jabs in a 2 hour period. You should feel at least 10 movements within 2 hours. 4. If you do not feel 10 movements in 2 hours, wait 2 3 hours and count again. Look for a change in the pattern or not enough counts in 2 hours. SEEK MEDICAL CARE IF:  You feel less than 10 counts in 2 hours, tried twice.  There is no movement in over an hour.  The pattern is changing or taking longer each day to reach 10 counts in 2 hours.  You feel the baby is not moving as he or she usually does. Date: ____________ Movements: ____________ Start time: ____________ Finish time: ____________  Date: ____________ Movements: ____________ Start time: ____________ Finish time: ____________ Date: ____________ Movements: ____________ Start time: ____________ Finish time: ____________ Date: ____________ Movements: ____________ Start time: ____________ Finish time: ____________ Date: ____________ Movements: ____________ Start time: ____________ Finish time: ____________ Date: ____________ Movements: ____________ Start time: ____________ Finish time: ____________ Date: ____________ Movements: ____________ Start time: ____________ Finish time: ____________ Date: ____________ Movements: ____________ Start time: ____________ Finish time: ____________  Date: ____________ Movements: ____________ Start time: ____________ Finish time: ____________ Date: ____________ Movements: ____________ Start time: ____________ Finish time: ____________ Date: ____________ Movements: ____________ Start time: ____________ Finish time: ____________ Date: ____________ Movements: ____________ Start time: ____________ Finish time: ____________ Date: ____________  Movements: ____________ Start time: ____________ Finish time: ____________ Date: ____________ Movements: ____________ Start time: ____________ Finish time: ____________ Date: ____________ Movements: ____________ Start time: ____________ Finish time: ____________  Date: ____________ Movements: ____________ Start time: ____________ Finish time: ____________ Date: ____________ Movements: ____________ Start time: ____________ Finish time: ____________ Date: ____________ Movements: ____________ Start time: ____________ Finish time: ____________ Date: ____________ Movements: ____________ Start time: ____________ Finish time: ____________ Date: ____________ Movements: ____________ Start time: ____________ Finish time: ____________ Date: ____________ Movements: ____________ Start time: ____________ Finish time: ____________ Date: ____________ Movements: ____________ Start time: ____________ Finish time: ____________  Date: ____________ Movements: ____________ Start time: ____________ Finish time: ____________ Date: ____________ Movements: ____________ Start time: ____________ Finish time: ____________ Date: ____________ Movements: ____________ Start time: ____________ Finish time: ____________ Date: ____________ Movements: ____________ Start time: ____________ Finish time: ____________ Date: ____________ Movements: ____________ Start time: ____________ Finish time: ____________ Date: ____________ Movements: ____________ Start time: ____________ Finish time: ____________ Date: ____________ Movements: ____________ Start time: ____________ Finish time: ____________  Date: ____________ Movements: ____________ Start time: ____________ Finish time: ____________ Date: ____________ Movements: ____________ Start time: ____________ Finish time: ____________ Date: ____________ Movements: ____________ Start time: ____________ Finish time: ____________ Date: ____________ Movements: ____________ Start time:  ____________ Finish time: ____________ Date: ____________ Movements: ____________ Start time: ____________ Finish time: ____________ Date: ____________ Movements: ____________ Start time: ____________ Finish time: ____________ Date: ____________ Movements: ____________ Start time: ____________ Finish time: ____________  Date: ____________ Movements: ____________ Start time: ____________ Finish time: ____________ Date: ____________ Movements: ____________ Start   time: ____________ Finish time: ____________ Date: ____________ Movements: ____________ Start time: ____________ Finish time: ____________ Date: ____________ Movements: ____________ Start time: ____________ Finish time: ____________ Date: ____________ Movements: ____________ Start time: ____________ Finish time: ____________ Date: ____________ Movements: ____________ Start time: ____________ Finish time: ____________ Date: ____________ Movements: ____________ Start time: ____________ Finish time: ____________  Date: ____________ Movements: ____________ Start time: ____________ Finish time: ____________ Date: ____________ Movements: ____________ Start time: ____________ Finish time: ____________ Date: ____________ Movements: ____________ Start time: ____________ Finish time: ____________ Date: ____________ Movements: ____________ Start time: ____________ Finish time: ____________ Date: ____________ Movements: ____________ Start time: ____________ Finish time: ____________ Date: ____________ Movements: ____________ Start time: ____________ Finish time: ____________ Date: ____________ Movements: ____________ Start time: ____________ Finish time: ____________  Date: ____________ Movements: ____________ Start time: ____________ Finish time: ____________ Date: ____________ Movements: ____________ Start time: ____________ Finish time: ____________ Date: ____________ Movements: ____________ Start time: ____________ Finish time: ____________ Date:  ____________ Movements: ____________ Start time: ____________ Finish time: ____________ Date: ____________ Movements: ____________ Start time: ____________ Finish time: ____________ Date: ____________ Movements: ____________ Start time: ____________ Finish time: ____________ Document Released: 12/27/2006 Document Revised: 11/13/2012 Document Reviewed: 09/23/2012 Exit[employer] Patient Information 2014 [employer].  

## 2014-01-29 NOTE — Progress Notes (Signed)
   Barbara Robertson is a 23 y.o. G1P0000 at 7439w0d  admitted for induction of labor due to Hypertension.  Subjective:  Feels as if contractions are more regular/stronger.  Denies HA/visual changes/RUQ pain Objective: BP 144/91  Pulse 102  Temp(Src) 98.4 F (36.9 C) (Oral)  Resp 18  Ht 5\' 10"  (1.778 m)  Wt 111.222 kg (245 lb 3.2 oz)  BMI 35.18 kg/m2  LMP 05/04/2013    FHT:  FHR: 140 bpm, variability: moderate,  accelerations:  Present,  decelerations:  Absent UC:   regular, every 2-3 minutes SVE:   Dilation: 5 Effacement (%): 90 Station: -2 Exam by:: Gardiner Coins McIntosh RN Pitocin @ 3 mu/min  Labs: Lab Results  Component Value Date   WBC 11.1* 01/29/2014   HGB 13.2 01/29/2014   HCT 36.5 01/29/2014   MCV 86.3 01/29/2014   PLT 284 01/29/2014    Assessment / Plan: Augmentation of labor, progressing well  Labor: Progressing normally Fetal Wellbeing:  Category I Pain Control:  Labor support without medications Anticipated MOD:  NSVD  CRESENZO-DISHMAN,Ronni Osterberg 01/29/2014, 11:08 PM

## 2014-01-29 NOTE — MAU Provider Note (Signed)
Barbara Robertson 22 y.o. G1 at 7087w0d [facility] here for [facility]. Cx 4/80/-2. Category 1 FHR tracing. UCs q 4-7 min.  Filed Vitals:   01/29/14 1648 01/29/14 1703 01/29/14 1812 01/29/14 1818  BP: 142/82 146/90 145/94 138/90  Pulse: 81 94 95 95  Temp:      TempSrc:      Resp:      Height:      Weight:        C/W Dr. Macon LargeAnyanwu> will admit, augment if needed.  See Admission H&P Danae OrleansDeirdre C Aino Heckert, CNM 01/29/2014 6:33 PM

## 2014-01-29 NOTE — MAU Note (Signed)
Pt reports ctx q 2-3 min. Denies SROm or bleeding. Good fetal movement reported.

## 2014-01-30 ENCOUNTER — Encounter (HOSPITAL_COMMUNITY): Payer: Self-pay | Admitting: Anesthesiology

## 2014-01-30 ENCOUNTER — Encounter (HOSPITAL_COMMUNITY): Payer: BC Managed Care – PPO | Admitting: Anesthesiology

## 2014-01-30 ENCOUNTER — Inpatient Hospital Stay (HOSPITAL_COMMUNITY): Payer: BC Managed Care – PPO | Admitting: Anesthesiology

## 2014-01-30 DIAGNOSIS — G40909 Epilepsy, unspecified, not intractable, without status epilepticus: Secondary | ICD-10-CM

## 2014-01-30 DIAGNOSIS — O99344 Other mental disorders complicating childbirth: Secondary | ICD-10-CM

## 2014-01-30 DIAGNOSIS — O99354 Diseases of the nervous system complicating childbirth: Secondary | ICD-10-CM

## 2014-01-30 DIAGNOSIS — O139 Gestational [pregnancy-induced] hypertension without significant proteinuria, unspecified trimester: Secondary | ICD-10-CM

## 2014-01-30 LAB — CBC
HCT: 37.9 % (ref 36.0–46.0)
Hemoglobin: 13.6 g/dL (ref 12.0–15.0)
MCH: 31.2 pg (ref 26.0–34.0)
MCHC: 35.9 g/dL (ref 30.0–36.0)
MCV: 86.9 fL (ref 78.0–100.0)
Platelets: 274 10*3/uL (ref 150–400)
RBC: 4.36 MIL/uL (ref 3.87–5.11)
RDW: 12.8 % (ref 11.5–15.5)
WBC: 14.6 10*3/uL — ABNORMAL HIGH (ref 4.0–10.5)

## 2014-01-30 LAB — RPR: RPR Ser Ql: NONREACTIVE

## 2014-01-30 MED ORDER — MISOPROSTOL 200 MCG PO TABS
ORAL_TABLET | ORAL | Status: AC
  Filled 2014-01-30: qty 5

## 2014-01-30 MED ORDER — LIDOCAINE HCL (PF) 1 % IJ SOLN
INTRAMUSCULAR | Status: DC | PRN
  Administered 2014-01-30 (×2): 4 mL

## 2014-01-30 MED ORDER — SIMETHICONE 80 MG PO CHEW: 80.0000 mg | CHEWABLE_TABLET | ORAL | Status: DC | PRN

## 2014-01-30 MED ORDER — TETANUS-DIPHTH-ACELL PERTUSSIS 5-2.5-18.5 LF-MCG/0.5 IM SUSP: 0.5000 mL | Freq: Once | INTRAMUSCULAR | Status: DC

## 2014-01-30 MED ORDER — ONDANSETRON HCL 4 MG/2ML IJ SOLN: 4.0000 mg | INTRAMUSCULAR | Status: DC | PRN

## 2014-01-30 MED ORDER — PRENATAL MULTIVITAMIN CH
1.0000 | ORAL_TABLET | Freq: Every day | ORAL | Status: DC
  Administered 2014-01-31 – 2014-02-01 (×2): 1 via ORAL
  Filled 2014-01-30 (×2): qty 1

## 2014-01-30 MED ORDER — LANOLIN HYDROUS EX OINT: TOPICAL_OINTMENT | CUTANEOUS | Status: DC | PRN

## 2014-01-30 MED ORDER — MISOPROSTOL 200 MCG PO TABS
1000.0000 ug | ORAL_TABLET | Freq: Once | ORAL | Status: AC
  Administered 2014-01-30: 1000 ug via RECTAL

## 2014-01-30 MED ORDER — ZOLPIDEM TARTRATE 5 MG PO TABS: 5.0000 mg | ORAL_TABLET | Freq: Every evening | ORAL | Status: DC | PRN

## 2014-01-30 MED ORDER — DIPHENHYDRAMINE HCL 25 MG PO CAPS: 25.0000 mg | ORAL_CAPSULE | Freq: Four times a day (QID) | ORAL | Status: DC | PRN

## 2014-01-30 MED ORDER — OXYCODONE-ACETAMINOPHEN 5-325 MG PO TABS: 1.0000 | ORAL_TABLET | ORAL | Status: DC | PRN

## 2014-01-30 MED ORDER — IBUPROFEN 600 MG PO TABS
600.0000 mg | ORAL_TABLET | Freq: Four times a day (QID) | ORAL | Status: DC
  Administered 2014-01-30 – 2014-02-01 (×8): 600 mg via ORAL
  Filled 2014-01-30 (×8): qty 1

## 2014-01-30 MED ORDER — BENZOCAINE-MENTHOL 20-0.5 % EX AERO
1.0000 "application " | INHALATION_SPRAY | CUTANEOUS | Status: DC | PRN
  Administered 2014-01-31: 1 via TOPICAL
  Filled 2014-01-30: qty 56

## 2014-01-30 MED ORDER — ONDANSETRON HCL 4 MG PO TABS: 4.0000 mg | ORAL_TABLET | ORAL | Status: DC | PRN

## 2014-01-30 MED ORDER — WITCH HAZEL-GLYCERIN EX PADS: 1.0000 "application " | MEDICATED_PAD | CUTANEOUS | Status: DC | PRN

## 2014-01-30 MED ORDER — OXYTOCIN 40 UNITS IN LACTATED RINGERS INFUSION - SIMPLE MED: 1.0000 m[IU]/min | INTRAVENOUS | Status: DC

## 2014-01-30 MED ORDER — SENNOSIDES-DOCUSATE SODIUM 8.6-50 MG PO TABS
2.0000 | ORAL_TABLET | ORAL | Status: DC
  Administered 2014-01-30: 2 via ORAL
  Filled 2014-01-30 (×2): qty 2

## 2014-01-30 MED ORDER — DIBUCAINE 1 % RE OINT: 1.0000 "application " | TOPICAL_OINTMENT | RECTAL | Status: DC | PRN

## 2014-01-30 NOTE — Progress Notes (Signed)
Pt complained of pressure and grunting with uc.  Pt returned to Semi-Fowlers position following epidural placement.  FHT obtained with cardio.

## 2014-01-30 NOTE — Anesthesia Procedure Notes (Signed)
Epidural Patient location during procedure: OB Start time: 01/30/2014 12:49 PM  Staffing Anesthesiologist: Trejan Buda A. Performed by: anesthesiologist   Preanesthetic Checklist Completed: patient identified, site marked, surgical consent, pre-op evaluation, timeout performed, IV checked, risks and benefits discussed and monitors and equipment checked  Epidural Patient position: sitting Prep: site prepped and draped and DuraPrep Patient monitoring: continuous pulse ox and blood pressure Approach: midline Injection technique: LOR air  Needle:  Needle type: Tuohy  Needle gauge: 17 G Needle length: 9 cm and 9 Needle insertion depth: 8 cm Catheter type: closed end flexible Catheter size: 19 Gauge Catheter at skin depth: 13 cm Test dose: negative and Other  Assessment Events: blood not aspirated, injection not painful, no injection resistance, negative IV test and no paresthesia  Additional Notes Patient identified. Risks and benefits discussed including failed block, incomplete  Pain control, post dural puncture headache, nerve damage, paralysis, blood pressure Changes, nausea, vomiting, reactions to medications-both toxic and allergic and post Partum back pain. All questions were answered. Patient expressed understanding and wished to proceed. Sterile technique was used throughout procedure. Epidural site was Dressed with sterile barrier dressing. No paresthesias, signs of intravascular injection Or signs of intrathecal spread were encountered.  Patient was more comfortable after the epidural was dosed. Please see RN's note for documentation of vital signs and FHR which are stable.

## 2014-01-30 NOTE — Progress Notes (Signed)
Patient ID: Barbara Robertson, female   DOB: Apr 16, 1991, 23 y.o.   MRN: [id number] S: Called to room by RN for increased bleeding.  Patient alert, denies any dizziness or lightheadedness O:  VSS Fundus firm with massage, small oozing of lochia A/P:  increased PP bleeding 1000 mcg cytotec PR now.  Will continue to monitor

## 2014-01-30 NOTE — Progress Notes (Signed)
Barbara Robertson is a [age] y.o. G1P0000 at 6868w1d admitted for augmentation 2/2 advanced cervical dilation and GHTN Subjective:  No complaints at this time. Not feeling much discomfort with the contractions.  Objective: BP 175/109  Pulse 93  Temp(Src) 98.3 F (36.8 C) (Oral)  Resp 20  Ht 5\' 10"  (1.778 m)  Wt 111.222 kg (245 lb 3.2 oz)  BMI 35.18 kg/m2  LMP 05/04/2013      FHT:  FHR: 145 bpm, variability: moderate,  accelerations:  Present,  decelerations:  Absent UC:   regular, every 2-3 minutes SVE:   Dilation: 5 Effacement (%): 90 Station: -1 Exam by:: Barbara Robertson, CNM  Labs: Lab Results  Component Value Date   WBC 11.1* 01/29/2014   HGB 13.2 01/29/2014   HCT 36.5 01/29/2014   MCV 86.3 01/29/2014   PLT 284 01/29/2014    Assessment / Plan: latent phase  Labor: AROM now.  Preeclampsia:  no signs or symptoms of toxicity Fetal Wellbeing:  Category I Pain Control:  Labor support without medications I/D:  n/a Anticipated MOD:  NSVD  Barbara Robertson, Barbara Robertson 01/30/2014, 11:44 AM

## 2014-01-30 NOTE — Progress Notes (Signed)
Barbara Robertson is a 23 y.o. G1P0000 at 8939w1d by US for induction of labor due to Hypertension.  Subjective: No current complaints.   Objective: BP 143/100  Pulse 74  Temp(Src) 98.6 F (37 C) (Oral)  Resp 18  Ht 5\' 10"  (1.778 m)  Wt 111.222 kg (245 lb 3.2 oz)  BMI 35.18 kg/m2  LMP 05/04/2013     FHT:  FHR: 150 bpm, variability: moderate,  accelerations:  Present,  decelerations:  Absent UC:   regular, every 2-3 minutes SVE:   Dilation: 5 Effacement (%): 90 Station: -2 Exam by:: Gardiner Coins McIntosh RN  Labs: Lab Results  Component Value Date   WBC 11.1* 01/29/2014   HGB 13.2 01/29/2014   HCT 36.5 01/29/2014   MCV 86.3 01/29/2014   PLT 284 01/29/2014   Assessment / Plan: Induction of labor due to gestational hypertension,  progressing well on pitocin  Labor: Progressing normally Preeclampsia:  na Fetal Wellbeing:  Category I Pain Control:  none I/D:  n/a Anticipated MOD:  NSVD  Barbara Robertson, Barbara Robertson 01/30/2014, 9:10 AM

## 2014-01-30 NOTE — Anesthesia Preprocedure Evaluation (Signed)
Anesthesia Evaluation  Patient identified by MRN, date of birth, ID band Patient awake    Reviewed: Allergy & Precautions, H&P , Patient's Chart, lab work & pertinent test results  Airway Mallampati: III TM Distance: >3 FB Neck ROM: Full    Dental no notable dental hx. (+) Teeth Intact   Pulmonary neg pulmonary ROS,  breath sounds clear to auscultation  Pulmonary exam normal       Cardiovascular hypertension, Rhythm:Regular Rate:Normal     Neuro/Psych Seizures -,  PSYCHIATRIC DISORDERS    GI/Hepatic negative GI ROS, Neg liver ROS,   Endo/Other  Obesity  Renal/GU negative Renal ROS  negative genitourinary   Musculoskeletal negative musculoskeletal ROS (+)   Abdominal (+) + obese,   Peds  Hematology negative hematology ROS (+)   Anesthesia Other Findings   Reproductive/Obstetrics (+) Pregnancy                           Anesthesia Physical Anesthesia Plan  ASA: II  Anesthesia Plan: Epidural   Post-op Pain Management:    Induction:   Airway Management Planned: Natural Airway  Additional Equipment:   Intra-op Plan:   Post-operative Plan:   Informed Consent: I have reviewed the patients History and Physical, chart, labs and discussed the procedure including the risks, benefits and alternatives for the proposed anesthesia with the patient or authorized representative who has indicated his/her understanding and acceptance.     Plan Discussed with: Anesthesiologist  Anesthesia Plan Comments:         Anesthesia Quick Evaluation

## 2014-01-30 NOTE — Progress Notes (Signed)
I have seen the patient with the resident/student and agree with the above.  Heidy Mccubbin Donovan  

## 2014-01-30 NOTE — Progress Notes (Signed)
1254 Pt moved legs to reveal that head was crowning.  Dr Malen GauzeFoster remains at bedside.  H.[name], CNM called to attend delivery.  H. Mathews RobinsonsHogan, CNM arrived at bedside 1 min after delivery of viable infant.

## 2014-01-31 LAB — CBC
HCT: 34.9 % — ABNORMAL LOW (ref 36.0–46.0)
HEMOGLOBIN: 12.6 g/dL (ref 12.0–15.0)
MCH: 31.4 pg (ref 26.0–34.0)
MCHC: 36.1 g/dL — ABNORMAL HIGH (ref 30.0–36.0)
MCV: 87 fL (ref 78.0–100.0)
Platelets: 255 10*3/uL (ref 150–400)
RBC: 4.01 MIL/uL (ref 3.87–5.11)
RDW: 12.6 % (ref 11.5–15.5)
WBC: 13.6 10*3/uL — ABNORMAL HIGH (ref 4.0–10.5)

## 2014-01-31 LAB — CULTURE, BETA STREP (GROUP B ONLY)

## 2014-01-31 MED ORDER — HYDROCHLOROTHIAZIDE 25 MG PO TABS
25.0000 mg | ORAL_TABLET | Freq: Every day | ORAL | Status: DC
  Administered 2014-01-31: 25 mg via ORAL
  Filled 2014-01-31: qty 1

## 2014-01-31 MED ORDER — IBUPROFEN 600 MG PO TABS: 600.0000 mg | ORAL_TABLET | Freq: Four times a day (QID) | ORAL | Status: DC

## 2014-01-31 MED ORDER — HYDROCHLOROTHIAZIDE 25 MG PO TABS: 25.0000 mg | ORAL_TABLET | Freq: Every day | ORAL | Status: DC

## 2014-01-31 MED ORDER — RHO D IMMUNE GLOBULIN 1500 UNIT/2ML IJ SOLN
300.0000 ug | Freq: Once | INTRAMUSCULAR | Status: AC
  Administered 2014-01-31: 300 ug via INTRAMUSCULAR
  Filled 2014-01-31: qty 2

## 2014-01-31 NOTE — Discharge Instructions (Signed)

## 2014-01-31 NOTE — Discharge Summary (Signed)
Obstetric Discharge Summary Reason for Admission: onset of labor Prenatal Procedures: none Intrapartum Procedures: spontaneous vaginal delivery Postpartum Procedures: none Complications-Operative and Postpartum: none Hemoglobin  Date Value Ref Range Status  01/30/2014 13.6  12.0 - 15.0 g/dL Final     HCT  Date Value Ref Range Status  01/30/2014 37.9  36.0 - 46.0 % Final    Hospital Course:  Pt continues to have elevated BP following SVD at 12:55 on 01/30/14. Protein:Cr ratio=0.05. [facility] for blood pressure check. Discharged with HCTZ. She plans to breastfeed and wants POPs for MOC.  Physical Exam:  General: alert, cooperative and no distress Lochia: appropriate Uterine Fundus: firm Incision: N/A DVT Evaluation: No evidence of DVT seen on physical exam. No significant calf/ankle edema.  Filed Vitals:   01/30/14 1416 01/30/14 1500 01/30/14 1634 01/30/14 2000  BP: 147/85 150/85 141/82 149/84  Pulse: 68 70 83 81  Temp:  98.3 F (36.8 C) 98.4 F (36.9 C) 99.2 F (37.3 C)  TempSrc:  Oral Oral Oral  Resp: 18 20 20 20   Height:      Weight:      SpO2:    94%     Discharge Diagnoses: Term Pregnancy-delivered  Discharge Information: Date: 01/31/2014 Activity: pelvic rest Diet: routine Medications: Ibuprofen and Colace Condition: stable Instructions: refer to practice specific booklet Discharge to: home Follow-up Information   Follow up with [facility] In 4 weeks.   Contact information:   [address] [place] [id number]       Newborn Data: Live born female  Birth Weight: 6 lb 3.7 oz (2825 g) APGAR: , 9  Home with mother.  Duane Bostonuke, Barbara N 01/31/2014, 5:08 AM  I have seen and examined this patient and I agree with the above. Cam HaiSHAW, Barbara 7:50 AM 01/31/2014

## 2014-01-31 NOTE — Progress Notes (Signed)
Reviewed pt status with Dr Reola CalkinsBeck, BP 138/81, HCTZ, discharge order, prescriptions  Dr Reola CalkinsBeck reviewed BPs and infromation.  Dr Reola CalkinsBeck  will talk to pt this after discharge to be cancelled.

## 2014-01-31 NOTE — Progress Notes (Signed)
Dr Reola CalkinsBeck notified of pt BP 156/106 and 164/111 during breast feeding.  Recheck BP 137/95.  BP parameter order received.

## 2014-01-31 NOTE — Anesthesia Postprocedure Evaluation (Signed)
  Anesthesia Post-op Note  Patient: Barbara Robertson  Procedure(s) Performed: * No procedures listed *  Patient Location: Mother/Baby  Anesthesia Type:Epidural  Level of Consciousness: awake  Airway and Oxygen Therapy: Patient Spontanous Breathing  Post-op Pain: mild  Post-op Assessment: Patient's Cardiovascular Status Stable and Respiratory Function Stable  Post-op Vital Signs: stable  Complications: No apparent anesthesia complications

## 2014-02-01 ENCOUNTER — Ambulatory Visit: Payer: Self-pay

## 2014-02-01 LAB — RH IG WORKUP (INCLUDES ABO/RH)
ABO/RH(D): A NEG
Fetal Screen: NEGATIVE
Gestational Age(Wks): 7.1
Unit division: 0

## 2014-02-01 NOTE — Discharge Summary (Signed)
Obstetric Discharge Summary Reason for Admission: induction of labor for [facility] Prenatal Procedures: none Intrapartum Procedures: spontaneous vaginal delivery Postpartum Procedures: none Complications-Operative and Postpartum: none  Hospital Course: Pt presented on 2/19 and was admitted for IOL for GHTN. Pt progressed to deliver on 2/20 vaginally. PP pt has had mild range BPs. Pt did receive 1 dose of HCTZ, but is not discharged on meds at this time. Pt is following up this week in clinic to f/u BP. Met other milestones without issue. Breast feeding and POP for contraception.  Delivery Note At 12:55 PM a healthy female was delivered precipitously via Vaginal, Spontaneous Delivery by RN while CNM/resident in route to the room. Anesthesia MD at bedside at time of delivery.  (Presentation: Left Occiput Anterior).  APGAR: , ; weight .   Placenta status: intact.  Cord: 3 vessels with the following complications: None.  Cord pH: obtained  Anesthesia: Epidural  Episiotomy: None Lacerations: None Suture Repair: na Est. Blood Loss 250 (mL):   Mom to postpartum.  Baby to Couplet care / Skin to Skin.  Phill Myron 01/30/2014, 1:15 PM I have seen the patient with the resident/student and agree with the above.  Mathis Bud     H/H: Lab Results  Component Value Date/Time   HGB 12.6 01/31/2014  5:50 AM   HCT 34.9* 01/31/2014  5:50 AM      Discharge Diagnoses: Term Pregnancy-delivered  Discharge Information: Date: 06/22/2011 Activity: pelvic rest Diet: routine  Medications: PNV and Ibuprofen Breast feeding:  Yes Condition: stable Instructions: refer to handout Discharge to: home        Future Appointments Provider Department Dept Phone   02/02/2014 3:00 PM [facility] [phone]   02/05/2014 10:30 AM [facility] [phone]       Medication List    STOP taking these medications       acetaminophen 325 MG tablet   Commonly known as:  TYLENOL     calcium carbonate 500 MG chewable tablet  Commonly known as:  TUMS - dosed in mg elemental calcium      TAKE these medications       ibuprofen 600 MG tablet  Commonly known as:  ADVIL,MOTRIN  Take 1 tablet (600 mg total) by mouth every 6 (six) hours.     prenatal multivitamin Tabs tablet  Take 1 tablet by mouth at bedtime.       Follow-up Information   Follow up with [facility] In 4 weeks. (Also will have a BP check in the clinic next week.)    Contact information:   [place] [place] [address]       Messiyah Waterson, Lyda Kalata 02/01/2014,7:57 AM

## 2014-02-01 NOTE — Lactation Note (Signed)
This note was copied from the chart of Barbara Renea Stella. Lactation Consultation Note Follow up visit at 53 hours of age.  Mom denies problems at this time.  Baby is in FOBs arms showing feeding cues.  Offered to assist mom declines and reports everything is fine.  LC to follow up as needed.  11 feedings recorded in the past 24 hours with 7 voids and 7 stools.  Patient Name: Barbara Hermina BartersMindy Robertson ZOXWR'UToday's Date: 02/01/2014 Reason for consult: Follow-up assessment   Maternal Data    Feeding Length of feed: 15 min  LATCH Score/Interventions                      Lactation Tools Discussed/Used     Consult Status Consult Status: PRN Follow-up type: In-patient    Shoptaw, Arvella MerlesJana Lynn 02/01/2014, 6:45 PM

## 2014-02-01 NOTE — Discharge Summary (Signed)
BP check this week. Attestation of Attending Supervision of Fellow: Evaluation and management procedures were performed by the Fellow under my supervision and collaboration. I have reviewed the Fellow's note and chart, and I agree with the management and plan.

## 2014-02-02 ENCOUNTER — Encounter: Payer: Self-pay | Admitting: Obstetrics and Gynecology

## 2014-02-02 ENCOUNTER — Other Ambulatory Visit: Payer: BC Managed Care – PPO

## 2014-02-03 ENCOUNTER — Encounter: Payer: Self-pay | Admitting: Family

## 2014-02-03 ENCOUNTER — Encounter (HOSPITAL_COMMUNITY): Payer: Self-pay | Admitting: *Deleted

## 2014-02-05 ENCOUNTER — Other Ambulatory Visit: Payer: BC Managed Care – PPO

## 2014-02-11 ENCOUNTER — Telehealth: Payer: Self-pay | Admitting: Obstetrics & Gynecology

## 2014-02-11 NOTE — Telephone Encounter (Signed)
Called patient to inform her she needs to make an appointment to get BP check. Left message for her to call back and schedule appointment.

## 2014-02-13 ENCOUNTER — Encounter: Payer: Self-pay | Admitting: Nurse Practitioner

## 2014-02-13 ENCOUNTER — Ambulatory Visit (INDEPENDENT_AMBULATORY_CARE_PROVIDER_SITE_OTHER): Payer: BC Managed Care – PPO | Admitting: Nurse Practitioner

## 2014-02-13 ENCOUNTER — Ambulatory Visit: Payer: BC Managed Care – PPO | Admitting: Nurse Practitioner

## 2014-02-13 VITALS — BP 130/80 | Ht 71.0 in | Wt 224.0 lb

## 2014-02-13 DIAGNOSIS — Z3009 Encounter for other general counseling and advice on contraception: Secondary | ICD-10-CM

## 2014-02-13 MED ORDER — NORETHINDRONE 0.35 MG PO TABS: 1.0000 | ORAL_TABLET | Freq: Every day | ORAL | Status: DC

## 2014-02-14 ENCOUNTER — Encounter: Payer: Self-pay | Admitting: Nurse Practitioner

## 2014-02-14 NOTE — Progress Notes (Signed)
Subjective:  Presents to discuss her birth control options. Gave birth 2 weeks ago. Is currently breast-feeding. Has plans to conceive again sometime in the next 1-2 years. Does not want any form of birth control that will take "a while to get out of her system".  Objective:   BP 130/80  Ht 5\' 11"  (1.803 m)  Wt 224 lb (101.606 kg)  BMI 31.26 kg/m2  LMP 05/04/2013 NAD. Alert, oriented. Lungs clear. Heart regular rate rhythm.  Assessment:Other general counseling and advice for contraceptive management  Plan: Discussed options. Patient elects to start birth control pills. Meds ordered this encounter  Medications  . norethindrone (MICRONOR,CAMILA,ERRIN) 0.35 MG tablet    Sig: Take 1 tablet (0.35 mg total) by mouth daily.    Dispense:  1 Package    Refill:  11    Please advise patient to take pills continuously/nonstop; it is normal for her to stop bleeding    Order Specific Question:  Supervising Provider    Answer:  Merlyn AlbertLUKING, WILLIAM S [[id number]]   This is the safest choice for breast-feeding mother. Call back if any problems.

## 2014-02-26 ENCOUNTER — Ambulatory Visit: Payer: BC Managed Care – PPO | Admitting: Family Medicine

## 2014-03-06 ENCOUNTER — Ambulatory Visit (INDEPENDENT_AMBULATORY_CARE_PROVIDER_SITE_OTHER): Payer: BC Managed Care – PPO | Admitting: Obstetrics and Gynecology

## 2014-03-06 NOTE — Patient Instructions (Signed)
Breastfeeding Deciding to breastfeed is one of the best choices you can make for you and your baby. A change in hormones during pregnancy causes your breast tissue to grow and increases the number and size of your milk ducts. These hormones also allow proteins, sugars, and fats from your blood supply to make breast milk in your milk-producing glands. Hormones prevent breast milk from being released before your baby is born as well as prompt milk flow after birth. Once breastfeeding has begun, thoughts of your baby, as well as his or her sucking or crying, can stimulate the release of milk from your milk-producing glands.  BENEFITS OF BREASTFEEDING For Your Baby  Your first milk (colostrum) helps your baby's digestive system function better.   There are antibodies in your milk that help your baby fight off infections.   Your baby has a lower incidence of asthma, allergies, and sudden infant death syndrome.   The nutrients in breast milk are better for your baby than infant formulas and are designed uniquely for your baby's needs.   Breast milk improves your baby's brain development.   Your baby is less likely to develop other conditions, such as childhood obesity, asthma, or type 2 diabetes mellitus.  For You   Breastfeeding helps to create a very special bond between you and your baby.   Breastfeeding is convenient. Breast milk is always available at the correct temperature and costs nothing.   Breastfeeding helps to burn calories and helps you lose the weight gained during pregnancy.   Breastfeeding makes your uterus contract to its prepregnancy size faster and slows bleeding (lochia) after you give birth.   Breastfeeding helps to lower your risk of developing type 2 diabetes mellitus, osteoporosis, and breast or ovarian cancer later in life. SIGNS THAT YOUR BABY IS HUNGRY Early Signs of Hunger  Increased alertness or activity.  Stretching.  Movement of the head from  side to side.  Movement of the head and opening of the mouth when the corner of the mouth or cheek is stroked (rooting).  Increased sucking sounds, smacking lips, cooing, sighing, or squeaking.  Hand-to-mouth movements.  Increased sucking of fingers or hands. Late Signs of Hunger  Fussing.  Intermittent crying. Extreme Signs of Hunger Signs of extreme hunger will require calming and consoling before your baby will be able to breastfeed successfully. Do not wait for the following signs of extreme hunger to occur before you initiate breastfeeding:   Restlessness.  A loud, strong cry.   Screaming. BREASTFEEDING BASICS Breastfeeding Initiation  Find a comfortable place to sit or lie down, with your neck and back well supported.  Place a pillow or rolled up blanket under your baby to bring him or her to the level of your breast (if you are seated). Nursing pillows are specially designed to help support your arms and your baby while you breastfeed.  Make sure that your baby's abdomen is facing your abdomen.   Gently massage your breast. With your fingertips, massage from your chest wall toward your nipple in a circular motion. This encourages milk flow. You may need to continue this action during the feeding if your milk flows slowly.  Support your breast with 4 fingers underneath and your thumb above your nipple. Make sure your fingers are well away from your nipple and your baby's mouth.   Stroke your baby's lips gently with your finger or nipple.   When your baby's mouth is open wide enough, quickly bring your baby to your   breast, placing your entire nipple and as much of the colored area around your nipple (areola) as possible into your baby's mouth.   More areola should be visible above your baby's upper lip than below the lower lip.   Your baby's tongue should be between his or her lower gum and your breast.   Ensure that your baby's mouth is correctly positioned  around your nipple (latched). Your baby's lips should create a seal on your breast and be turned out (everted).  It is common for your baby to suck about 2 3 minutes in order to start the flow of breast milk. Latching Teaching your baby how to latch on to your breast properly is very important. An improper latch can cause nipple pain and decreased milk supply for you and poor weight gain in your baby. Also, if your baby is not latched onto your nipple properly, he or she may swallow some air during feeding. This can make your baby fussy. Burping your baby when you switch breasts during the feeding can help to get rid of the air. However, teaching your baby to latch on properly is still the best way to prevent fussiness from swallowing air while breastfeeding. Signs that your baby has successfully latched on to your nipple:    Silent tugging or silent sucking, without causing you pain.   Swallowing heard between every 3 4 sucks.    Muscle movement above and in front of his or her ears while sucking.  Signs that your baby has not successfully latched on to nipple:   Sucking sounds or smacking sounds from your baby while breastfeeding.  Nipple pain. If you think your baby has not latched on correctly, slip your finger into the corner of your baby's mouth to break the suction and place it between your baby's gums. Attempt breastfeeding initiation again. Signs of Successful Breastfeeding Signs from your baby:   A gradual decrease in the number of sucks or complete cessation of sucking.   Falling asleep.   Relaxation of his or her body.   Retention of a small amount of milk in his or her mouth.   Letting go of your breast by himself or herself. Signs from you:  Breasts that have increased in firmness, weight, and size 1 3 hours after feeding.   Breasts that are softer immediately after breastfeeding.  Increased milk volume, as well as a change in milk consistency and color by  the 5th day of breastfeeding.   Nipples that are not sore, cracked, or bleeding. Signs That Your Baby is Getting Enough Milk  Wetting at least 3 diapers in a 24-hour period. The urine should be clear and pale yellow by age 5 days.  At least 3 stools in a 24-hour period by age 5 days. The stool should be soft and yellow.  At least 3 stools in a 24-hour period by age 7 days. The stool should be seedy and yellow.  No loss of weight greater than 10% of birth weight during the first 3 days of age.  Average weight gain of 4 7 ounces (120 210 mL) per week after age 4 days.  Consistent daily weight gain by age 5 days, without weight loss after the age of 2 weeks. After a feeding, your baby may spit up a small amount. This is common. BREASTFEEDING FREQUENCY AND DURATION Frequent feeding will help you make more milk and can prevent sore nipples and breast engorgement. Breastfeed when you feel the need to reduce   the fullness of your breasts or when your baby shows signs of hunger. This is called "breastfeeding on demand." Avoid introducing a pacifier to your baby while you are working to establish breastfeeding (the first 4 6 weeks after your baby is born). After this time you may choose to use a pacifier. Research has shown that pacifier use during the first year of a baby's life decreases the risk of sudden infant death syndrome (SIDS). Allow your baby to feed on each breast as long as he or she wants. Breastfeed until your baby is finished feeding. When your baby unlatches or falls asleep while feeding from the first breast, offer the second breast. Because newborns are often sleepy in the first few weeks of life, you may need to awaken your baby to get him or her to feed. Breastfeeding times will vary from baby to baby. However, the following rules can serve as a guide to help you ensure that your baby is properly fed:  Newborns (babies 4 weeks of age or younger) may breastfeed every 1 3  hours.  Newborns should not go longer than 3 hours during the day or 5 hours during the night without breastfeeding.  You should breastfeed your baby a minimum of 8 times in a 24-hour period until you begin to introduce solid foods to your baby at around 6 months of age. BREAST MILK PUMPING Pumping and storing breast milk allows you to ensure that your baby is exclusively fed your breast milk, even at times when you are unable to breastfeed. This is especially important if you are going back to work while you are still breastfeeding or when you are not able to be present during feedings. Your lactation consultant can give you guidelines on how long it is safe to store breast milk.  A breast pump is a machine that allows you to pump milk from your breast into a sterile bottle. The pumped breast milk can then be stored in a refrigerator or freezer. Some breast pumps are operated by hand, while others use electricity. Ask your lactation consultant which type will work best for you. Breast pumps can be purchased, but some hospitals and breastfeeding support groups lease breast pumps on a monthly basis. A lactation consultant can teach you how to hand express breast milk, if you prefer not to use a pump.  CARING FOR YOUR BREASTS WHILE YOU BREASTFEED Nipples can become dry, cracked, and sore while breastfeeding. The following recommendations can help keep your breasts moisturized and healthy:  Avoid using soap on your nipples.   Wear a supportive bra. Although not required, special nursing bras and tank tops are designed to allow access to your breasts for breastfeeding without taking off your entire bra or top. Avoid wearing underwire style bras or extremely tight bras.  Air dry your nipples for 3 4minutes after each feeding.   Use only cotton bra pads to absorb leaked breast milk. Leaking of breast milk between feedings is normal.   Use lanolin on your nipples after breastfeeding. Lanolin helps to  maintain your skin's normal moisture barrier. If you use pure lanolin you do not need to wash it off before feeding your baby again. Pure lanolin is not toxic to your baby. You may also hand express a few drops of breast milk and gently massage that milk into your nipples and allow the milk to air dry. In the first few weeks after giving birth, some women experience extremely full breasts (engorgement). Engorgement can make   your breasts feel heavy, warm, and tender to the touch. Engorgement peaks within 3 5 days after you give birth. The following recommendations can help ease engorgement:  Completely empty your breasts while breastfeeding or pumping. You may want to start by applying warm, moist heat (in the shower or with warm water-soaked hand towels) just before feeding or pumping. This increases circulation and helps the milk flow. If your baby does not completely empty your breasts while breastfeeding, pump any extra milk after he or she is finished.  Wear a snug bra (nursing or regular) or tank top for 1 2 days to signal your body to slightly decrease milk production.  Apply ice packs to your breasts, unless this is too uncomfortable for you.  Make sure that your baby is latched on and positioned properly while breastfeeding. If engorgement persists after 48 hours of following these recommendations, contact your health care provider or a lactation consultant. OVERALL HEALTH CARE RECOMMENDATIONS WHILE BREASTFEEDING  Eat healthy foods. Alternate between meals and snacks, eating 3 of each per day. Because what you eat affects your breast milk, some of the foods may make your baby more irritable than usual. Avoid eating these foods if you are sure that they are negatively affecting your baby.  Drink milk, fruit juice, and water to satisfy your thirst (about 10 glasses a day).   Rest often, relax, and continue to take your prenatal vitamins to prevent fatigue, stress, and anemia.  Continue  breast self-awareness checks.  Avoid chewing and smoking tobacco.  Avoid alcohol and drug use. Some medicines that may be harmful to your baby can pass through breast milk. It is important to ask your health care provider before taking any medicine, including all over-the-counter and prescription medicine as well as vitamin and herbal supplements. It is possible to become pregnant while breastfeeding. If birth control is desired, ask your health care provider about options that will be safe for your baby. SEEK MEDICAL CARE IF:   You feel like you want to stop breastfeeding or have become frustrated with breastfeeding.  You have painful breasts or nipples.  Your nipples are cracked or bleeding.  Your breasts are red, tender, or warm.  You have a swollen area on either breast.  You have a fever or chills.  You have nausea or vomiting.  You have drainage other than breast milk from your nipples.  Your breasts do not become full before feedings by the 5th day after you give birth.  You feel sad and depressed.  Your baby is too sleepy to eat well.  Your baby is having trouble sleeping.   Your baby is wetting less than 3 diapers in a 24-hour period.  Your baby has less than 3 stools in a 24-hour period.  Your baby's skin or the white part of his or her eyes becomes yellow.   Your baby is not gaining weight by 5 days of age. SEEK IMMEDIATE MEDICAL CARE IF:   Your baby is overly tired (lethargic) and does not want to wake up and feed.  Your baby develops an unexplained fever. Document Released: 11/27/2005 Document Revised: 07/30/2013 Document Reviewed: 05/21/2013 [employer] Patient Information 2014 [employer].  

## 2014-03-06 NOTE — Progress Notes (Signed)
  Subjective:     Barbara Robertson is a 23 y.o. female G1P1001 who presents for a postpartum visit. She is 5 weeks postpartum following a spontaneous vaginal delivery. I have fully reviewed the prenatal and intrapartum course. The delivery was at 37.0 gestational weeks. Outcome: spontaneous vaginal delivery. Anesthesia: epidural. Postpartum course has been uncomplicated. Baby's course has been uncomplicated. Baby is feeding by breast. Bleeding no bleeding. Bowel function is normal. Bladder function is normal. Patient is sexually active. Contraception method is progestin only OCP. Postpartum depression screening: negative.  The following portions of the patient's history were reviewed and updated as appropriate: allergies, current medications, past family history, past medical history, past social history, past surgical history and problem list.  Review of Systems Pertinent items are noted in HPI.   Objective:    BP 125/89  Pulse 102  Temp(Src) 98.4 F (36.9 C)  Wt 102.241 kg (225 lb 6.4 oz)  LMP 02/17/2014  Breastfeeding? No  General:  alert, cooperative and no distress   Breasts:  inspection negative, no nipple discharge or bleeding, no masses or nodularity palpable  Lungs: clear to auscultation bilaterally  Heart:  regular rate and rhythm, S1, S2 normal, no murmur, click, rub or gallop  Abdomen: soft, non-tender; bowel sounds normal; no masses,  no organomegaly                          Assessment:     5 wks postpartum exam.Doing well  Pap smear not done at today's visit.   Plan:    1. Contraception: oral progesterone-only contraceptive 2. Cont PNV until 6wks PP 3. Follow up in: 6 months or as needed.

## 2014-04-29 ENCOUNTER — Ambulatory Visit (INDEPENDENT_AMBULATORY_CARE_PROVIDER_SITE_OTHER): Payer: BC Managed Care – PPO | Admitting: Family Medicine

## 2014-04-29 ENCOUNTER — Encounter: Payer: Self-pay | Admitting: Family Medicine

## 2014-04-29 VITALS — BP 140/90 | Temp 99.8°F | Ht 71.0 in | Wt 226.0 lb

## 2014-04-29 DIAGNOSIS — J45909 Unspecified asthma, uncomplicated: Secondary | ICD-10-CM

## 2014-04-29 DIAGNOSIS — J683 Other acute and subacute respiratory conditions due to chemicals, gases, fumes and vapors: Secondary | ICD-10-CM

## 2014-04-29 DIAGNOSIS — J209 Acute bronchitis, unspecified: Secondary | ICD-10-CM

## 2014-04-29 MED ORDER — CEFDINIR 300 MG PO CAPS: 300.0000 mg | ORAL_CAPSULE | Freq: Two times a day (BID) | ORAL | Status: DC

## 2014-04-29 MED ORDER — ALBUTEROL SULFATE HFA 108 (90 BASE) MCG/ACT IN AERS: 2.0000 | INHALATION_SPRAY | Freq: Four times a day (QID) | RESPIRATORY_TRACT | Status: DC | PRN

## 2014-04-29 NOTE — Progress Notes (Signed)
   Subjective:    Patient ID: Barbara ShanksMindy B Robertson, female    DOB: 1991-08-17, 23 y.o.   MRN: [id number]  Cough This is a new problem. Episode onset: Monday  The problem has been gradually worsening. The problem occurs every few minutes. Associated symptoms include chest pain, a fever, a sore throat and wheezing. Treatments tried: Motrin and Tylenol. The treatment provided mild relief.   tmax 102 and 103  No hx wheezing But has noted definite wheeziness this time.  Cough is productive of phlegm at times. No blood. Whole body aches and feels bad   Review of Systems  Constitutional: Positive for fever.  HENT: Positive for sore throat.   Respiratory: Positive for cough and wheezing.   Cardiovascular: Positive for chest pain.       Objective:   Physical Exam  Alert mild malaise. HEENT moderate his congestion for a small neck supple. Lungs bilateral rhonchi and wheezes no tachypnea no crackles      Assessment & Plan:  Impression viral syndrome with bronchitis element of reactive airways. Plan symptomatic care discussed. Antibiotics prescribed. Ventolin 2 sprays Q46 parents. WSL

## 2014-08-05 ENCOUNTER — Ambulatory Visit (INDEPENDENT_AMBULATORY_CARE_PROVIDER_SITE_OTHER): Payer: BC Managed Care – PPO | Admitting: Nurse Practitioner

## 2014-08-05 ENCOUNTER — Encounter: Payer: Self-pay | Admitting: Nurse Practitioner

## 2014-08-05 ENCOUNTER — Telehealth: Payer: Self-pay | Admitting: Nurse Practitioner

## 2014-08-05 VITALS — BP 140/80 | Ht 71.0 in | Wt 232.0 lb

## 2014-08-05 DIAGNOSIS — N926 Irregular menstruation, unspecified: Secondary | ICD-10-CM

## 2014-08-05 DIAGNOSIS — N939 Abnormal uterine and vaginal bleeding, unspecified: Secondary | ICD-10-CM

## 2014-08-05 MED ORDER — LEVONORGESTREL-ETHINYL ESTRAD 0.1-20 MG-MCG PO TABS: 1.0000 | ORAL_TABLET | Freq: Every day | ORAL | Status: DC

## 2014-08-05 MED ORDER — PHENTERMINE HCL 37.5 MG PO TABS
37.5000 mg | ORAL_TABLET | Freq: Every day | ORAL | Status: DC
Start: 2014-08-05 — End: 2014-09-04

## 2014-08-05 NOTE — Telephone Encounter (Signed)
Pt states that a [facility] script was supposed to be called in today  Along with her phentermine, that script is in but nothing for the  [facility]    [facility]

## 2014-08-05 NOTE — Telephone Encounter (Signed)
Patient was notified.

## 2014-08-05 NOTE — Telephone Encounter (Signed)
Will send in now

## 2014-08-11 ENCOUNTER — Encounter: Payer: Self-pay | Admitting: Nurse Practitioner

## 2014-08-11 NOTE — Progress Notes (Signed)
Subjective:  Presents for complaints of breakthrough menstrual bleeding. Had a normal menstrual cycle about 2 weeks ago. Is not breast-feeding. Has been on Micronor for 5-6 months. No missed pills. Still having a cycle once a month, normal flow lasting 7 days. Also spotting in between. Can last up to 3-4 days. Last intercourse was 4-5 days ago. Is not bleeding at this time. Same sexual partner. No vaginal discharge. No pelvic pain. Also would like to restart phentermine to help with her weight loss.  Objective:   BP 140/80  Ht  (1.803 m)  Wt 232 lb (105.235 kg)  BMI 32.37 kg/m2  LMP 07/16/2014 NAD. Alert, oriented. Lungs clear. Heart regular rate rhythm.     Assessment:  Problem List Items Addressed This Visit     Other   Morbid obesity   Relevant Medications      phentermine (ADIPEX-P) 37.5 MG tablet    Other Visit Diagnoses   Abnormal uterine bleeding    -  Primary       Plan:  Meds ordered this encounter  Medications  . phentermine (ADIPEX-P) 37.5 MG tablet    Sig: Take 1 tablet (37.5 mg total) by mouth daily before breakfast.    Dispense:  30 tablet    Refill:  0    Order Specific Question:  Supervising Provider    Answer:  Merlyn Albert [[id number]]  . levonorgestrel-ethinyl estradiol (AVIANE,ALESSE,LESSINA) 0.1-20 MG-MCG tablet    Sig: Take 1 tablet by mouth daily.    Dispense:  1 Package    Refill:  11    Order Specific Question:  Supervising Provider    Answer:  Riccardo Dubin   Advised patient not to take phentermine if she is pregnant. Reviewed potential adverse effects of phentermine, DC med and call if any problems. Switch to Alesse,. Micronor. Call back if breakthrough bleeding continues. Encouraged healthy diet and regular activity. Return in about 1 month (around 09/05/2014).

## 2014-09-02 ENCOUNTER — Ambulatory Visit: Payer: BC Managed Care – PPO | Admitting: Nurse Practitioner

## 2014-09-04 ENCOUNTER — Ambulatory Visit (INDEPENDENT_AMBULATORY_CARE_PROVIDER_SITE_OTHER): Payer: BC Managed Care – PPO | Admitting: Nurse Practitioner

## 2014-09-04 MED ORDER — PHENTERMINE HCL 37.5 MG PO TABS: 37.5000 mg | ORAL_TABLET | Freq: Every day | ORAL | Status: DC

## 2014-09-06 ENCOUNTER — Encounter: Payer: Self-pay | Admitting: Nurse Practitioner

## 2014-09-06 NOTE — Progress Notes (Signed)
Subjective:  Presents to discuss her weight loss medicine. Active lifestyle. No specific regular exercise. Doing much better with her diet. Denies any adverse affects from phentermine. No plans for pregnancy at this time.  Objective:   BP 130/80  Ht  (1.803 m)  Wt 229 lb (103.874 kg)  BMI 31.95 kg/m2  LMP 07/16/2014 NAD. Alert, oriented. Lungs clear. Heart regular rate rhythm.  Assessment:  Problem List Items Addressed This Visit     Other   Morbid obesity - Primary   Relevant Medications      phentermine (ADIPEX-P) 37.5 MG tablet     Plan:  Meds ordered this encounter  Medications  . phentermine (ADIPEX-P) 37.5 MG tablet    Sig: Take 1 tablet (37.5 mg total) by mouth daily before breakfast.    Dispense:  30 tablet    Refill:  2    Order Specific Question:  Supervising Provider    Answer:  Merlyn Albert [[id number]]   Recommend that she discontinue medication if no significant weight loss. Recommend regular exercise and healthy diet. Return in about 3 months (around 12/04/2014).

## 2014-10-09 ENCOUNTER — Ambulatory Visit (INDEPENDENT_AMBULATORY_CARE_PROVIDER_SITE_OTHER): Payer: BC Managed Care – PPO | Admitting: *Deleted

## 2014-10-09 DIAGNOSIS — Z23 Encounter for immunization: Secondary | ICD-10-CM

## 2014-10-12 ENCOUNTER — Encounter: Payer: Self-pay | Admitting: Nurse Practitioner

## 2014-11-11 ENCOUNTER — Ambulatory Visit: Payer: BC Managed Care – PPO | Admitting: Nurse Practitioner

## 2014-11-24 ENCOUNTER — Encounter: Payer: Self-pay | Admitting: *Deleted

## 2015-01-18 ENCOUNTER — Telehealth: Payer: Self-pay | Admitting: General Practice

## 2015-01-18 DIAGNOSIS — Z3041 Encounter for surveillance of contraceptive pills: Secondary | ICD-10-CM

## 2015-01-18 MED ORDER — NORGESTIMATE-ETH ESTRADIOL 0.25-35 MG-MCG PO TABS: 1.0000 | ORAL_TABLET | Freq: Every day | ORAL | Status: DC
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
## 2015-02-17 ENCOUNTER — Ambulatory Visit: Payer: Medicaid Other | Admitting: Obstetrics & Gynecology

## 2015-02-18 ENCOUNTER — Encounter: Payer: Self-pay | Admitting: Nurse Practitioner

## 2015-02-18 ENCOUNTER — Ambulatory Visit (INDEPENDENT_AMBULATORY_CARE_PROVIDER_SITE_OTHER): Payer: 59 | Admitting: Nurse Practitioner

## 2015-02-18 ENCOUNTER — Telehealth: Payer: Self-pay | Admitting: Family Medicine

## 2015-02-18 VITALS — BP 122/88 | Ht 71.0 in | Wt 224.0 lb

## 2015-02-18 DIAGNOSIS — F53 Postpartum depression: Secondary | ICD-10-CM

## 2015-02-18 DIAGNOSIS — F419 Anxiety disorder, unspecified: Secondary | ICD-10-CM | POA: Diagnosis not present

## 2015-02-18 DIAGNOSIS — O99345 Other mental disorders complicating the puerperium: Principal | ICD-10-CM

## 2015-02-18 MED ORDER — CITALOPRAM HYDROBROMIDE 20 MG PO TABS: ORAL_TABLET | ORAL | Status: DC

## 2015-02-18 NOTE — Telephone Encounter (Signed)
Rx sent electronically to pharmacy. Patient notified. 

## 2015-02-18 NOTE — Telephone Encounter (Signed)
Pt states [facility] states they did not receive her prescription that we sent in this morning for Celexa, please resend, patient currently in town waiting  Please call patient when done

## 2015-02-20 ENCOUNTER — Encounter: Payer: Self-pay | Admitting: Nurse Practitioner

## 2015-02-20 NOTE — Progress Notes (Signed)
Subjective:  Presents for c/o depression which began after her baby was born. No change with birth control pills. Denies any missed pills. Difficulty sleeping. Worries all the time. Emotional lability. Social isolation. Does not have many outside friends. Some relationship issues which have improved. Not breastfeeding. Non smoker. No alcohol or drug use. Denies suicidal or homicidal thoughts or ideation.   Objective:   BP 122/88 mmHg  Ht 5\' 11"  (1.803 m)  Wt 224 lb (101.606 kg)  BMI 31.26 kg/m2  LMP 02/02/2015 (Approximate) NAD. Alert, oriented. Emotional at times during visit. Thoughts logical, coherent and relevant. Lungs clear. Heart RRR.   Assessment: Post partum depression  Anxiety  Plan:  Meds ordered this encounter  Medications  . DISCONTD: citalopram (CELEXA) 20 MG tablet    Sig: 1/2 po qhs x 6 d then one po qhs    Dispense:  30 tablet    Refill:  0    Order Specific Question:  Supervising Provider    Answer:  Merlyn AlbertLUKING, WILLIAM S [[id number]]   DC med and call if any adverse effects. Encouraged regular exercise as well as activities and friendships outside of home.  Return in about 1 month (around 03/21/2015).

## 2015-03-22 ENCOUNTER — Ambulatory Visit: Payer: 59 | Admitting: Nurse Practitioner

## 2015-04-08 ENCOUNTER — Encounter: Payer: Self-pay | Admitting: Nurse Practitioner

## 2015-04-08 ENCOUNTER — Ambulatory Visit (INDEPENDENT_AMBULATORY_CARE_PROVIDER_SITE_OTHER): Payer: 59 | Admitting: Nurse Practitioner

## 2015-04-08 VITALS — BP 142/90 | Ht 71.0 in | Wt 231.2 lb

## 2015-04-08 DIAGNOSIS — N912 Amenorrhea, unspecified: Secondary | ICD-10-CM

## 2015-04-08 DIAGNOSIS — F419 Anxiety disorder, unspecified: Secondary | ICD-10-CM

## 2015-04-08 DIAGNOSIS — Z3041 Encounter for surveillance of contraceptive pills: Secondary | ICD-10-CM

## 2015-04-08 LAB — POCT URINE PREGNANCY: Preg Test, Ur: NEGATIVE

## 2015-04-08 MED ORDER — CITALOPRAM HYDROBROMIDE 20 MG PO TABS: ORAL_TABLET | ORAL | Status: DC

## 2015-04-08 MED ORDER — NORGESTIMATE-ETH ESTRADIOL 0.25-35 MG-MCG PO TABS: 1.0000 | ORAL_TABLET | Freq: Every day | ORAL | Status: DC

## 2015-04-08 NOTE — Patient Instructions (Signed)
[email or web address]

## 2015-04-11 ENCOUNTER — Encounter: Payer: Self-pay | Admitting: Nurse Practitioner

## 2015-04-11 NOTE — Progress Notes (Signed)
Subjective:  Presents for follow-up on her anxiety. Stop Celexa on her own about a week ago, states she feels fine off of it. Last normal menstrual cycle was at the beginning of April lasting about 2 days. Missed 2 pills in her last pack. Patient desires another child at this time, her husband wants her to wait. Requesting refill of her birth control pills.  Objective:   BP 142/90 mmHg  Ht 5\' 11"  (1.803 m)  Wt 231 lb 3.2 oz (104.872 kg)  BMI 32.26 kg/m2 NAD. Alert, oriented. Lungs clear. Heart regular rate rhythm. Cheerful affect. Results for orders placed or performed in visit on 04/08/15  POCT urine pregnancy  Result Value Ref Range   Preg Test, Ur Negative      Assessment:  Problem List Items Addressed This Visit    None    Visit Diagnoses    Anxiety    -  Primary    Relevant Medications    citalopram (CELEXA) 20 MG tablet    Amenorrhea        Relevant Orders    POCT urine pregnancy (Completed)    Encounter for surveillance of contraceptive pills        Relevant Medications    norgestimate-ethinyl estradiol (ORTHO-CYCLEN,SPRINTEC,PREVIFEM) 0.25-35 MG-MCG tablet      Plan:  Meds ordered this encounter  Medications  . citalopram (CELEXA) 20 MG tablet    Sig: 1/2 po qhs x 6 d then one po qhs    Dispense:  30 tablet    Refill:  2    Order Specific Question:  Supervising Provider    Answer:  Merlyn AlbertLUKING, WILLIAM S [[id number]]  . norgestimate-ethinyl estradiol (ORTHO-CYCLEN,SPRINTEC,PREVIFEM) 0.25-35 MG-MCG tablet    Sig: Take 1 tablet by mouth daily.    Dispense:  1 Package    Refill:  11    Order Specific Question:  Supervising Provider    Answer:  Merlyn AlbertLUKING, WILLIAM S [[id number]]   Discussed contraceptive options. Patient defers other methods at this time. Reminded patient to take her pills daily. Given prescription for Celexa per her request for her to hold. Plans to stay off medication at this time. Recheck here as needed.

## 2015-07-21 ENCOUNTER — Emergency Department (HOSPITAL_COMMUNITY): Payer: 59

## 2015-07-21 ENCOUNTER — Emergency Department (HOSPITAL_COMMUNITY)
Admission: EM | Admit: 2015-07-21 | Discharge: 2015-07-21 | Disposition: A | Discharge status: Home / Self Care | Payer: 59 | Attending: Emergency Medicine | Admitting: Emergency Medicine

## 2015-07-21 ENCOUNTER — Encounter (HOSPITAL_COMMUNITY): Payer: Self-pay | Admitting: Emergency Medicine

## 2015-07-21 DIAGNOSIS — Z793 Long term (current) use of hormonal contraceptives: Secondary | ICD-10-CM | POA: Diagnosis not present

## 2015-07-21 DIAGNOSIS — Z8659 Personal history of other mental and behavioral disorders: Secondary | ICD-10-CM | POA: Diagnosis not present

## 2015-07-21 DIAGNOSIS — R569 Unspecified convulsions: Secondary | ICD-10-CM | POA: Insufficient documentation

## 2015-07-21 DIAGNOSIS — Z3202 Encounter for pregnancy test, result negative: Secondary | ICD-10-CM | POA: Diagnosis not present

## 2015-07-21 LAB — CBC WITH DIFFERENTIAL/PLATELET
BASOS ABS: 0 10*3/uL (ref 0.0–0.1)
Basophils Relative: 0 % (ref 0–1)
EOS ABS: 0.1 10*3/uL (ref 0.0–0.7)
EOS PCT: 0 % (ref 0–5)
HCT: 40.5 % (ref 36.0–46.0)
Hemoglobin: 14.3 g/dL (ref 12.0–15.0)
Lymphocytes Relative: 9 % — ABNORMAL LOW (ref 12–46)
Lymphs Abs: 1.3 10*3/uL (ref 0.7–4.0)
MCH: 30.3 pg (ref 26.0–34.0)
MCHC: 35.3 g/dL (ref 30.0–36.0)
MCV: 85.8 fL (ref 78.0–100.0)
Monocytes Absolute: 0.5 10*3/uL (ref 0.1–1.0)
Monocytes Relative: 4 % (ref 3–12)
Neutro Abs: 11.5 10*3/uL — ABNORMAL HIGH (ref 1.7–7.7)
Neutrophils Relative %: 87 % — ABNORMAL HIGH (ref 43–77)
PLATELETS: 333 10*3/uL (ref 150–400)
RBC: 4.72 MIL/uL (ref 3.87–5.11)
RDW: 12.4 % (ref 11.5–15.5)
WBC: 13.4 10*3/uL — ABNORMAL HIGH (ref 4.0–10.5)

## 2015-07-21 LAB — COMPREHENSIVE METABOLIC PANEL
ALBUMIN: 4.4 g/dL (ref 3.5–5.0)
ALT: 15 U/L (ref 14–54)
AST: 17 U/L (ref 15–41)
Alkaline Phosphatase: 80 U/L (ref 38–126)
Anion gap: 8 (ref 5–15)
BUN: 12 mg/dL (ref 6–20)
CALCIUM: 8.9 mg/dL (ref 8.9–10.3)
CO2: 22 mmol/L (ref 22–32)
CREATININE: 0.68 mg/dL (ref 0.44–1.00)
Chloride: 106 mmol/L (ref 101–111)
GFR calc Af Amer: >60 mL/min (ref >60)
GFR calc non Af Amer: >60 mL/min (ref >60)
Glucose, Bld: 107 mg/dL — ABNORMAL HIGH (ref 65–99)
Potassium: 3.5 mmol/L (ref 3.5–5.1)
SODIUM: 136 mmol/L (ref 135–145)
TOTAL PROTEIN: 7.7 g/dL (ref 6.5–8.1)
Total Bilirubin: 0.6 mg/dL (ref 0.3–1.2)

## 2015-07-21 LAB — RAPID URINE DRUG SCREEN, HOSP PERFORMED
Amphetamines: NOT DETECTED
Barbiturates: NOT DETECTED
Benzodiazepines: NOT DETECTED
Cocaine: NOT DETECTED
OPIATES: NOT DETECTED
TETRAHYDROCANNABINOL: NOT DETECTED

## 2015-07-21 LAB — POC URINE PREG, ED: PREG TEST UR: NEGATIVE

## 2015-07-21 NOTE — ED Notes (Signed)
Patient states she had a seizure at home approximately 1 hour ago. States she has history of seizures but last seizure was 2001. Spouse states she was standing and fell back hitting her head on the door. States she was shaking all over. Patient alert and oriented to date, time, and place. Disoriented to situation.

## 2015-07-21 NOTE — ED Provider Notes (Signed)
CSN: [id number]     Arrival date & time 07/21/15  1424 History   First MD Initiated Contact with Patient 07/21/15 1526     Chief Complaint  Patient presents with  . Seizures     (Consider location/radiation/quality/duration/timing/severity/associated sxs/prior Treatment) Patient is a 24 y.o. female presenting with seizures. The history is provided by the patient (the pt had a sz in front of her husban.  she fell to the floor and had convulsions for  afew minutes.  pt also was post ictal.  she only got about 4 hours of sleep yesterday).  Seizures Seizure activity on arrival: no   Seizure type:  Grand mal Preceding symptoms: no sensation of an aura present   Initial focality:  None Episode characteristics: no combativeness   Postictal symptoms: confusion     Past Medical History  Diagnosis Date  . ADHD (attention deficit hyperactivity disorder)     not on meds  . Seizures     as a child, last sz 12 yrs ago  . Pregnancy    Past Surgical History  Procedure Laterality Date  . No past surgeries     Family History  Problem Relation Age of Onset  . Arthritis Father   . Cancer Maternal Grandmother   . Diabetes Maternal Grandmother   . Arthritis Mother   . Hypertension Mother    Social History  Substance Use Topics  . Smoking status: Never Smoker   . Smokeless tobacco: Never Used  . Alcohol Use: No   OB History    Gravida Para Term Preterm AB TAB SAB Ectopic Multiple Living   1 1 1  0 0 0 0 0 0 1     Review of Systems  Constitutional: Negative for appetite change and fatigue.  HENT: Negative for congestion, ear discharge and sinus pressure.   Eyes: Negative for discharge.  Respiratory: Negative for cough.   Cardiovascular: Negative for chest pain.  Gastrointestinal: Negative for abdominal pain and diarrhea.  Genitourinary: Negative for frequency and hematuria.  Musculoskeletal: Negative for back pain.  Skin: Negative for rash.  Neurological: Positive for seizures.  Negative for headaches.  Psychiatric/Behavioral: Negative for hallucinations.      Allergies  Other  Home Medications   Prior to Admission medications   Medication Sig Start Date End Date Taking? Authorizing Provider  citalopram (CELEXA) 20 MG tablet 1/2 po qhs x 6 d then one po qhs Patient not taking: Reported on 07/21/2015 04/08/15   Campbell Riches, NP  norgestimate-ethinyl estradiol (ORTHO-CYCLEN,SPRINTEC,PREVIFEM) 0.25-35 MG-MCG tablet Take 1 tablet by mouth daily. Patient not taking: Reported on 07/21/2015 04/08/15   Campbell Riches, NP   BP 129/84 mmHg  Pulse 92  Temp(Src) 99.3 F (37.4 C) (Oral)  Resp 21  Ht 6' (1.829 m)  Wt 230 lb 3.2 oz (104.418 kg)  BMI 31.21 kg/m2  SpO2 97%  LMP 07/14/2015  Breastfeeding? No Physical Exam  Constitutional: She is oriented to person, place, and time. She appears well-developed.  HENT:  Head: Normocephalic.  Eyes: Conjunctivae and EOM are normal. No scleral icterus.  Neck: Neck supple. No thyromegaly present.  Cardiovascular: Normal rate and regular rhythm.  Exam reveals no gallop and no friction rub.   No murmur heard. Pulmonary/Chest: No stridor. She has no wheezes. She has no rales. She exhibits no tenderness.  Abdominal: She exhibits no distension. There is no tenderness. There is no rebound.  Musculoskeletal: Normal range of motion. She exhibits no edema.  Lymphadenopathy:  She has no cervical adenopathy.  Neurological: She is oriented to person, place, and time. She exhibits normal muscle tone. Coordination normal.  Skin: No rash noted. No erythema.  Psychiatric: She has a normal mood and affect. Her behavior is normal.    ED Course  Procedures (including critical care time) Labs Review Labs Reviewed  CBC WITH DIFFERENTIAL/PLATELET - Abnormal; Notable for the following:    WBC 13.4 (*)    Neutrophils Relative % 87 (*)    Neutro Abs 11.5 (*)    Lymphocytes Relative 9 (*)    All other components within normal  limits  COMPREHENSIVE METABOLIC PANEL - Abnormal; Notable for the following:    Glucose, Bld 107 (*)    All other components within normal limits  URINE RAPID DRUG SCREEN, HOSP PERFORMED  POC URINE PREG, ED    Imaging Review Ct Head Wo Contrast  07/21/2015   CLINICAL DATA:  Seizures today with resulting fall and posterior head injury. History of seizures, most recently in 2001. Headache. Initial encounter.  EXAM: CT HEAD WITHOUT CONTRAST  TECHNIQUE: Contiguous axial images were obtained from the base of the skull through the vertex without intravenous contrast.  COMPARISON:  None.  FINDINGS: There is no evidence of acute intracranial hemorrhage, mass lesion, brain edema or extra-axial fluid collection. The ventricles and subarachnoid spaces are appropriately sized for age. Cavum septum pellucidum and vergae noted incidentally. No evidence of acute stroke.  The visualized paranasal sinuses, mastoid air cells and middle ears are clear. The calvarium is intact.  IMPRESSION: No acute posttraumatic findings or explanation for seizures demonstrated.   Electronically Signed   By: Carey Bullocks M.D.   On: 07/21/2015 16:28     EKG Interpretation   Date/Time:  Wednesday July 21 2015 15:47:12 EDT Ventricular Rate:  95 PR Interval:  161 QRS Duration: 90 QT Interval:  336 QTC Calculation: 422 R Axis:   69 Text Interpretation:  Sinus rhythm No old tracing to compare Confirmed by  CAMPOS  MD, Caryn Bee ([id number]) on 07/21/2015 3:57:17 PM      MDM   Final diagnoses:  Seizure    Seizure,  Nl labs and ct,  Pt last had a sz 15 years ago.  Pt given sz precautions and referred to neurology for further work up    Bethann Berkshire, MD 07/21/15 1825

## 2015-07-21 NOTE — Discharge Instructions (Signed)
No driving.  Follow up with dr. Gerilyn Pilgrim in 1 week.  Return if problems

## 2015-09-30 ENCOUNTER — Ambulatory Visit (INDEPENDENT_AMBULATORY_CARE_PROVIDER_SITE_OTHER): Payer: 59 | Admitting: Nurse Practitioner

## 2015-09-30 ENCOUNTER — Other Ambulatory Visit (HOSPITAL_COMMUNITY)
Admission: RE | Admit: 2015-09-30 | Discharge: 2015-09-30 | Disposition: A | Discharge status: Home / Self Care | Payer: 59 | Source: Ambulatory Visit | Attending: Nurse Practitioner | Admitting: Nurse Practitioner

## 2015-09-30 ENCOUNTER — Encounter: Payer: Self-pay | Admitting: Nurse Practitioner

## 2015-09-30 VITALS — BP 130/80 | Ht 71.0 in | Wt 241.4 lb

## 2015-09-30 DIAGNOSIS — Z3201 Encounter for pregnancy test, result positive: Secondary | ICD-10-CM

## 2015-09-30 LAB — POCT URINE PREGNANCY: PREG TEST UR: POSITIVE — AB

## 2015-09-30 LAB — HCG, QUANTITATIVE, PREGNANCY: hCG, Beta Chain, Quant, S: 7037 m[IU]/mL — ABNORMAL HIGH (ref <5)

## 2015-10-04 ENCOUNTER — Encounter: Payer: Self-pay | Admitting: Nurse Practitioner

## 2015-10-04 NOTE — Progress Notes (Signed)
Subjective:  Presents to discuss 2 positive pregnancy test that she performed at home. Has been trying to conceive. Very irregular menses. Last normal menstrual cycle was in August. Off-and-on nausea, rare vomiting. Otherwise states she feels well. No pelvic pain or bleeding. Married, same sexual partner.  Objective:   BP 130/80 mmHg  Ht 5\' 11"  (1.803 m)  Wt 241 lb 6 oz (109.487 kg)  BMI 33.68 kg/m2 NAD. Alert, oriented. Lungs clear. Heart regular rate rhythm. Positive urine pregnancy test.  Assessment: Positive pregnancy test - Plan: POCT urine pregnancy, hCG, quantitative, pregnancy  Plan: Stat quantitative hCG pending. Given note for Medicaid and gynecology. Seek help immediately if any problems. Patient plans to make an appointment to begin prenatal care.

## 2015-10-14 ENCOUNTER — Telehealth: Payer: Self-pay | Admitting: Family Medicine

## 2015-10-14 NOTE — Telephone Encounter (Signed)
error 

## 2015-10-28 ENCOUNTER — Encounter (HOSPITAL_COMMUNITY): Payer: Self-pay | Admitting: *Deleted

## 2015-10-28 ENCOUNTER — Inpatient Hospital Stay (HOSPITAL_COMMUNITY)
Admission: AD | Admit: 2015-10-28 | Discharge: 2015-10-28 | Disposition: A | Discharge status: Home / Self Care | Payer: Medicaid Other | Source: Ambulatory Visit | Attending: Obstetrics and Gynecology | Admitting: Obstetrics and Gynecology

## 2015-10-28 ENCOUNTER — Inpatient Hospital Stay (HOSPITAL_COMMUNITY): Payer: Medicaid Other

## 2015-10-28 DIAGNOSIS — O209 Hemorrhage in early pregnancy, unspecified: Secondary | ICD-10-CM

## 2015-10-28 DIAGNOSIS — O26891 Other specified pregnancy related conditions, first trimester: Secondary | ICD-10-CM | POA: Diagnosis not present

## 2015-10-28 DIAGNOSIS — O26851 Spotting complicating pregnancy, first trimester: Secondary | ICD-10-CM | POA: Diagnosis not present

## 2015-10-28 DIAGNOSIS — Z6711 Type A blood, Rh negative: Secondary | ICD-10-CM | POA: Insufficient documentation

## 2015-10-28 DIAGNOSIS — Z3A01 Less than 8 weeks gestation of pregnancy: Secondary | ICD-10-CM

## 2015-10-28 DIAGNOSIS — O360910 Maternal care for other rhesus isoimmunization, first trimester, not applicable or unspecified: Secondary | ICD-10-CM | POA: Diagnosis not present

## 2015-10-28 DIAGNOSIS — N912 Amenorrhea, unspecified: Secondary | ICD-10-CM

## 2015-10-28 LAB — URINALYSIS, ROUTINE W REFLEX MICROSCOPIC
Bilirubin Urine: NEGATIVE
GLUCOSE, UA: NEGATIVE mg/dL
KETONES UR: NEGATIVE mg/dL
Nitrite: NEGATIVE
PH: 6 (ref 5.0–8.0)
PROTEIN: NEGATIVE mg/dL
Specific Gravity, Urine: 1.015 (ref 1.005–1.030)

## 2015-10-28 LAB — CBC
HEMATOCRIT: 39 % (ref 36.0–46.0)
HEMOGLOBIN: 13.6 g/dL (ref 12.0–15.0)
MCH: 30.2 pg (ref 26.0–34.0)
MCHC: 34.9 g/dL (ref 30.0–36.0)
MCV: 86.7 fL (ref 78.0–100.0)
Platelets: 332 10*3/uL (ref 150–400)
RBC: 4.5 MIL/uL (ref 3.87–5.11)
RDW: 12.8 % (ref 11.5–15.5)
WBC: 9.1 10*3/uL (ref 4.0–10.5)

## 2015-10-28 LAB — URINE MICROSCOPIC-ADD ON
Bacteria, UA: NONE SEEN
RBC / HPF: NONE SEEN RBC/hpf (ref 0–5)

## 2015-10-28 LAB — WET PREP, GENITAL
SPERM: NONE SEEN
Trich, Wet Prep: NONE SEEN
YEAST WET PREP: NONE SEEN

## 2015-10-28 LAB — HCG, QUANTITATIVE, PREGNANCY: HCG, BETA CHAIN, QUANT, S: 13259 m[IU]/mL — AB (ref <5)

## 2015-10-28 MED ORDER — RHO D IMMUNE GLOBULIN 1500 UNIT/2ML IJ SOSY
300.0000 ug | PREFILLED_SYRINGE | Freq: Once | INTRAMUSCULAR | Status: AC
  Administered 2015-10-28: 300 ug via INTRAMUSCULAR
  Filled 2015-10-28: qty 2

## 2015-10-28 NOTE — [facility] Note (Signed)
Pt presents to [facility] with complaints of vaginal spotting that started around 3 today. Denies any pain. Last intercourse 2 days ago

## 2015-10-28 NOTE — Discharge Instructions (Signed)
First Trimester of Pregnancy The first trimester of pregnancy is from week 1 until the end of week 12 (months 1 through 3). A week after a sperm fertilizes an egg, the egg will implant on the wall of the uterus. This embryo will begin to develop into a baby. Genes from you and your partner are forming the baby. The female genes determine whether the baby is a boy or a girl. At 6-8 weeks, the eyes and face are formed, and the heartbeat can be seen on ultrasound. At the end of 12 weeks, all the baby's organs are formed.  Now that you are pregnant, you will want to do everything you can to have a healthy baby. Two of the most important things are to get good prenatal care and to follow your health care provider's instructions. Prenatal care is all the medical care you receive before the baby's birth. This care will help prevent, find, and treat any problems during the pregnancy and childbirth. BODY CHANGES Your body goes through many changes during pregnancy. The changes vary from woman to woman.   You may gain or lose a couple of pounds at first.  You may feel sick to your stomach (nauseous) and throw up (vomit). If the vomiting is uncontrollable, call your health care provider.  You may tire easily.  You may develop headaches that can be relieved by medicines approved by your health care provider.  You may urinate more often. Painful urination may mean you have a bladder infection.  You may develop heartburn as a result of your pregnancy.  You may develop constipation because certain hormones are causing the muscles that push waste through your intestines to slow down.  You may develop hemorrhoids or swollen, bulging veins (varicose veins).  Your breasts may begin to grow larger and become tender. Your nipples may stick out more, and the tissue that surrounds them (areola) may become darker.  Your gums may bleed and may be sensitive to brushing and flossing.  Dark spots or blotches  (chloasma, mask of pregnancy) may develop on your face. This will likely fade after the baby is born.  Your menstrual periods will stop.  You may have a loss of appetite.  You may develop cravings for certain kinds of food.  You may have changes in your emotions from day to day, such as being excited to be pregnant or being concerned that something may go wrong with the pregnancy and baby.  You may have more vivid and strange dreams.  You may have changes in your hair. These can include thickening of your hair, rapid growth, and changes in texture. Some women also have hair loss during or after pregnancy, or hair that feels dry or thin. Your hair will most likely return to normal after your baby is born. WHAT TO EXPECT AT YOUR PRENATAL VISITS During a routine prenatal visit:  You will be weighed to make sure you and the baby are growing normally.  Your blood pressure will be taken.  Your abdomen will be measured to track your baby's growth.  The fetal heartbeat will be listened to starting around week 10 or 12 of your pregnancy.  Test results from any previous visits will be discussed. Your health care provider may ask you:  How you are feeling.  If you are feeling the baby move.  If you have had any abnormal symptoms, such as leaking fluid, bleeding, severe headaches, or abdominal cramping.  If you are using any tobacco products,   including cigarettes, chewing tobacco, and electronic cigarettes.  If you have any questions. Other tests that may be performed during your first trimester include:  Blood tests to find your blood type and to check for the presence of any previous infections. They will also be used to check for low iron levels (anemia) and Rh antibodies. Later in the pregnancy, blood tests for diabetes will be done along with other tests if problems develop.  Urine tests to check for infections, diabetes, or protein in the urine.  An ultrasound to confirm the  proper growth and development of the baby.  An amniocentesis to check for possible genetic problems.  Fetal screens for spina bifida and Down syndrome.  You may need other tests to make sure you and the baby are doing well.  HIV (human immunodeficiency virus) testing. Routine prenatal testing includes screening for HIV, unless you choose not to have this test. HOME CARE INSTRUCTIONS  Medicines  Follow your health care provider's instructions regarding medicine use. Specific medicines may be either safe or unsafe to take during pregnancy.  Take your prenatal vitamins as directed.  If you develop constipation, try taking a stool softener if your health care provider approves. Diet  Eat regular, well-balanced meals. Choose a variety of foods, such as meat or vegetable-based protein, fish, milk and low-fat dairy products, vegetables, fruits, and whole grain breads and cereals. Your health care provider will help you determine the amount of weight gain that is right for you.  Avoid raw meat and uncooked cheese. These carry germs that can cause birth defects in the baby.  Eating four or five small meals rather than three large meals a day may help relieve nausea and vomiting. If you start to feel nauseous, eating a few soda crackers can be helpful. Drinking liquids between meals instead of during meals also seems to help nausea and vomiting.  If you develop constipation, eat more high-fiber foods, such as fresh vegetables or fruit and whole grains. Drink enough fluids to keep your urine clear or pale yellow. Activity and Exercise  Exercise only as directed by your health care provider. Exercising will help you:  Control your weight.  Stay in shape.  Be prepared for labor and delivery.  Experiencing pain or cramping in the lower abdomen or low back is a good sign that you should stop exercising. Check with your health care provider before continuing normal exercises.  Try to avoid  standing for long periods of time. Move your legs often if you must stand in one place for a long time.  Avoid heavy lifting.  Wear low-heeled shoes, and practice good posture.  You may continue to have sex unless your health care provider directs you otherwise. Relief of Pain or Discomfort  Wear a good support bra for breast tenderness.   Take warm sitz baths to soothe any pain or discomfort caused by hemorrhoids. Use hemorrhoid cream if your health care provider approves.   Rest with your legs elevated if you have leg cramps or low back pain.  If you develop varicose veins in your legs, wear support hose. Elevate your feet for 15 minutes, 3-4 times a day. Limit salt in your diet. Prenatal Care  Schedule your prenatal visits by the twelfth week of pregnancy. They are usually scheduled monthly at first, then more often in the last 2 months before delivery.  Write down your questions. Take them to your prenatal visits.  Keep all your prenatal visits as directed by your   health care provider. Safety  Wear your seat belt at all times when driving.  Make a list of emergency phone numbers, including numbers for family, friends, the hospital, and police and fire departments. General Tips  Ask your health care provider for a referral to a local prenatal education class. Begin classes no later than at the beginning of month 6 of your pregnancy.  Ask for help if you have counseling or nutritional needs during pregnancy. Your health care provider can offer advice or refer you to specialists for help with various needs.  Do not use hot tubs, steam rooms, or saunas.  Do not douche or use tampons or scented sanitary pads.  Do not cross your legs for long periods of time.  Avoid cat litter boxes and soil used by cats. These carry germs that can cause birth defects in the baby and possibly loss of the fetus by miscarriage or stillbirth.  Avoid all smoking, herbs, alcohol, and medicines  not prescribed by your health care provider. Chemicals in these affect the formation and growth of the baby.  Do not use any tobacco products, including cigarettes, chewing tobacco, and electronic cigarettes. If you need help quitting, ask your health care provider. You may receive counseling support and other resources to help you quit.  Schedule a dentist appointment. At home, brush your teeth with a soft toothbrush and be gentle when you floss. SEEK MEDICAL CARE IF:   You have dizziness.  You have mild pelvic cramps, pelvic pressure, or nagging pain in the abdominal area.  You have persistent nausea, vomiting, or diarrhea.  You have a bad smelling vaginal discharge.  You have pain with urination.  You notice increased swelling in your face, hands, legs, or ankles. SEEK IMMEDIATE MEDICAL CARE IF:   You have a fever.  You are leaking fluid from your vagina.  You have spotting or bleeding from your vagina.  You have severe abdominal cramping or pain.  You have rapid weight gain or loss.  You vomit blood or material that looks like coffee grounds.  You are exposed to German measles and have never had them.  You are exposed to fifth disease or chickenpox.  You develop a severe headache.  You have shortness of breath.  You have any kind of trauma, such as from a fall or a car accident.   This information is not intended to replace advice given to you by your health care provider. Make sure you discuss any questions you have with your health care provider.   Document Released: 11/21/2001 Document Revised: 12/18/2014 Document Reviewed: 10/07/2013 [employer] Interactive Patient Education 2016 [employer]  Safe Medications in Pregnancy   Acne: Benzoyl Peroxide Salicylic Acid  Backache/Headache: Tylenol: 2 regular strength every 4 hours OR              2 Extra strength every 6 hours  Colds/Coughs/Allergies: Benadryl (alcohol free) 25 mg every 6 hours as  needed Breath right strips Claritin Cepacol throat lozenges Chloraseptic throat spray Cold-Eeze- up to three times per day Cough drops, alcohol free Flonase (by prescription only) Guaifenesin Mucinex Robitussin DM (plain only, alcohol free) Saline nasal spray/drops Sudafed (pseudoephedrine) & Actifed ** use only after [redacted] weeks gestation and if you do not have high blood pressure Tylenol Vicks Vaporub Zinc lozenges Zyrtec   Constipation: Colace Ducolax suppositories Fleet enema Glycerin suppositories Metamucil Milk of magnesia Miralax Senokot Smooth move tea  Diarrhea: Kaopectate Imodium A-D  *NO pepto Bismol  Hemorrhoids: Anusol Anusol   HC Preparation H Tucks  Indigestion: Tums Maalox Mylanta Zantac  Pepcid  Insomnia: Benadryl (alcohol free) 25mg every 6 hours as needed Tylenol PM Unisom, no Gelcaps  Leg Cramps: Tums MagGel  Nausea/Vomiting:  Bonine Dramamine Emetrol Ginger extract Sea bands Meclizine  Nausea medication to take during pregnancy:  Unisom (doxylamine succinate 25 mg tablets) Take one tablet daily at bedtime. If symptoms are not adequately controlled, the dose can be increased to a maximum recommended dose of two tablets daily (1/2 tablet in the morning, 1/2 tablet mid-afternoon and one at bedtime). Vitamin B6 100mg tablets. Take one tablet twice a day (up to 200 mg per day).  Skin Rashes: Aveeno products Benadryl cream or 25mg every 6 hours as needed Calamine Lotion 1% cortisone cream  Yeast infection: Gyne-lotrimin 7 Monistat 7   **If taking multiple medications, please check labels to avoid duplicating the same active ingredients **take medication as directed on the label ** Do not exceed 4000 mg of tylenol in 24 hours **Do not take medications that contain aspirin or ibuprofen    

## 2015-10-28 NOTE — MAU Provider Note (Signed)
History     CSN: [id number]  Arrival date and time: 10/28/15 1717   First Provider Initiated Contact with Patient 10/28/15 1803      Chief Complaint  Patient presents with  . Vaginal Bleeding   HPI   Ms.Barbara Robertson is a 24 y.o. female G2P1001 at [redacted]w[redacted]d presenting to MAU with vaginal bleeding. The  "Spotting" started today. The bleeding is light, she is not having to wear a pad. The bleeding is described as bright red. Her periods have been irregular and she is unsure of her actual LMP.   She is A negative blood type and knows she needs rhogam.  Denies history of hypertension.    OB History    Gravida Para Term Preterm AB TAB SAB Ectopic Multiple Living   0 0 0 0 0 0 1      Past Medical History  Diagnosis Date  . ADHD (attention deficit hyperactivity disorder)     not on meds  . Seizures (HCC)     as a child, last sz 12 yrs ago  . Pregnancy     Past Surgical History  Procedure Laterality Date  . No past surgeries      Family History  Problem Relation Age of Onset  . Arthritis Father   . Cancer Maternal Grandmother   . Diabetes Maternal Grandmother   . Arthritis Mother   . Hypertension Mother     Social History  Substance Use Topics  . Smoking status: Never Smoker   . Smokeless tobacco: Never Used  . Alcohol Use: No    Allergies:  Allergies  Allergen Reactions  . Other Other (See Comments)    Grapes- seizures    Prescriptions prior to admission  Medication Sig Dispense Refill Last Dose  . ibuprofen (ADVIL,MOTRIN) 200 MG tablet Take 400 mg by mouth every 6 (six) hours as needed for headache.   Past Week at Unknown time  . Prenatal Vit-Fe Fumarate-FA (PRENATAL MULTIVITAMIN) TABS tablet Take 1 tablet by mouth daily at 12 noon.   10/28/2015 at Unknown time   Results for orders placed or performed during the hospital encounter of 10/28/15 (from the past 48 hour(s))  Urinalysis, Routine w reflex microscopic (not at [facility])     Status: Abnormal    Collection Time: 10/28/15  5:20 PM  Result Value Ref Range   Color, Urine YELLOW YELLOW   APPearance CLEAR CLEAR   Specific Gravity, Urine 1.015 1.005 - 1.030   pH 6.0 5.0 - 8.0   Glucose, UA NEGATIVE NEGATIVE mg/dL   Hgb urine dipstick LARGE (A) NEGATIVE   Bilirubin Urine NEGATIVE NEGATIVE   Ketones, ur NEGATIVE NEGATIVE mg/dL   Protein, ur NEGATIVE NEGATIVE mg/dL   Nitrite NEGATIVE NEGATIVE   Leukocytes, UA SMALL (A) NEGATIVE  Urine microscopic-add on     Status: Abnormal   Collection Time: 10/28/15  5:20 PM  Result Value Ref Range   Squamous Epithelial / LPF 0-5 (A) NONE SEEN    Comment: Please note change in reference range.   WBC, UA 0-5 0 - 5 WBC/hpf    Comment: Please note change in reference range.   RBC / HPF NONE SEEN 0 - 5 RBC/hpf    Comment: Please note change in reference range.   Bacteria, UA NONE SEEN NONE SEEN    Comment: Please note change in reference range.  Wet prep, genital     Status: Abnormal   Collection Time: 10/28/15  6:15 PM  Result Value Ref Range   Yeast Wet Prep HPF POC NONE SEEN NONE SEEN   Trich, Wet Prep NONE SEEN NONE SEEN   Clue Cells Wet Prep HPF POC PRESENT (A) NONE SEEN   WBC, Wet Prep HPF POC MODERATE (A) NONE SEEN    Comment: MODERATE BACTERIA SEEN   Sperm NONE SEEN   CBC     Status: None   Collection Time: 10/28/15  6:32 PM  Result Value Ref Range   WBC 9.1 4.0 - 10.5 K/uL   RBC 4.50 3.87 - 5.11 MIL/uL   Hemoglobin 13.6 12.0 - 15.0 g/dL   HCT 16.1 09.6 - 04.5 %   MCV 86.7 78.0 - 100.0 fL   MCH 30.2 26.0 - 34.0 pg   MCHC 34.9 30.0 - 36.0 g/dL   RDW 40.9 81.1 - 91.4 %   Platelets 332 150 - 400 K/uL  hCG, quantitative, pregnancy     Status: Abnormal   Collection Time: 10/28/15  6:32 PM  Result Value Ref Range   hCG, Beta Chain, Quant, S 13259 (H) <5 mIU/mL    Comment:          GEST. AGE      CONC.  (mIU/mL)   <=1 WEEK        5 - 50     2 WEEKS       50 - 500     3 WEEKS       100 - 10,000     4 WEEKS     1,000 - 30,000     5  WEEKS     3,500 - 115,000   6-8 WEEKS     12,000 - 270,000    12 WEEKS     15,000 - 220,000        FEMALE AND NON-PREGNANT FEMALE:     LESS THAN 5 mIU/mL   Rh IG workup (includes ABO/Rh)     Status: None   Collection Time: 10/28/15  6:32 PM  Result Value Ref Range   Gestational Age(Wks) 11    ABO/RH(D) A NEG    Antibody Screen NEG     Review of Systems  Constitutional: Negative for fever and chills.  Gastrointestinal: Positive for nausea. Negative for vomiting and abdominal pain.  Genitourinary: Negative for dysuria.   Physical Exam   Blood pressure 140/80, pulse 106, temperature 98.7 F (37.1 C), resp. rate 18, last menstrual period 08/11/2015, not currently breastfeeding.  Physical Exam  Constitutional: She is oriented to person, place, and time. She appears well-developed and well-nourished. No distress.  HENT:  Head: Normocephalic.  Eyes: Pupils are equal, round, and reactive to light.  Neck: Neck supple.  Respiratory: Effort normal.  GI: Soft. She exhibits no distension. There is no tenderness.  Genitourinary:  Speculum exam: Vagina - Small amount of dark red blood in the vagina.  Cervix - + contact bleeding  Bimanual exam: Cervix closed Wet prep done Chaperone present for exam.  Musculoskeletal: Normal range of motion.  Neurological: She is alert and oriented to person, place, and time.  Skin: She is not diaphoretic.  Psychiatric: Her behavior is normal.   Results for orders placed or performed during the hospital encounter of 10/28/15 (from the past 24 hour(s))  Urinalysis, Routine w reflex microscopic (not at [facility])     Status: Abnormal   Collection Time: 10/28/15  5:20 PM  Result Value Ref Range   Color, Urine YELLOW YELLOW   APPearance CLEAR CLEAR  Specific Gravity, Urine 1.015 1.005 - 1.030   pH 6.0 5.0 - 8.0   Glucose, UA NEGATIVE NEGATIVE mg/dL   Hgb urine dipstick LARGE (A) NEGATIVE   Bilirubin Urine NEGATIVE NEGATIVE   Ketones, ur NEGATIVE  NEGATIVE mg/dL   Protein, ur NEGATIVE NEGATIVE mg/dL   Nitrite NEGATIVE NEGATIVE   Leukocytes, UA SMALL (A) NEGATIVE  Urine microscopic-add on     Status: Abnormal   Collection Time: 10/28/15  5:20 PM  Result Value Ref Range   Squamous Epithelial / LPF 0-5 (A) NONE SEEN   WBC, UA 0-5 0 - 5 WBC/hpf   RBC / HPF NONE SEEN 0 - 5 RBC/hpf   Bacteria, UA NONE SEEN NONE SEEN  Wet prep, genital     Status: Abnormal   Collection Time: 10/28/15  6:15 PM  Result Value Ref Range   Yeast Wet Prep HPF POC NONE SEEN NONE SEEN   Trich, Wet Prep NONE SEEN NONE SEEN   Clue Cells Wet Prep HPF POC PRESENT (A) NONE SEEN   WBC, Wet Prep HPF POC MODERATE (A) NONE SEEN   Sperm NONE SEEN   CBC     Status: None   Collection Time: 10/28/15  6:32 PM  Result Value Ref Range   WBC 9.1 4.0 - 10.5 K/uL   RBC 4.50 3.87 - 5.11 MIL/uL   Hemoglobin 13.6 12.0 - 15.0 g/dL   HCT 16.139.0 09.636.0 - 04.546.0 %   MCV 86.7 78.0 - 100.0 fL   MCH 30.2 26.0 - 34.0 pg   MCHC 34.9 30.0 - 36.0 g/dL   RDW 40.912.8 81.111.5 - 91.415.5 %   Platelets 332 150 - 400 K/uL  hCG, quantitative, pregnancy     Status: Abnormal   Collection Time: 10/28/15  6:32 PM  Result Value Ref Range   hCG, Beta Chain, Quant, S 13259 (H) <5 mIU/mL  Rh IG workup (includes ABO/Rh)     Status: None (Preliminary result)   Collection Time: 10/28/15  6:32 PM  Result Value Ref Range   Gestational Age(Wks) 11    ABO/RH(D) A NEG    Antibody Screen NEG    Unit Number [id number]    Blood Component Type RHIG    Unit division 00    Status of Unit ALLOCATED    Transfusion Status OK TO TRANSFUSE    [place] Ob Comp Less 14 Wks  10/28/2015  CLINICAL DATA:  Vaginal bleeding EXAM: OBSTETRIC <14 WK US AND TRANSVAGINAL OB US TECHNIQUE: Both transabdominal and transvaginal ultrasound examinations were performed for complete evaluation of the gestation as well as the maternal uterus, adnexal regions, and pelvic cul-de-sac. Transvaginal technique was performed to assess early pregnancy.  COMPARISON:  None. FINDINGS: Intrauterine gestational sac: Visualized/normal in shape. Yolk sac:  Present Embryo:  Present Cardiac Activity: Absent MSD: 1.9 cm  mm   6 w   6  d CRL:  5  mm   6 w   2 d Maternal uterus/adnexae: Small subchorionic hemorrhage is identified. No abnormality of the ovaries is noted. IMPRESSION: Single intrauterine gestation with absent fetal cardiac activity. Findings are suspicious but not yet definitive for failed pregnancy. Recommend follow-up US in 10-14 days for definitive diagnosis. This recommendation follows SRU consensus guidelines: Diagnostic Criteria for Nonviable Pregnancy Early in the First Trimester. Malva Limes Engl J Med 2013; 578:4696-29; 369:1443-51. Small subchorionic hemorrhage. Electronically Signed   By: Alcide CleverMark  Lukens M.D.   On: 10/28/2015 19:50   [place] Ob Transvaginal  10/28/2015  CLINICAL DATA:  Vaginal bleeding EXAM: OBSTETRIC <14 WK [place] AND TRANSVAGINAL OB US TECHNIQUE: Both transabdominal and transvaginal ultrasound examinations were performed for complete evaluation of the gestation as well as the maternal uterus, adnexal regions, and pelvic cul-de-sac. Transvaginal technique was performed to assess early pregnancy. COMPARISON:  None. FINDINGS: Intrauterine gestational sac: Visualized/normal in shape. Yolk sac:  Present Embryo:  Present Cardiac Activity: Absent MSD: 1.9 cm  mm   6 w   6  d CRL:  5  mm   6 w   2 d Maternal uterus/adnexae: Small subchorionic hemorrhage is identified. No abnormality of the ovaries is noted. IMPRESSION: Single intrauterine gestation with absent fetal cardiac activity. Findings are suspicious but not yet definitive for failed pregnancy. Recommend follow-up US in 10-14 days for definitive diagnosis. This recommendation follows SRU consensus guidelines: Diagnostic Criteria for Nonviable Pregnancy Early in the First Trimester. Malva Limes Med 2013; 161:0960-45. Small subchorionic hemorrhage. Electronically Signed   By: Alcide Clever M.D.   On: 10/28/2015 19:50      MAU Course  Procedures  None  MDM  CBC  ABO [place]  Quant   Rhogam given today in MAU.  A negative blood type   Patient returned from US> RN to recheck BP> awaiting [place] results at this time. Report given to Thressa Sheller CNM who resumes care of the patient.  Possible chronic hypertension patient should establish care in the high risk clinic for BP check and to establish care early.   Duane Lope, NP   Assessment and Plan   1. Vaginal bleeding in pregnancy, first trimester   2. Amenorrhea   3. [redacted] weeks gestation of pregnancy    DC home Comfort measures reviewed  1st Trimester precautions  Bleeding precautions RX: none  Return to MAU as needed   Follow-up Information    Follow up with [facility].   Specialty:  Obstetrics and Gynecology   Why:  they will call you with an appointment    Contact information:   [address] [place] [address] [phone]

## 2015-10-29 LAB — RH IG WORKUP (INCLUDES ABO/RH)
ABO/RH(D): A NEG
ANTIBODY SCREEN: NEGATIVE
Gestational Age(Wks): 11
Unit division: 0

## 2015-10-29 LAB — HIV ANTIBODY (ROUTINE TESTING W REFLEX): HIV Screen 4th Generation wRfx: NONREACTIVE

## 2015-11-01 ENCOUNTER — Telehealth: Payer: Self-pay

## 2015-11-01 DIAGNOSIS — Z3200 Encounter for pregnancy test, result unknown: Secondary | ICD-10-CM

## 2015-11-01 NOTE — Telephone Encounter (Signed)
Pt called and stated that she is about 6-[redacted] wks pregnant and she is having some bleeding.  Wants to know when she could come in?

## 2015-11-01 NOTE — Telephone Encounter (Signed)
Per last US on 10/28/15 showed no cardiac activity and recommended a f/u US in 10-14 days.  US scheduled for 11/08/15 @ 1330.  Pt informed me that she is having reddish-pink bleeding.  She wears a thin pad and will change 3-4 hours and bleeding will be a little bigger than a silver dollar.  Pt also states that she sometimes does not have bleeding and that at times she may have bloody show when she wipes.  I explained to the pt that it could related to her having a subchrionic hemorrhage and that overtime the bleeding goes away.  I advised her that if her bleeding picks up to where she has to change her pad in an hour to please to go to MAU.  I also explained to the pt that it was recommended to have another US 10-14 days from her US on 10/28/15 due to US showing no cardiac activity.  Pt expressed concerned, I advised pt that that was the purpose of f/u US to make sure baby is okay.  Pt notified of US appt and had no further questions.

## 2015-11-08 ENCOUNTER — Ambulatory Visit (HOSPITAL_COMMUNITY): Admission: RE | Admit: 2015-11-08 | Payer: 59 | Source: Ambulatory Visit

## 2015-11-15 ENCOUNTER — Telehealth: Payer: Self-pay | Admitting: General Practice

## 2015-11-15 NOTE — Telephone Encounter (Signed)
Patient no showed to u/s appt on 11/28. Previous u/s showed no cardiac activity. Patient needs follow up. Called patient at both numbers & left messages to call us back in regards to an appt.

## 2015-11-16 ENCOUNTER — Encounter: Payer: Self-pay | Admitting: General Practice

## 2015-11-16 NOTE — Telephone Encounter (Signed)
Called patient at both numbers no answer- left messages on both to call us back in regards to a missed appt. Will send letter

## 2015-12-12 NOTE — L&D Delivery Note (Signed)
Delivery Note At 2:48 PM a viable female was delivered via Vaginal, Spontaneous Delivery (Presentation: OA to ROA ;  ).  APGAR: 9, 9; weight pending .   Placenta status: delivered intact with gentle traction; 3 VC.     Anesthesia:  none Episiotomy: None Lacerations: 1st degree;Periurethral; hemostatic Est. Blood Loss (mL): 200  Mom to AICU.  Baby to Couplet care / Skin to Skin.  Charlesetta GaribaldiKathryn Lorraine Kooistra CNM 09/26/2016, 3:14 PM

## 2016-02-23 ENCOUNTER — Ambulatory Visit: Payer: Medicaid Other

## 2016-02-24 ENCOUNTER — Encounter: Payer: Self-pay | Admitting: Obstetrics & Gynecology

## 2016-02-24 ENCOUNTER — Ambulatory Visit (INDEPENDENT_AMBULATORY_CARE_PROVIDER_SITE_OTHER): Payer: BLUE CROSS/BLUE SHIELD

## 2016-02-24 DIAGNOSIS — Z3201 Encounter for pregnancy test, result positive: Secondary | ICD-10-CM

## 2016-02-24 DIAGNOSIS — Z113 Encounter for screening for infections with a predominantly sexual mode of transmission: Secondary | ICD-10-CM

## 2016-02-24 DIAGNOSIS — O3680X0 Pregnancy with inconclusive fetal viability, not applicable or unspecified: Secondary | ICD-10-CM

## 2016-02-24 LAB — POCT PREGNANCY, URINE: Preg Test, Ur: POSITIVE — AB

## 2016-02-24 NOTE — Addendum Note (Signed)
Addended by: Faythe CasaBELLAMY, Sri Clegg M on: 02/24/2016 02:33 PM   Modules accepted: Orders

## 2016-02-24 NOTE — Progress Notes (Signed)
Pt reports LMP approximately 01/04/16.  Scheduled US for dating/viability for 03/02/16 @ 1500.  Pt notified.  Pt to start prenatal care at the Clinics.

## 2016-02-25 LAB — GC/CHLAMYDIA PROBE AMP (~~LOC~~) NOT AT ARMC
CHLAMYDIA, DNA PROBE: NEGATIVE
Neisseria Gonorrhea: NEGATIVE

## 2016-02-25 LAB — CULTURE, OB URINE
Colony Count: NO GROWTH
Organism ID, Bacteria: NO GROWTH

## 2016-02-26 LAB — PRESCRIPTION MONITORING PROFILE (19 PANEL)
Amphetamine/Meth: NEGATIVE ng/mL
BARBITURATE SCREEN, URINE: NEGATIVE ng/mL
BENZODIAZEPINE SCREEN, URINE: NEGATIVE ng/mL
BUPRENORPHINE, URINE: NEGATIVE ng/mL
CANNABINOID SCRN UR: NEGATIVE ng/mL
COCAINE METABOLITES: NEGATIVE ng/mL
CREATININE, URINE: 227.08 mg/dL (ref >20.0)
Carisoprodol, Urine: NEGATIVE ng/mL
Fentanyl, Ur: NEGATIVE ng/mL
MDMA URINE: NEGATIVE ng/mL
Meperidine, Ur: NEGATIVE ng/mL
Methadone Screen, Urine: NEGATIVE ng/mL
Methaqualone: NEGATIVE ng/mL
Nitrites, Initial: NEGATIVE ug/mL
OPIATE SCREEN, URINE: NEGATIVE ng/mL
OXYCODONE SCRN UR: NEGATIVE ng/mL
PH URINE, INITIAL: 5.8 pH (ref 4.5–8.9)
PHENCYCLIDINE, UR: NEGATIVE ng/mL
Propoxyphene: NEGATIVE ng/mL
Tapentadol, urine: NEGATIVE ng/mL
Tramadol Scrn, Ur: NEGATIVE ng/mL
ZOLPIDEM, URINE: NEGATIVE ng/mL

## 2016-02-28 IMAGING — US US OB TRANSVAGINAL
1 series · 15 of 28 positions shown · non-contrast
Comparison: None.

CLINICAL DATA: Vaginal bleeding

EXAM:
OBSTETRIC <14 WK US AND TRANSVAGINAL OB US
TECHNIQUE: Both transabdominal and transvaginal ultrasound examinations were
performed for complete evaluation of the gestation as well as the
maternal uterus, adnexal regions, and pelvic cul-de-sac.
Transvaginal technique was performed to assess early pregnancy.

[Series 1: us ob transvaginal · 15 of 70 slices shown]
[im 1/70]
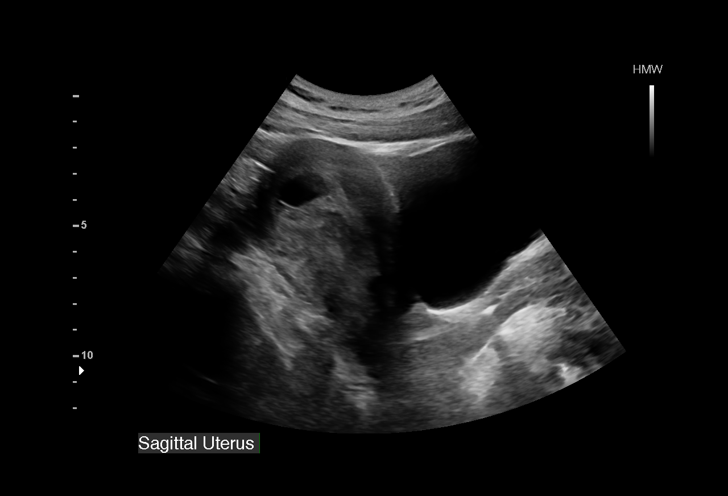
[im 6/70]
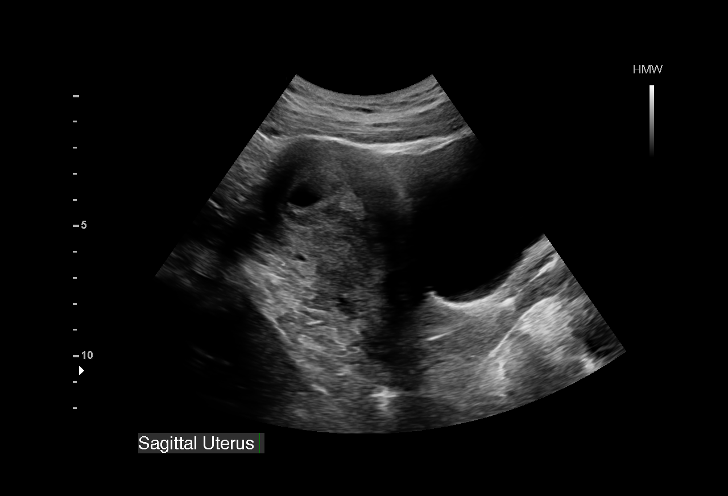
[im 11/70]
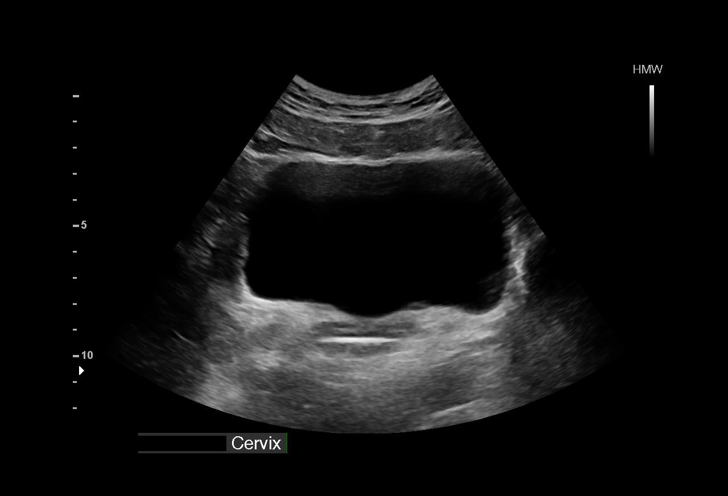
[im 16/70]
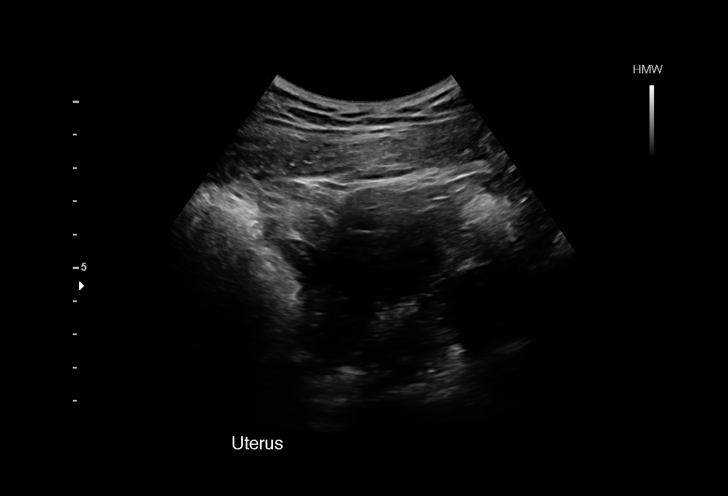
[im 21/70]
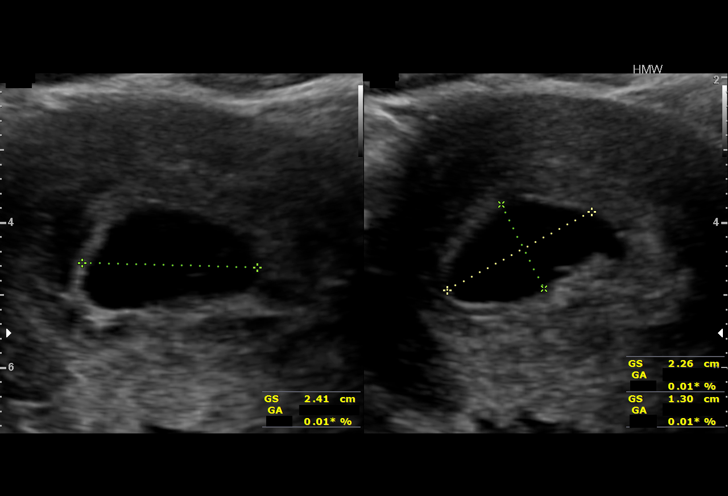
[im 26/70]
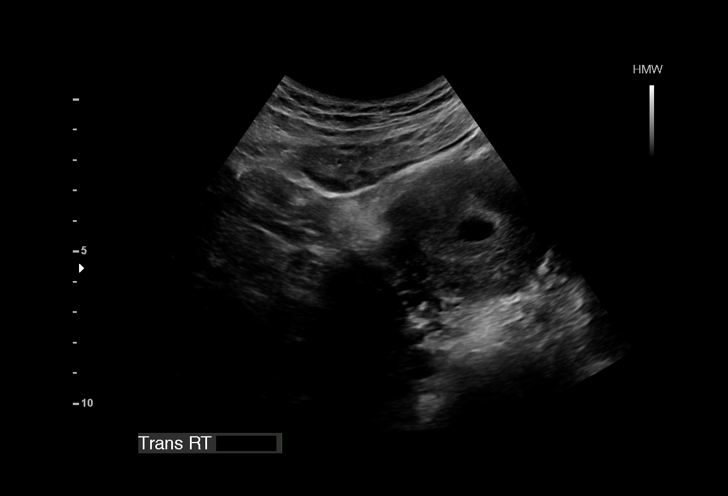
[im 31/70]
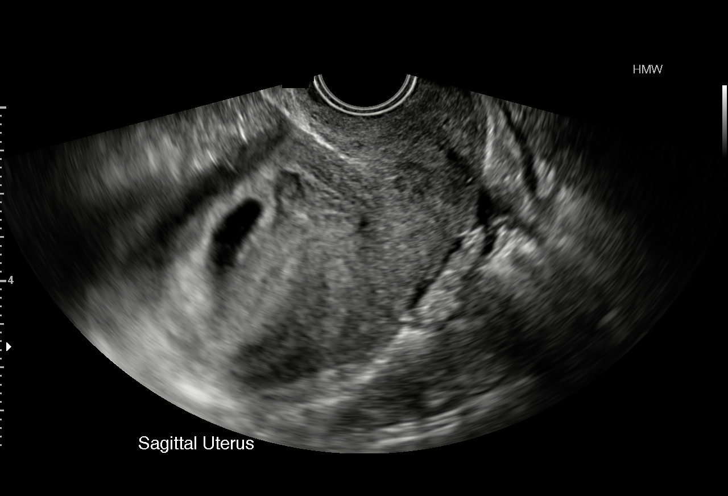
[im 36/70]
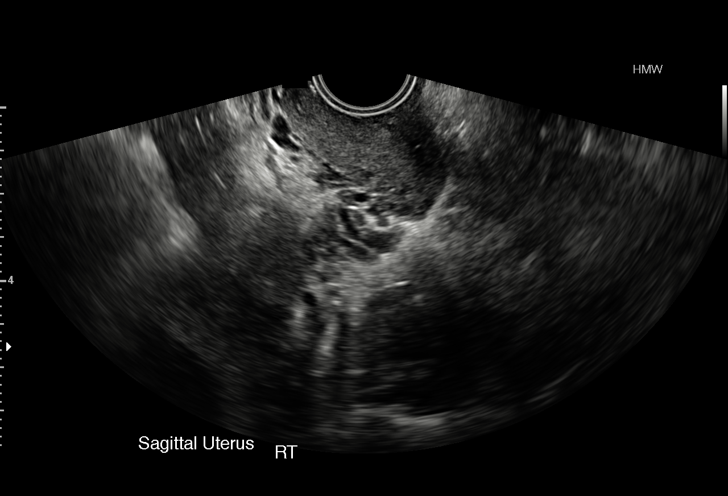
[im 39/70]
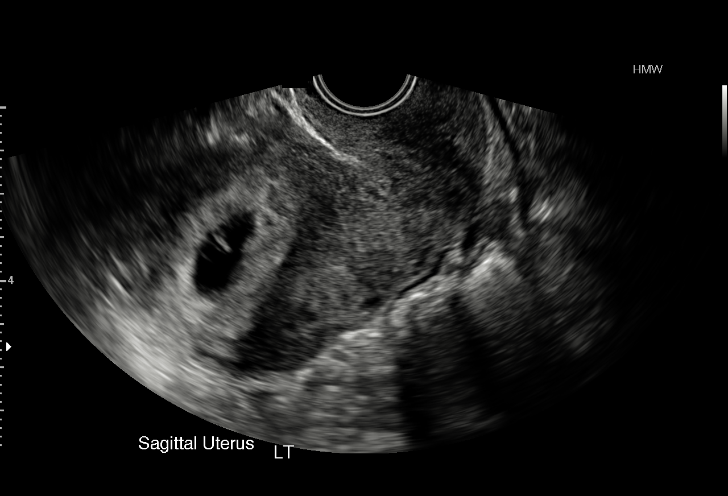
[im 44/70]
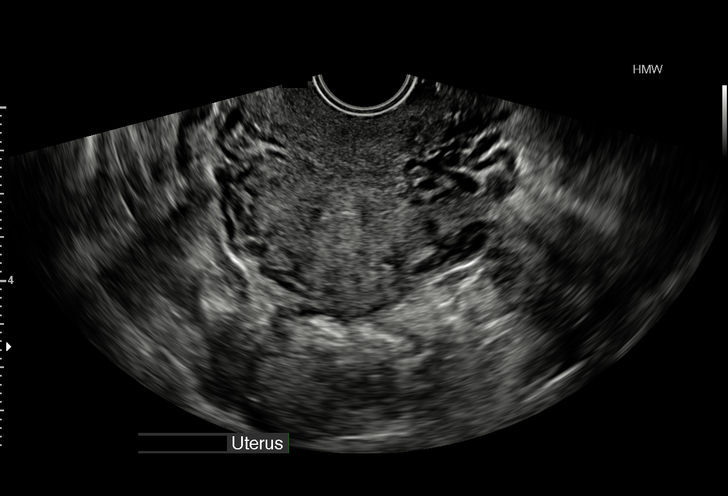
[im 49/70]
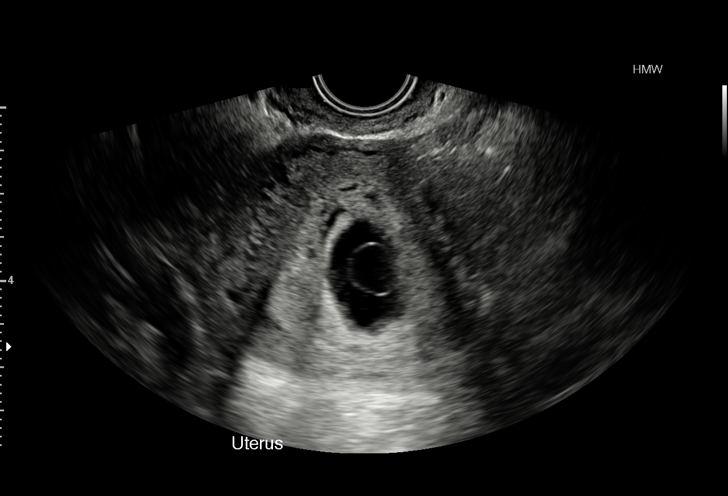
[im 54/70]
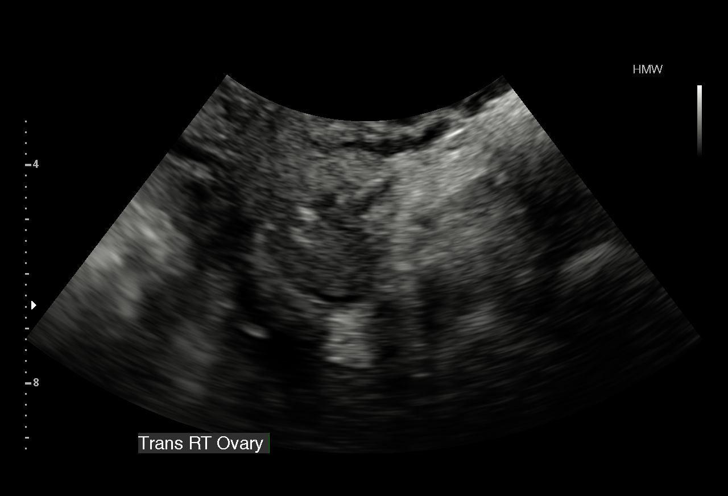
[im 59/70]
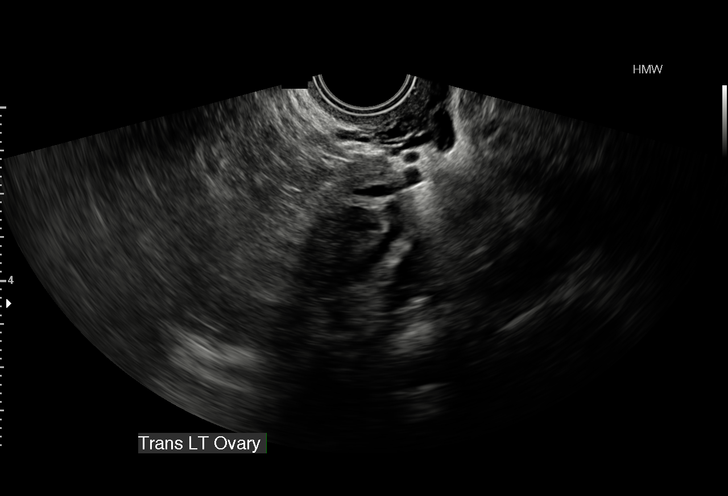
[im 64/70]
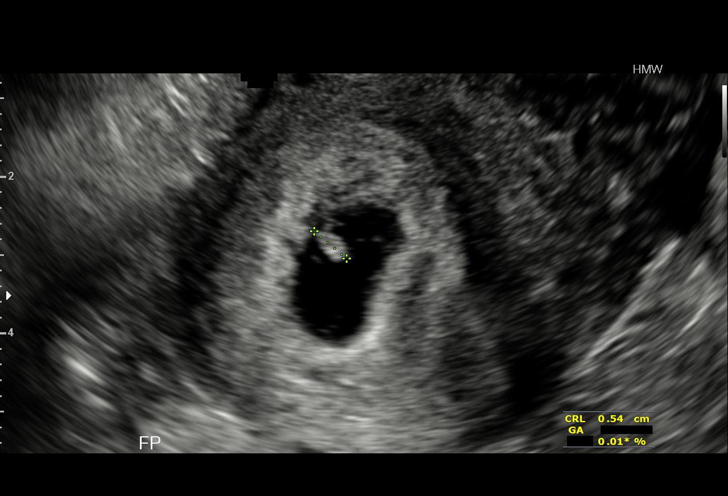
[im 70/70]
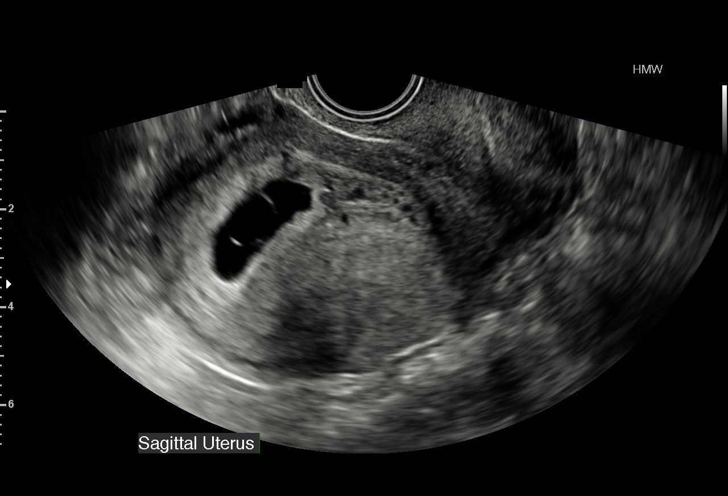

[15 of 28 positions shown; findings below may reference images not displayed]

FINDINGS: Intrauterine gestational sac: Visualized/normal in shape.

Yolk sac:  Present

Embryo:  Present

Cardiac Activity: Absent

MSD: 1.9 cm  mm   6 w   6  d

CRL:  5  mm   6 w   2 d

Maternal uterus/adnexae: Small subchorionic hemorrhage is
identified. No abnormality of the ovaries is noted.
IMPRESSION: Single intrauterine gestation with absent fetal cardiac activity.
Findings are suspicious but not yet definitive for failed pregnancy.
Recommend follow-up US in 10-14 days for definitive diagnosis. This
recommendation follows SRU consensus guidelines: Diagnostic Criteria
for Nonviable Pregnancy Early in the First Trimester. N Engl J Med
4838; [DATE].

Small subchorionic hemorrhage.

## 2016-03-02 ENCOUNTER — Ambulatory Visit (HOSPITAL_COMMUNITY)
Admission: RE | Admit: 2016-03-02 | Discharge: 2016-03-02 | Disposition: A | Discharge status: Home / Self Care | Payer: BLUE CROSS/BLUE SHIELD | Source: Ambulatory Visit | Attending: Family Medicine | Admitting: Family Medicine

## 2016-03-02 ENCOUNTER — Other Ambulatory Visit: Payer: Self-pay | Admitting: Family Medicine

## 2016-03-02 DIAGNOSIS — O3680X0 Pregnancy with inconclusive fetal viability, not applicable or unspecified: Secondary | ICD-10-CM

## 2016-03-02 DIAGNOSIS — O209 Hemorrhage in early pregnancy, unspecified: Secondary | ICD-10-CM | POA: Insufficient documentation

## 2016-03-02 DIAGNOSIS — Z3A01 Less than 8 weeks gestation of pregnancy: Secondary | ICD-10-CM | POA: Diagnosis not present

## 2016-03-03 ENCOUNTER — Telehealth: Payer: Self-pay | Admitting: Obstetrics & Gynecology

## 2016-03-03 NOTE — Telephone Encounter (Signed)
Patient calling to get US Results

## 2016-03-07 NOTE — Telephone Encounter (Signed)
Pt has been informed of results

## 2016-03-27 ENCOUNTER — Ambulatory Visit (INDEPENDENT_AMBULATORY_CARE_PROVIDER_SITE_OTHER): Payer: BLUE CROSS/BLUE SHIELD | Admitting: Family Medicine

## 2016-03-27 ENCOUNTER — Encounter: Payer: Self-pay | Admitting: Family Medicine

## 2016-03-27 VITALS — BP 139/81 | Wt 249.4 lb

## 2016-03-27 DIAGNOSIS — O0990 Supervision of high risk pregnancy, unspecified, unspecified trimester: Secondary | ICD-10-CM | POA: Insufficient documentation

## 2016-03-27 DIAGNOSIS — Z113 Encounter for screening for infections with a predominantly sexual mode of transmission: Secondary | ICD-10-CM | POA: Diagnosis not present

## 2016-03-27 DIAGNOSIS — Z3481 Encounter for supervision of other normal pregnancy, first trimester: Secondary | ICD-10-CM

## 2016-03-27 DIAGNOSIS — Z8759 Personal history of other complications of pregnancy, childbirth and the puerperium: Secondary | ICD-10-CM

## 2016-03-27 DIAGNOSIS — IMO0001 Reserved for inherently not codable concepts without codable children: Secondary | ICD-10-CM | POA: Insufficient documentation

## 2016-03-27 DIAGNOSIS — R030 Elevated blood-pressure reading, without diagnosis of hypertension: Secondary | ICD-10-CM

## 2016-03-27 DIAGNOSIS — O99210 Obesity complicating pregnancy, unspecified trimester: Secondary | ICD-10-CM

## 2016-03-27 DIAGNOSIS — O0991 Supervision of high risk pregnancy, unspecified, first trimester: Secondary | ICD-10-CM

## 2016-03-27 LAB — POCT URINALYSIS DIP (DEVICE)
Bilirubin Urine: NEGATIVE
Glucose, UA: NEGATIVE mg/dL
Hgb urine dipstick: NEGATIVE
Ketones, ur: NEGATIVE mg/dL
Leukocytes, UA: NEGATIVE
NITRITE: NEGATIVE
PROTEIN: NEGATIVE mg/dL
Specific Gravity, Urine: 1.015 (ref 1.005–1.030)
Urobilinogen, UA: 0.2 mg/dL (ref 0.0–1.0)
pH: 7 (ref 5.0–8.0)

## 2016-03-27 NOTE — Progress Notes (Signed)
Pt stated she has elevated blood pressure during her last pregnancy.  She is concern about her blood pressure.

## 2016-03-27 NOTE — Patient Instructions (Signed)
First Trimester of Pregnancy The first trimester of pregnancy is from week 1 until the end of week 12 (months 1 through 3). A week after a sperm fertilizes an egg, the egg will implant on the wall of the uterus. This embryo will begin to develop into a baby. Genes from you and your partner are forming the baby. The female genes determine whether the baby is a boy or a girl. At 6-8 weeks, the eyes and face are formed, and the heartbeat can be seen on ultrasound. At the end of 12 weeks, all the baby's organs are formed.  Now that you are pregnant, you will want to do everything you can to have a healthy baby. Two of the most important things are to get good prenatal care and to follow your health care provider's instructions. Prenatal care is all the medical care you receive before the baby's birth. This care will help prevent, find, and treat any problems during the pregnancy and childbirth. BODY CHANGES Your body goes through many changes during pregnancy. The changes vary from woman to woman.   You may gain or lose a couple of pounds at first.  You may feel sick to your stomach (nauseous) and throw up (vomit). If the vomiting is uncontrollable, call your health care provider.  You may tire easily.  You may develop headaches that can be relieved by medicines approved by your health care provider.  You may urinate more often. Painful urination may mean you have a bladder infection.  You may develop heartburn as a result of your pregnancy.  You may develop constipation because certain hormones are causing the muscles that push waste through your intestines to slow down.  You may develop hemorrhoids or swollen, bulging veins (varicose veins).  Your breasts may begin to grow larger and become tender. Your nipples may stick out more, and the tissue that surrounds them (areola) may become darker.  Your gums may bleed and may be sensitive to brushing and flossing.  Dark spots or blotches (chloasma,  mask of pregnancy) may develop on your face. This will likely fade after the baby is born.  Your menstrual periods will stop.  You may have a loss of appetite.  You may develop cravings for certain kinds of food.  You may have changes in your emotions from day to day, such as being excited to be pregnant or being concerned that something may go wrong with the pregnancy and baby.  You may have more vivid and strange dreams.  You may have changes in your hair. These can include thickening of your hair, rapid growth, and changes in texture. Some women also have hair loss during or after pregnancy, or hair that feels dry or thin. Your hair will most likely return to normal after your baby is born. WHAT TO EXPECT AT YOUR PRENATAL VISITS During a routine prenatal visit:  You will be weighed to make sure you and the baby are growing normally.  Your blood pressure will be taken.  Your abdomen will be measured to track your baby's growth.  The fetal heartbeat will be listened to starting around week 10 or 12 of your pregnancy.  Test results from any previous visits will be discussed. Your health care provider may ask you:  How you are feeling.  If you are feeling the baby move.  If you have had any abnormal symptoms, such as leaking fluid, bleeding, severe headaches, or abdominal cramping.  If you are using any tobacco products,   including cigarettes, chewing tobacco, and electronic cigarettes.  If you have any questions. Other tests that may be performed during your first trimester include:  Blood tests to find your blood type and to check for the presence of any previous infections. They will also be used to check for low iron levels (anemia) and Rh antibodies. Later in the pregnancy, blood tests for diabetes will be done along with other tests if problems develop.  Urine tests to check for infections, diabetes, or protein in the urine.  An ultrasound to confirm the proper growth  and development of the baby.  An amniocentesis to check for possible genetic problems.  Fetal screens for spina bifida and Down syndrome.  You may need other tests to make sure you and the baby are doing well.  HIV (human immunodeficiency virus) testing. Routine prenatal testing includes screening for HIV, unless you choose not to have this test. HOME CARE INSTRUCTIONS  Medicines  Follow your health care provider's instructions regarding medicine use. Specific medicines may be either safe or unsafe to take during pregnancy.  Take your prenatal vitamins as directed.  If you develop constipation, try taking a stool softener if your health care provider approves. Diet  Eat regular, well-balanced meals. Choose a variety of foods, such as meat or vegetable-based protein, fish, milk and low-fat dairy products, vegetables, fruits, and whole grain breads and cereals. Your health care provider will help you determine the amount of weight gain that is right for you.  Avoid raw meat and uncooked cheese. These carry germs that can cause birth defects in the baby.  Eating four or five small meals rather than three large meals a day may help relieve nausea and vomiting. If you start to feel nauseous, eating a few soda crackers can be helpful. Drinking liquids between meals instead of during meals also seems to help nausea and vomiting.  If you develop constipation, eat more high-fiber foods, such as fresh vegetables or fruit and whole grains. Drink enough fluids to keep your urine clear or pale yellow. Activity and Exercise  Exercise only as directed by your health care provider. Exercising will help you:  Control your weight.  Stay in shape.  Be prepared for labor and delivery.  Experiencing pain or cramping in the lower abdomen or low back is a good sign that you should stop exercising. Check with your health care provider before continuing normal exercises.  Try to avoid standing for long  periods of time. Move your legs often if you must stand in one place for a long time.  Avoid heavy lifting.  Wear low-heeled shoes, and practice good posture.  You may continue to have sex unless your health care provider directs you otherwise. Relief of Pain or Discomfort  Wear a good support bra for breast tenderness.   Take warm sitz baths to soothe any pain or discomfort caused by hemorrhoids. Use hemorrhoid cream if your health care provider approves.   Rest with your legs elevated if you have leg cramps or low back pain.  If you develop varicose veins in your legs, wear support hose. Elevate your feet for 15 minutes, 3-4 times a day. Limit salt in your diet. Prenatal Care  Schedule your prenatal visits by the twelfth week of pregnancy. They are usually scheduled monthly at first, then more often in the last 2 months before delivery.  Write down your questions. Take them to your prenatal visits.  Keep all your prenatal visits as directed by your   health care provider. Safety  Wear your seat belt at all times when driving.  Make a list of emergency phone numbers, including numbers for family, friends, the hospital, and police and fire departments. General Tips  Ask your health care provider for a referral to a local prenatal education class. Begin classes no later than at the beginning of month 6 of your pregnancy.  Ask for help if you have counseling or nutritional needs during pregnancy. Your health care provider can offer advice or refer you to specialists for help with various needs.  Do not use hot tubs, steam rooms, or saunas.  Do not douche or use tampons or scented sanitary pads.  Do not cross your legs for long periods of time.  Avoid cat litter boxes and soil used by cats. These carry germs that can cause birth defects in the baby and possibly loss of the fetus by miscarriage or stillbirth.  Avoid all smoking, herbs, alcohol, and medicines not prescribed by  your health care provider. Chemicals in these affect the formation and growth of the baby.  Do not use any tobacco products, including cigarettes, chewing tobacco, and electronic cigarettes. If you need help quitting, ask your health care provider. You may receive counseling support and other resources to help you quit.  Schedule a dentist appointment. At home, brush your teeth with a soft toothbrush and be gentle when you floss. SEEK MEDICAL CARE IF:   You have dizziness.  You have mild pelvic cramps, pelvic pressure, or nagging pain in the abdominal area.  You have persistent nausea, vomiting, or diarrhea.  You have a bad smelling vaginal discharge.  You have pain with urination.  You notice increased swelling in your face, hands, legs, or ankles. SEEK IMMEDIATE MEDICAL CARE IF:   You have a fever.  You are leaking fluid from your vagina.  You have spotting or bleeding from your vagina.  You have severe abdominal cramping or pain.  You have rapid weight gain or loss.  You vomit blood or material that looks like coffee grounds.  You are exposed to German measles and have never had them.  You are exposed to fifth disease or chickenpox.  You develop a severe headache.  You have shortness of breath.  You have any kind of trauma, such as from a fall or a car accident.   This information is not intended to replace advice given to you by your health care provider. Make sure you discuss any questions you have with your health care provider.   Document Released: 11/21/2001 Document Revised: 12/18/2014 Document Reviewed: 10/07/2013 Elsevier Interactive Patient Education 2016 [employer]  

## 2016-03-27 NOTE — Progress Notes (Signed)
U/S for first trimester screen scheduled for May 1st @ 1200pm. U/S for anatomy scheduled for June 12th @ 10am.

## 2016-03-27 NOTE — Progress Notes (Signed)
Subjective:  Barbara Robertson is a 25 y.o. G2P1001 at 5835w5d being seen today for ongoing prenatal care.  She is currently monitored for the following issues for this low-risk pregnancy and has Rh negative status during pregnancy, antepartum; Obesity in pregnancy; History of gestational hypertension; Morbid obesity (HCC); Supervision of high risk pregnancy, antepartum; and Elevated BP on her problem list.  Patient reports no complaints.   .  .  Movement: Absent. Denies leaking of fluid.   The following portions of the patient's history were reviewed and updated as appropriate: allergies, current medications, past family history, past medical history, past social history, past surgical history and problem list. Problem list updated.  Objective:   Filed Vitals:   03/27/16 0805  BP: 139/81  Weight: 249 lb 6.4 oz (113.127 kg)    Fetal Status:     Movement: Absent     General:  Alert, oriented and cooperative. Patient is in no acute distress.  Skin: Skin is warm and dry. No rash noted.   Cardiovascular: Normal heart rate noted  Respiratory: Normal respiratory effort, no problems with respiration noted  Abdomen: Soft, gravid, appropriate for gestational age. Pain/Pressure: Present     Pelvic:       Cervical exam deferred        Extremities: Normal range of motion.  Edema: None  Mental Status: Normal mood and affect. Normal behavior. Normal judgment and thought content.   Urinalysis:      Assessment and Plan:  Pregnancy: G2P1001 at 4335w5d  1. Supervision of high risk pregnancy, antepartum, first trimester - added C[facility] Box - Glucose Tolerance, 1 HR (50g) - Culture, OB Urine - Prenatal Profile - Prescript Monitor Profile(19) - GC/Chlamydia probe amp ([place]not at [facility]  2. Obesity: discussed weight gain in pregnancy 11-15 lbs, recommended a healthy diet and exercise but not losing weight in pregnancy  Preterm labor symptoms and general obstetric precautions including but not limited  to vaginal bleeding, contractions, leaking of fluid and fetal movement were reviewed in detail with the patient. Please refer to After Visit Summary for other counseling recommendations.  Return in about 4 weeks (around 04/24/2016) for Routine prenatal care.   Federico FlakeKimberly Niles Newton, MD

## 2016-03-28 LAB — PRENATAL PROFILE (SOLSTAS)
Antibody Screen: NEGATIVE
Basophils Absolute: 0 cells/uL (ref 0–200)
Basophils Relative: 0 %
EOS PCT: 1 %
Eosinophils Absolute: 78 cells/uL (ref 15–500)
HEMATOCRIT: 41.4 % (ref 35.0–45.0)
HEMOGLOBIN: 13.8 g/dL (ref 11.7–15.5)
HIV: NONREACTIVE
Hepatitis B Surface Ag: NEGATIVE
LYMPHS PCT: 18 %
Lymphs Abs: 1404 cells/uL (ref 850–3900)
MCH: 29.9 pg (ref 27.0–33.0)
MCHC: 33.3 g/dL (ref 32.0–36.0)
MCV: 89.6 fL (ref 80.0–100.0)
MPV: 10.2 fL (ref 7.5–12.5)
Monocytes Absolute: 390 cells/uL (ref 200–950)
Monocytes Relative: 5 %
NEUTROS PCT: 76 %
Neutro Abs: 5928 cells/uL (ref 1500–7800)
Platelets: 327 10*3/uL (ref 140–400)
RBC: 4.62 MIL/uL (ref 3.80–5.10)
RDW: 13.4 % (ref 11.0–15.0)
Rh Type: NEGATIVE
Rubella: 2.29 Index — ABNORMAL HIGH (ref <0.90)
WBC: 7.8 10*3/uL (ref 3.8–10.8)

## 2016-03-28 LAB — CULTURE, OB URINE: Colony Count: 15000

## 2016-03-28 LAB — BASIC METABOLIC PANEL
BUN: 7 mg/dL (ref 7–25)
CALCIUM: 8.8 mg/dL (ref 8.6–10.2)
CHLORIDE: 103 mmol/L (ref 98–110)
CO2: 22 mmol/L (ref 20–31)
CREATININE: 0.56 mg/dL (ref 0.50–1.10)
GLUCOSE: 113 mg/dL — AB (ref 65–99)
POTASSIUM: 3.9 mmol/L (ref 3.5–5.3)
SODIUM: 134 mmol/L — AB (ref 135–146)

## 2016-03-28 LAB — PROTEIN / CREATININE RATIO, URINE
Creatinine, Urine: 190 mg/dL (ref 20–320)
PROTEIN CREATININE RATIO: 58 mg/g{creat} (ref 21–161)
TOTAL PROTEIN, URINE: 11 mg/dL (ref 5–24)

## 2016-03-28 LAB — GLUCOSE TOLERANCE, 1 HOUR (50G) W/O FASTING: GLUCOSE, 1 HR, GESTATIONAL: 107 mg/dL (ref <140)

## 2016-03-29 ENCOUNTER — Encounter (HOSPITAL_COMMUNITY): Payer: Self-pay | Admitting: Family Medicine

## 2016-03-30 ENCOUNTER — Encounter: Payer: Self-pay | Admitting: Obstetrics & Gynecology

## 2016-04-03 ENCOUNTER — Telehealth: Payer: Self-pay

## 2016-04-03 MED ORDER — TERCONAZOLE 0.4 % VA CREA: 1.0000 | TOPICAL_CREAM | Freq: Every day | VAGINAL | Status: DC

## 2016-04-03 NOTE — Telephone Encounter (Signed)
Pt called and stated that she has a yeast infection and would like something to be called in.  Called pt and informed pt that an Rx of Terazol cream has been sent to her [facility] off [address] according to protocol.  Pt stated understanding with no further questions.

## 2016-04-10 ENCOUNTER — Other Ambulatory Visit: Payer: Self-pay | Admitting: Family Medicine

## 2016-04-10 ENCOUNTER — Ambulatory Visit (HOSPITAL_COMMUNITY)
Admission: RE | Admit: 2016-04-10 | Discharge: 2016-04-10 | Disposition: A | Discharge status: Home / Self Care | Payer: BLUE CROSS/BLUE SHIELD | Source: Ambulatory Visit | Attending: Family Medicine | Admitting: Family Medicine

## 2016-04-10 ENCOUNTER — Encounter (HOSPITAL_COMMUNITY): Payer: Self-pay

## 2016-04-10 DIAGNOSIS — O0991 Supervision of high risk pregnancy, unspecified, first trimester: Secondary | ICD-10-CM | POA: Insufficient documentation

## 2016-04-10 DIAGNOSIS — Z3A12 12 weeks gestation of pregnancy: Secondary | ICD-10-CM | POA: Diagnosis present

## 2016-04-10 DIAGNOSIS — Z36 Encounter for antenatal screening of mother: Secondary | ICD-10-CM | POA: Insufficient documentation

## 2016-04-13 ENCOUNTER — Other Ambulatory Visit (HOSPITAL_COMMUNITY): Payer: Self-pay

## 2016-04-24 ENCOUNTER — Encounter: Payer: Self-pay | Admitting: Obstetrics and Gynecology

## 2016-04-24 ENCOUNTER — Ambulatory Visit (INDEPENDENT_AMBULATORY_CARE_PROVIDER_SITE_OTHER): Payer: BLUE CROSS/BLUE SHIELD | Admitting: Obstetrics and Gynecology

## 2016-04-24 VITALS — BP 126/75 | HR 92 | Wt 249.5 lb

## 2016-04-24 DIAGNOSIS — O360120 Maternal care for anti-D [Rh] antibodies, second trimester, not applicable or unspecified: Secondary | ICD-10-CM

## 2016-04-24 DIAGNOSIS — Z8759 Personal history of other complications of pregnancy, childbirth and the puerperium: Secondary | ICD-10-CM

## 2016-04-24 DIAGNOSIS — O0992 Supervision of high risk pregnancy, unspecified, second trimester: Secondary | ICD-10-CM

## 2016-04-24 DIAGNOSIS — O99210 Obesity complicating pregnancy, unspecified trimester: Secondary | ICD-10-CM

## 2016-04-24 LAB — POCT URINALYSIS DIP (DEVICE)
Bilirubin Urine: NEGATIVE
Glucose, UA: NEGATIVE mg/dL
Ketones, ur: NEGATIVE mg/dL
Leukocytes, UA: NEGATIVE
NITRITE: NEGATIVE
Protein, ur: NEGATIVE mg/dL
SPECIFIC GRAVITY, URINE: 1.015 (ref 1.005–1.030)
Urobilinogen, UA: 0.2 mg/dL (ref 0.0–1.0)
pH: 7 (ref 5.0–8.0)

## 2016-04-24 MED ORDER — ASPIRIN EC 81 MG PO TBEC: 81.0000 mg | DELAYED_RELEASE_TABLET | Freq: Every day | ORAL | Status: DC

## 2016-04-24 NOTE — Progress Notes (Signed)
Subjective:  Barbara Robertson is a 25 y.o. G3P1011 at 6574w5d being seen today for ongoing prenatal care.  She is currently monitored for the following issues for this low-risk pregnancy and has Rh negative status during pregnancy, antepartum; Obesity in pregnancy; History of gestational hypertension; Morbid obesity (HCC); Supervision of high risk pregnancy, antepartum; and Elevated BP on her problem list.  Patient reports no complaints.   .  .  Movement: Absent. Denies leaking of fluid.   The following portions of the patient's history were reviewed and updated as appropriate: allergies, current medications, past family history, past medical history, past social history, past surgical history and problem list. Problem list updated.  Objective:   Filed Vitals:   04/24/16 1000  BP: 126/75  Pulse: 92  Weight: 249 lb 8 oz (113.172 kg)    Fetal Status: Fetal Heart Rate (bpm): 145   Movement: Absent     General:  Alert, oriented and cooperative. Patient is in no acute distress.  Skin: Skin is warm and dry. No rash noted.   Cardiovascular: Normal heart rate noted  Respiratory: Normal respiratory effort, no problems with respiration noted  Abdomen: Soft, gravid, appropriate for gestational age. Pain/Pressure: Present     Pelvic:       Cervical exam deferred        Extremities: Normal range of motion.  Edema: None  Mental Status: Normal mood and affect. Normal behavior. Normal judgment and thought content.   Urinalysis:      Assessment and Plan:  Pregnancy: G3P1011 at 7174w5d  1. Rh negative status during pregnancy, antepartum, second trimester, not applicable or unspecified fetus Will need rhophylac at 28 weeks  2. Supervision of high risk pregnancy, antepartum, second trimester Continue current care AFP next visit Anatomy ultrasound on 6/12  3. Morbid obesity, unspecified obesity type (HCC)  4. History of gestational hypertension Will start ASA 81 mg daily  5. Obesity in  pregnancy   General obstetric precautions including but not limited to vaginal bleeding, contractions, leaking of fluid and fetal movement were reviewed in detail with the patient. Please refer to After Visit Summary for other counseling recommendations.  Return in about 4 weeks (around 05/22/2016).   Catalina AntiguaPeggy Aariona Momon, MD

## 2016-05-22 ENCOUNTER — Ambulatory Visit (INDEPENDENT_AMBULATORY_CARE_PROVIDER_SITE_OTHER): Payer: BLUE CROSS/BLUE SHIELD | Admitting: Obstetrics & Gynecology

## 2016-05-22 ENCOUNTER — Ambulatory Visit (HOSPITAL_COMMUNITY)
Admission: RE | Admit: 2016-05-22 | Discharge: 2016-05-22 | Disposition: A | Discharge status: Home / Self Care | Payer: BLUE CROSS/BLUE SHIELD | Source: Ambulatory Visit | Attending: Family Medicine | Admitting: Family Medicine

## 2016-05-22 ENCOUNTER — Other Ambulatory Visit: Payer: Self-pay | Admitting: Family Medicine

## 2016-05-22 VITALS — BP 131/82 | HR 80 | Wt 248.3 lb

## 2016-05-22 DIAGNOSIS — Z1389 Encounter for screening for other disorder: Secondary | ICD-10-CM

## 2016-05-22 DIAGNOSIS — Z3A18 18 weeks gestation of pregnancy: Secondary | ICD-10-CM | POA: Insufficient documentation

## 2016-05-22 DIAGNOSIS — O0992 Supervision of high risk pregnancy, unspecified, second trimester: Secondary | ICD-10-CM | POA: Diagnosis not present

## 2016-05-22 DIAGNOSIS — Z363 Encounter for antenatal screening for malformations: Secondary | ICD-10-CM

## 2016-05-22 DIAGNOSIS — O0991 Supervision of high risk pregnancy, unspecified, first trimester: Secondary | ICD-10-CM | POA: Diagnosis present

## 2016-05-22 LAB — POCT URINALYSIS DIP (DEVICE)
BILIRUBIN URINE: NEGATIVE
Glucose, UA: NEGATIVE mg/dL
HGB URINE DIPSTICK: NEGATIVE
KETONES UR: NEGATIVE mg/dL
Leukocytes, UA: NEGATIVE
Nitrite: NEGATIVE
PH: 6.5 (ref 5.0–8.0)
Protein, ur: NEGATIVE mg/dL
SPECIFIC GRAVITY, URINE: 1.015 (ref 1.005–1.030)
Urobilinogen, UA: 0.2 mg/dL (ref 0.0–1.0)

## 2016-05-22 NOTE — Patient Instructions (Signed)

## 2016-05-22 NOTE — Progress Notes (Signed)
Home Medicaid Form Completed  

## 2016-05-22 NOTE — Progress Notes (Signed)
Subjective:US was done today  Barbara Robertson is a 25 y.o. G3P1011 at 6342w5d being seen today for ongoing prenatal care.  She is currently monitored for the following issues for this high-risk pregnancy and has Rh negative status during pregnancy, antepartum; Obesity in pregnancy; History of gestational hypertension; Morbid obesity (HCC); Supervision of high risk pregnancy, antepartum; and Elevated BP on her problem list.  Patient reports no complaints.  Contractions: Not present. Vag. Bleeding: None.  Movement: Present. Denies leaking of fluid.   The following portions of the patient's history were reviewed and updated as appropriate: allergies, current medications, past family history, past medical history, past social history, past surgical history and problem list. Problem list updated.  Objective:   Filed Vitals:   05/22/16 0956 05/22/16 1131  BP: 146/68 131/82  Pulse: 80   Weight: 248 lb 4.8 oz (112.628 kg)     Fetal Status:     Movement: Present     General:  Alert, oriented and cooperative. Patient is in no acute distress.  Skin: Skin is warm and dry. No rash noted.   Cardiovascular: Normal heart rate noted  Respiratory: Normal respiratory effort, no problems with respiration noted  Abdomen: Soft, gravid, appropriate for gestational age. Pain/Pressure: Present     Pelvic: Cervical exam deferred        Extremities: Normal range of motion.  Edema: None  Mental Status: Normal mood and affect. Normal behavior. Normal judgment and thought content.   Urinalysis: Urine Protein: Negative Urine Glucose: Negative  Assessment and Plan:  Pregnancy: G3P1011 at 1142w5d  1. Supervision of high risk pregnancy, antepartum, second trimester H/o GHTN  Preterm labor symptoms and general obstetric precautions including but not limited to vaginal bleeding, contractions, leaking of fluid and fetal movement were reviewed in detail with the patient. Please refer to After Visit Summary for other  counseling recommendations.  Return in about 4 weeks (around 06/19/2016).   Adam PhenixJames G Arnold, MD

## 2016-05-23 ENCOUNTER — Telehealth: Payer: Self-pay | Admitting: *Deleted

## 2016-05-23 DIAGNOSIS — IMO0002 Reserved for concepts with insufficient information to code with codable children: Secondary | ICD-10-CM

## 2016-05-23 DIAGNOSIS — Z0489 Encounter for examination and observation for other specified reasons: Secondary | ICD-10-CM

## 2016-05-23 NOTE — Telephone Encounter (Signed)
Pt left message requesting that appt for her follow up US appt be scheduled. I called pt and informed her that I have scheduled the US on 7/12 @ 0930 prior to her next prenatal visit on the same day @ 1040. Pt agreed to appt and voiced understanding.

## 2016-05-25 ENCOUNTER — Telehealth: Payer: Self-pay | Admitting: General Practice

## 2016-05-25 NOTE — Telephone Encounter (Signed)
Patient called into front office this morning stating her dog knocked her over and she fell her on butt and foot but not her belly. Patient states she isn't hurting or bleeding just pressure but she has been feeling that. Told patient it sounds okay but if she starts hurting or bleeding to go to [facility]U for evaluation. Patient verbalized understanding and states she sometimes has a lot of pressure and some mild cramping if she is up and moving too much but things settle down in a few minutes if she takes a break. Told patient to keep an eye on how she feels to make sure it doesn't progress to contractions. Told patient it is likely due to the growing pregnancy/baby and her having to take care of her little one. Patient verbalized understanding & had no questions

## 2016-06-21 ENCOUNTER — Ambulatory Visit (HOSPITAL_COMMUNITY): Payer: BLUE CROSS/BLUE SHIELD

## 2016-06-21 ENCOUNTER — Encounter: Payer: BLUE CROSS/BLUE SHIELD | Admitting: Certified Nurse Midwife

## 2016-06-26 ENCOUNTER — Ambulatory Visit (HOSPITAL_COMMUNITY)
Admission: RE | Admit: 2016-06-26 | Discharge: 2016-06-26 | Disposition: A | Discharge status: Home / Self Care | Payer: BLUE CROSS/BLUE SHIELD | Source: Ambulatory Visit | Attending: Obstetrics & Gynecology | Admitting: Obstetrics & Gynecology

## 2016-06-26 ENCOUNTER — Ambulatory Visit (INDEPENDENT_AMBULATORY_CARE_PROVIDER_SITE_OTHER): Payer: BLUE CROSS/BLUE SHIELD | Admitting: Obstetrics & Gynecology

## 2016-06-26 ENCOUNTER — Encounter (HOSPITAL_COMMUNITY): Payer: Self-pay

## 2016-06-26 VITALS — BP 130/81 | HR 86 | Wt 248.3 lb

## 2016-06-26 VITALS — BP 139/74 | HR 87 | Wt 249.8 lb

## 2016-06-26 DIAGNOSIS — O09299 Supervision of pregnancy with other poor reproductive or obstetric history, unspecified trimester: Secondary | ICD-10-CM | POA: Insufficient documentation

## 2016-06-26 DIAGNOSIS — O10019 Pre-existing essential hypertension complicating pregnancy, unspecified trimester: Secondary | ICD-10-CM | POA: Insufficient documentation

## 2016-06-26 DIAGNOSIS — O283 Abnormal ultrasonic finding on antenatal screening of mother: Secondary | ICD-10-CM | POA: Diagnosis not present

## 2016-06-26 DIAGNOSIS — O119 Pre-existing hypertension with pre-eclampsia, unspecified trimester: Secondary | ICD-10-CM

## 2016-06-26 DIAGNOSIS — IMO0002 Reserved for concepts with insufficient information to code with codable children: Secondary | ICD-10-CM

## 2016-06-26 DIAGNOSIS — Z3A23 23 weeks gestation of pregnancy: Secondary | ICD-10-CM | POA: Diagnosis not present

## 2016-06-26 DIAGNOSIS — Z0489 Encounter for examination and observation for other specified reasons: Secondary | ICD-10-CM

## 2016-06-26 DIAGNOSIS — O10912 Unspecified pre-existing hypertension complicating pregnancy, second trimester: Secondary | ICD-10-CM

## 2016-06-26 DIAGNOSIS — O10919 Unspecified pre-existing hypertension complicating pregnancy, unspecified trimester: Secondary | ICD-10-CM

## 2016-06-26 LAB — POCT URINALYSIS DIP (DEVICE)
BILIRUBIN URINE: NEGATIVE
Glucose, UA: NEGATIVE mg/dL
HGB URINE DIPSTICK: NEGATIVE
Ketones, ur: NEGATIVE mg/dL
Nitrite: NEGATIVE
PH: 8.5 — AB (ref 5.0–8.0)
Protein, ur: 30 mg/dL — AB
SPECIFIC GRAVITY, URINE: 1.015 (ref 1.005–1.030)
Urobilinogen, UA: 0.2 mg/dL (ref 0.0–1.0)

## 2016-06-26 NOTE — Progress Notes (Signed)
Pt requests something for anxiety; reports husband is currently not working and is having financial difficulty.  Declines seeing Glen AubreyJamie, SW.

## 2016-06-27 NOTE — Progress Notes (Signed)
Subjective:  Charlesetta ShanksMindy B Carline is a 25 y.o. G3P1011 at 7974w6d being seen today for ongoing prenatal care.  She is currently monitored for the following issues for this high-risk pregnancy and has Rh negative status during pregnancy, antepartum; Obesity in pregnancy; History of gestational hypertension; Morbid obesity (HCC); Supervision of high risk pregnancy, antepartum; Elevated BP; and Chronic hypertension during pregnancy, antepartum on her problem list.  Patient reports anxiety due to husband having broken ankle and can't work.  He does not have disability insurance.  Contractions: Not present. Vag. Bleeding: None.  Movement: Present. Denies leaking of fluid.   The following portions of the patient's history were reviewed and updated as appropriate: allergies, current medications, past family history, past medical history, past social history, past surgical history and problem list. Problem list updated.  Objective:   Filed Vitals:   06/26/16 1018  BP: 130/81  Pulse: 86  Weight: 248 lb 4.8 oz (112.628 kg)    Fetal Status: Fetal Heart Rate (bpm): 151   Movement: Present     General:  Alert, oriented and cooperative. Patient is in no acute distress.  Skin: Skin is warm and dry. No rash noted.   Cardiovascular: Normal heart rate noted  Respiratory: Normal respiratory effort, no problems with respiration noted  Abdomen: Soft, gravid, appropriate for gestational age. Pain/Pressure: Present     Pelvic:  Cervical exam deferred        Extremities: Normal range of motion.  Edema: None  Mental Status: Normal mood and affect. Normal behavior. Normal judgment and thought content.   Urinalysis: Urine Protein: 1+ Urine Glucose: Negative  Assessment and Plan:  Pregnancy: G3P1011 at 1974w6d  1. Chronic hypertension during pregnancy, antepartum Reviewed chart.  There have been 2 visits that Minday was noted to have mild HTN when she was not pregnant.  She also had a mild elevation at 11 weeks of  pregnancy in the MAU.  I would consider her a mild CHTN, not requiring meds.  She has a baseline protein/creatinine ratio that is normal.  She will need 2x weekly testing and delivery by 40 weeks if she remains on no meds.  She does take baby asa.  2.  Anxiety and social stress Pt does not want to talk to LCSW today.  She doesn't like to speak to professionals about her personal affairs.  I encouraged her to meet her and just talk, but she refused this offer.  Pt denies any SI/HI.  Preterm labor symptoms and general obstetric precautions including but not limited to vaginal bleeding, contractions, leaking of fluid and fetal movement were reviewed in detail with the patient. Please refer to After Visit Summary for other counseling recommendations.  Return in about 2 weeks (around 07/10/2016) for Routine OB.   Lesly DukesKelly H Yarelli Decelles, MD

## 2016-07-01 ENCOUNTER — Encounter (HOSPITAL_COMMUNITY): Payer: Self-pay | Admitting: Emergency Medicine

## 2016-07-01 ENCOUNTER — Emergency Department (HOSPITAL_COMMUNITY)
Admission: EM | Admit: 2016-07-01 | Discharge: 2016-07-01 | Disposition: A | Discharge status: Home / Self Care | Payer: BLUE CROSS/BLUE SHIELD | Attending: Emergency Medicine | Admitting: Emergency Medicine

## 2016-07-01 DIAGNOSIS — X58XXXA Exposure to other specified factors, initial encounter: Secondary | ICD-10-CM | POA: Insufficient documentation

## 2016-07-01 DIAGNOSIS — T63441A Toxic effect of venom of bees, accidental (unintentional), initial encounter: Secondary | ICD-10-CM | POA: Diagnosis not present

## 2016-07-01 DIAGNOSIS — Z7982 Long term (current) use of aspirin: Secondary | ICD-10-CM | POA: Insufficient documentation

## 2016-07-01 DIAGNOSIS — Z3A24 24 weeks gestation of pregnancy: Secondary | ICD-10-CM | POA: Diagnosis not present

## 2016-07-01 DIAGNOSIS — Y92007 Garden or yard of unspecified non-institutional (private) residence as the place of occurrence of the external cause: Secondary | ICD-10-CM | POA: Diagnosis not present

## 2016-07-01 DIAGNOSIS — O26892 Other specified pregnancy related conditions, second trimester: Secondary | ICD-10-CM | POA: Insufficient documentation

## 2016-07-01 DIAGNOSIS — Y9389 Activity, other specified: Secondary | ICD-10-CM | POA: Diagnosis not present

## 2016-07-01 DIAGNOSIS — Y999 Unspecified external cause status: Secondary | ICD-10-CM | POA: Insufficient documentation

## 2016-07-01 DIAGNOSIS — S80862A Insect bite (nonvenomous), left lower leg, initial encounter: Secondary | ICD-10-CM | POA: Diagnosis present

## 2016-07-01 DIAGNOSIS — T63461A Toxic effect of venom of wasps, accidental (unintentional), initial encounter: Secondary | ICD-10-CM

## 2016-07-01 NOTE — ED Notes (Signed)
Patient c/o possible bug bites to right knee, shin, and calf. Per patient felt bites but couldn't see anything. Patient states she was outside giving dog a bath when she noticed it. Per patient itching. Patient is 6 months pregnant. Fetal movement noted per patient.

## 2016-07-01 NOTE — ED Provider Notes (Signed)
CSN: [id number]     Arrival date & time 07/01/16  1642 History   First MD Initiated Contact with Patient 07/01/16 1704     Chief Complaint  Patient presents with  . Insect Bite     (Consider location/radiation/quality/duration/timing/severity/associated sxs/prior Treatment) The history is provided by the patient.   [place] is a 25 y.o. female currently 6 months pregnant,  presenting with itchy insect bites to her left medial lower thigh and upper calf since yesterday.  She was standing in the backyard giving her dog a bath when she felt something stinging her in these sites.  By the time she able to get her pants off, she was unable to find what bit her, but does endorse there are lots of yellowjackets in their back yard.  She denies significant pain at this time, rather has very intense localized itching.  She has taken benadryl without improvement in itchinig.  She denies fevers, chills, sob, wheezing, facial or mouth edema.      Past Medical History  Diagnosis Date  . ADHD (attention deficit hyperactivity disorder)     not on meds  . Seizures (HCC)     as a child, last sz 12 yrs ago  . Pregnancy    Past Surgical History  Procedure Laterality Date  . No past surgeries     Family History  Problem Relation Age of Onset  . Arthritis Father   . Cancer Maternal Grandmother   . Diabetes Maternal Grandmother   . Arthritis Mother   . Hypertension Mother    Social History  Substance Use Topics  . Smoking status: Never Smoker   . Smokeless tobacco: Never Used  . Alcohol Use: No   OB History    Gravida Para Term Preterm AB TAB SAB Ectopic Multiple Living   3 1 1  0 1 0 1 0 0 1      Obstetric Comments   No rhogam with SAB in Nov 2016     Review of Systems  Constitutional: Negative for fever and chills.  HENT: Negative.   Respiratory: Negative for shortness of breath and wheezing.   Skin: Positive for color change.       Negative except as mentioned in HPI.     Neurological: Negative for numbness.      Allergies  Other and Prednisone  Home Medications   Prior to Admission medications   Medication Sig Start Date End Date Taking? Authorizing Provider  aspirin EC 81 MG tablet Take 1 tablet (81 mg total) by mouth daily. 04/24/16   Catalina Antigua, MD  Prenatal Vit-Fe Fumarate-FA (PRENATAL MULTIVITAMIN) TABS tablet Take 1 tablet by mouth daily at 12 noon.    Historical Provider, MD   BP 139/89 mmHg  Pulse 88  Temp(Src) 99.3 F (37.4 C) (Oral)  Resp 16  Ht 6' (1.829 m)  Wt 112.492 kg  BMI 33.63 kg/m2  SpO2 99%  LMP 01/12/2016 (Exact Date) Physical Exam  Constitutional: She appears well-developed and well-nourished. No distress.  HENT:  Head: Normocephalic.  Neck: Neck supple.  Cardiovascular: Normal rate.   Pulmonary/Chest: Effort normal. She has no wheezes.  Musculoskeletal: Normal range of motion. She exhibits no edema.  Skin: Lesion noted. There is erythema.  Localized distinct insect bites with surrounding erythema, largest site is 5 cm diameter on medial lower left thigh, smallest 1 cm upper lateral calf. She has 8 distinct stings.  No drainage, no red streaking or excoriations. No retained fb.  ED Course  Procedures (including critical care time) Labs Review Labs Reviewed - No data to display  Imaging Review No results found. I have personally reviewed and evaluated these images and lab results as part of my medical decision-making.   EKG Interpretation None      MDM   Final diagnoses:  Yellow jacket sting, accidental or unintentional, initial encounter    Advised ice therapy, may safely continue benadryl, also may try gold bond anti itch cream which is safe in pregnancy.  Prn f/u anticipated.  Discussed bp which was rechecked and better.  She is being followed by her obgyn for this condition. Denies headaches, cp, peripheral edema.    Burgess Amor, PA-C 07/01/16 1832   Margarita Grizzle, MD 07/05/16 [phone]

## 2016-07-01 NOTE — ED Notes (Signed)
Pt made aware to return if symptoms worsen or if any life threatening symptoms occur.   

## 2016-07-01 NOTE — Discharge Instructions (Signed)
Bee, Wasp, or [employer], wasps, and hornets are part of a family of insects that can sting people. These stings can cause pain and inflammation, but they are usually not serious. However, some people may have an allergic reaction to a sting. This can cause the symptoms to be more severe.  SYMPTOMS  Common symptoms of this condition include:   A red lump in the skin that sometimes has a tiny hole in the center. In some cases, a stinger may be in the center of the wound.  Pain and itching at the sting site.  Redness and swelling around the sting site. If you have an allergic reaction (localized allergic reaction), the swelling and redness may spread out from the sting site. In some cases, this reaction can continue to develop over the next 12-36 hours. In rare cases, a person may have a severe allergic reaction (anaphylactic reaction) to a sting. Symptoms of an anaphylactic reaction may include:   Wheezing or difficulty breathing.  Raised, itchy, red patches on the skin.  Nausea or vomiting.  Abdominal cramping.  Diarrhea.  Chest pain.  Fainting.  Redness of the face (flushing). DIAGNOSIS  This condition is usually diagnosed based on symptoms, medical history, and a physical exam. TREATMENT  Most stings can be treated with:   Icing to reduce swelling.  Medicines (antihistamines) to treat itching or an allergic reaction.  Medicines to help reduce pain. These may be medicines that you take by mouth, or medicated creams or lotions that you apply to your skin. If you were stung by a bee, the stinger and a small sac of poison may be in the wound. This may be removed by brushing across it with a flat card, such as a credit card. Another method is to pinch the area and pull it out. These methods can help reduce the severity of the body's reaction to the sting.  HOME CARE INSTRUCTIONS   Wash the sting site daily with soap and water as told by your health care provider.  Apply  or take over-the-counter and prescription medicines only as told by your health care provider.  If directed, apply ice to the sting area.  Put ice in a plastic bag.  Place a towel between your skin and the bag.  Leave the ice on for 20 minutes, 2-3 times per day.  Do not scratch the sting area.  To lessen pain, try using a paste that is made of water and baking soda. Rub the paste on the sting area and leave it on for 5 minutes.  If you had a severe allergic reaction to a sting, you may need:  To wear a medical bracelet or necklace that lists the allergy.  To learn when and how to use an anaphylaxis kit or epinephrine injection. Your family members may also need to learn this.  To carry an anaphylaxis kit with you at all times. SEEK MEDICAL CARE IF:   Your symptoms do not get better in 2-3 days.  You have redness, swelling, or pain that spreads beyond the area of the sting.  You have a fever. SEEK IMMEDIATE MEDICAL CARE IF:  You have symptoms of a severe allergic reaction. These include:   Wheezing or difficulty breathing.  Chest pain.  Light-headedness or fainting.  Itchy, raised, red patches on the skin.  Nausea or vomiting.  Abdominal cramping.  Diarrhea.   This information is not intended to replace advice given to you by your health care provider.  Make sure you discuss any questions you have with your health care provider.   Document Released: 11/27/2005 Document Revised: 08/18/2015 Document Reviewed: 04/14/2015 Elsevier Interactive Patient Education 2016 [place] packs as discussed will help with pain, itching and swelling.  You may take benadryl which is safe in pregnancy.  You may also safely use Gold Bond anti itch cream which can really help with the itching as these sites heal.  This is safe in pregnancy.

## 2016-07-21 ENCOUNTER — Ambulatory Visit (INDEPENDENT_AMBULATORY_CARE_PROVIDER_SITE_OTHER): Payer: BLUE CROSS/BLUE SHIELD | Admitting: Obstetrics & Gynecology

## 2016-07-21 VITALS — BP 139/87 | HR 95 | Wt 254.7 lb

## 2016-07-21 DIAGNOSIS — O360120 Maternal care for anti-D [Rh] antibodies, second trimester, not applicable or unspecified: Secondary | ICD-10-CM | POA: Diagnosis not present

## 2016-07-21 DIAGNOSIS — O99212 Obesity complicating pregnancy, second trimester: Secondary | ICD-10-CM | POA: Diagnosis not present

## 2016-07-21 DIAGNOSIS — E669 Obesity, unspecified: Secondary | ICD-10-CM

## 2016-07-21 DIAGNOSIS — Z23 Encounter for immunization: Secondary | ICD-10-CM

## 2016-07-21 DIAGNOSIS — O0992 Supervision of high risk pregnancy, unspecified, second trimester: Secondary | ICD-10-CM

## 2016-07-21 DIAGNOSIS — K219 Gastro-esophageal reflux disease without esophagitis: Secondary | ICD-10-CM

## 2016-07-21 LAB — POCT URINALYSIS DIP (DEVICE)
Bilirubin Urine: NEGATIVE
Glucose, UA: NEGATIVE mg/dL
HGB URINE DIPSTICK: NEGATIVE
Ketones, ur: NEGATIVE mg/dL
NITRITE: NEGATIVE
PH: 6.5 (ref 5.0–8.0)
PROTEIN: NEGATIVE mg/dL
Specific Gravity, Urine: 1.01 (ref 1.005–1.030)
Urobilinogen, UA: 0.2 mg/dL (ref 0.0–1.0)

## 2016-07-21 LAB — CBC
HEMATOCRIT: 38.8 % (ref 35.0–45.0)
HEMOGLOBIN: 13.1 g/dL (ref 11.7–15.5)
MCH: 30.1 pg (ref 27.0–33.0)
MCHC: 33.8 g/dL (ref 32.0–36.0)
MCV: 89.2 fL (ref 80.0–100.0)
MPV: 10.7 fL (ref 7.5–12.5)
Platelets: 345 10*3/uL (ref 140–400)
RBC: 4.35 MIL/uL (ref 3.80–5.10)
RDW: 13.1 % (ref 11.0–15.0)
WBC: 9.8 10*3/uL (ref 3.8–10.8)

## 2016-07-21 MED ORDER — RHO D IMMUNE GLOBULIN 1500 UNIT/2ML IJ SOSY
300.0000 ug | PREFILLED_SYRINGE | Freq: Once | INTRAMUSCULAR | Status: AC
  Administered 2016-07-21: 300 ug via INTRAMUSCULAR

## 2016-07-21 MED ORDER — TETANUS-DIPHTH-ACELL PERTUSSIS 5-2.5-18.5 LF-MCG/0.5 IM SUSP
0.5000 mL | Freq: Once | INTRAMUSCULAR | Status: AC
  Administered 2016-07-21: 0.5 mL via INTRAMUSCULAR

## 2016-07-21 MED ORDER — PANTOPRAZOLE SODIUM 40 MG PO TBEC: 40.0000 mg | DELAYED_RELEASE_TABLET | Freq: Every day | ORAL | 2 refills | Status: DC

## 2016-07-21 NOTE — Patient Instructions (Signed)

## 2016-07-21 NOTE — Progress Notes (Signed)
Patient ID: [place] Shanks[place]indy B [place], female   DOB: Jul 09, 1991, 25 y.o.   MRN: [id number] Subjective:heartburn at night  [place] is a 25 y.o. G3P1011 at 4522w2d being seen today for ongoing prenatal care.  She is currently monitored for the following issues for this low-risk pregnancy and has Rh negative status during pregnancy, antepartum; Obesity in pregnancy; History of gestational hypertension; Morbid obesity (HCC); Supervision of high risk pregnancy, antepartum; Elevated BP; and Chronic hypertension during pregnancy, antepartum on her problem list.  Patient reports heartburn.  Contractions: Not present. Vag. Bleeding: None.  Movement: Present. Denies leaking of fluid.   The following portions of the patient's history were reviewed and updated as appropriate: allergies, current medications, past family history, past medical history, past social history, past surgical history and problem list. Problem list updated.  Objective:   Vitals:   07/21/16 1100  BP: 139/87  Pulse: 95  Weight: 254 lb 11.2 oz (115.5 kg)    Fetal Status: Fetal Heart Rate (bpm): 150   Movement: Present     General:  Alert, oriented and cooperative. Patient is in no acute distress.  Skin: Skin is warm and dry. No rash noted.   Cardiovascular: Normal heart rate noted  Respiratory: Normal respiratory effort, no problems with respiration noted  Abdomen: Soft, gravid, appropriate for gestational age. Pain/Pressure: Present     Pelvic:  Cervical exam deferred        Extremities: Normal range of motion.  Edema: None  Mental Status: Normal mood and affect. Normal behavior. Normal judgment and thought content.   Urinalysis: Urine Protein: Negative Urine Glucose: Negative  Assessment and Plan:  Pregnancy: G3P1011 at 4622w2d  1. Supervision of high risk pregnancy, antepartum, second trimester  - Tdap (BOOSTRIX) injection 0.5 mL; Inject 0.5 mLs into the muscle once. - Glucose Tolerance, 1 HR (50g) - HIV antibody (with  reflex) - RPR - CBC  2. Rh negative state in antepartum period, second trimester, not applicable or unspecified fetus  - rho (d) immune globulin (RHIG/RHOPHYLAC) injection 300 mcg; Inject 2 mLs (300 mcg total) into the muscle once.  3. Gastroesophageal reflux disease, esophagitis presence not specified  - pantoprazole (PROTONIX) 40 MG tablet; Take 1 tablet (40 mg total) by mouth daily.  Dispense: 30 tablet; Refill: 2  Preterm labor symptoms and general obstetric precautions including but not limited to vaginal bleeding, contractions, leaking of fluid and fetal movement were reviewed in detail with the patient. Please refer to After Visit Summary for other counseling recommendations.  3 weeks  Adam PhenixJames G Crawford Tamura, MD

## 2016-07-21 NOTE — Progress Notes (Signed)
Tdap, rhogam today 1hr gtt today

## 2016-07-22 LAB — RPR

## 2016-07-22 LAB — GLUCOSE TOLERANCE, 1 HOUR (50G) W/O FASTING: GLUCOSE, 1 HR, GESTATIONAL: 120 mg/dL (ref <140)

## 2016-07-22 LAB — HIV ANTIBODY (ROUTINE TESTING W REFLEX): HIV 1&2 Ab, 4th Generation: NONREACTIVE

## 2016-08-21 ENCOUNTER — Ambulatory Visit (HOSPITAL_COMMUNITY)
Admission: RE | Admit: 2016-08-21 | Discharge: 2016-08-21 | Disposition: A | Discharge status: Home / Self Care | Payer: BLUE CROSS/BLUE SHIELD | Source: Ambulatory Visit | Attending: Obstetrics & Gynecology | Admitting: Obstetrics & Gynecology

## 2016-08-21 ENCOUNTER — Encounter (HOSPITAL_COMMUNITY): Payer: Self-pay

## 2016-08-21 ENCOUNTER — Other Ambulatory Visit (HOSPITAL_COMMUNITY): Payer: Self-pay | Admitting: Maternal and Fetal Medicine

## 2016-08-21 ENCOUNTER — Ambulatory Visit (INDEPENDENT_AMBULATORY_CARE_PROVIDER_SITE_OTHER): Payer: BLUE CROSS/BLUE SHIELD | Admitting: Obstetrics and Gynecology

## 2016-08-21 VITALS — BP 125/81 | HR 82 | Wt 258.0 lb

## 2016-08-21 DIAGNOSIS — O283 Abnormal ultrasonic finding on antenatal screening of mother: Secondary | ICD-10-CM | POA: Diagnosis present

## 2016-08-21 DIAGNOSIS — Z3A31 31 weeks gestation of pregnancy: Secondary | ICD-10-CM

## 2016-08-21 DIAGNOSIS — O10913 Unspecified pre-existing hypertension complicating pregnancy, third trimester: Secondary | ICD-10-CM

## 2016-08-21 DIAGNOSIS — O358XX0 Maternal care for other (suspected) fetal abnormality and damage, not applicable or unspecified: Secondary | ICD-10-CM

## 2016-08-21 DIAGNOSIS — IMO0002 Reserved for concepts with insufficient information to code with codable children: Secondary | ICD-10-CM

## 2016-08-21 DIAGNOSIS — Z23 Encounter for immunization: Secondary | ICD-10-CM | POA: Diagnosis not present

## 2016-08-21 DIAGNOSIS — O35EXX0 Maternal care for other (suspected) fetal abnormality and damage, fetal genitourinary anomalies, not applicable or unspecified: Secondary | ICD-10-CM

## 2016-08-21 LAB — POCT URINALYSIS DIP (DEVICE)
Bilirubin Urine: NEGATIVE
GLUCOSE, UA: NEGATIVE mg/dL
Hgb urine dipstick: NEGATIVE
Ketones, ur: NEGATIVE mg/dL
Nitrite: NEGATIVE
Protein, ur: NEGATIVE mg/dL
SPECIFIC GRAVITY, URINE: 1.01 (ref 1.005–1.030)
UROBILINOGEN UA: 0.2 mg/dL (ref 0.0–1.0)
pH: 7 (ref 5.0–8.0)

## 2016-08-21 NOTE — Progress Notes (Signed)
   PRENATAL VISIT NOTE  Subjective:  Barbara [place] is a 25 y.o. G3P1011 at 7062w5d being seen today for ongoing prenatal care.  She is currently monitored for the following issues for this high-risk pregnancy and has Rh negative status during pregnancy, antepartum; Obesity in pregnancy; History of gestational hypertension; Morbid obesity (HCC); Supervision of high risk pregnancy, antepartum; Elevated BP; and Chronic hypertension during pregnancy, antepartum on her problem list.  Patient reports no complaints.  Contractions: Not present. Vag. Bleeding: None.  Movement: Present. Denies leaking of fluid.   The following portions of the patient's history were reviewed and updated as appropriate: allergies, current medications, past family history, past medical history, past social history, past surgical history and problem list. Problem list updated.  Objective:   Vitals:   08/21/16 1124  BP: 125/81  Pulse: 82  Weight: 258 lb (117 kg)    Fetal Status: Fetal Heart Rate (bpm): 134 Fundal Height: 31 cm Movement: Present     General:  Alert, oriented and cooperative. Patient is in no acute distress.  Skin: Skin is warm and dry. No rash noted.   Cardiovascular: Normal heart rate noted  Respiratory: Normal respiratory effort, no problems with respiration noted  Abdomen: Soft, gravid, appropriate for gestational age. Pain/Pressure: Present     Pelvic:  Cervical exam deferred        Extremities: Normal range of motion.  Edema: None  Mental Status: Normal mood and affect. Normal behavior. Normal judgment and thought content.   Urinalysis: Urine Protein: Negative Urine Glucose: Negative  Assessment and Plan:  Pregnancy: G3P1011 at 3262w5d  1. [redacted] weeks gestation of pregnancy - Flu Vaccine QUAD 36+ mos IM (Fluarix, Quad PF) -routine care follow up in 2 wks.  2. Chronic hypertension BP controlled on no medications, continue to monitor and watch for proteinuria.   Term labor symptoms and general  obstetric precautions including but not limited to vaginal bleeding, contractions, leaking of fluid and fetal movement were reviewed in detail with the patient. Please refer to After Visit Summary for other counseling recommendations.  Return in about 2 weeks (around 09/04/2016) for HROB.  Lorne SkeensNicholas Michael Macy Lingenfelter, MD

## 2016-08-21 NOTE — Patient Instructions (Signed)

## 2016-09-05 ENCOUNTER — Ambulatory Visit (INDEPENDENT_AMBULATORY_CARE_PROVIDER_SITE_OTHER): Payer: BLUE CROSS/BLUE SHIELD | Admitting: Obstetrics & Gynecology

## 2016-09-05 VITALS — BP 129/90 | HR 103 | Wt 260.2 lb

## 2016-09-05 DIAGNOSIS — O0993 Supervision of high risk pregnancy, unspecified, third trimester: Secondary | ICD-10-CM

## 2016-09-05 DIAGNOSIS — O10912 Unspecified pre-existing hypertension complicating pregnancy, second trimester: Secondary | ICD-10-CM

## 2016-09-05 DIAGNOSIS — Z36 Encounter for antenatal screening of mother: Secondary | ICD-10-CM | POA: Diagnosis not present

## 2016-09-05 DIAGNOSIS — O10919 Unspecified pre-existing hypertension complicating pregnancy, unspecified trimester: Secondary | ICD-10-CM

## 2016-09-05 LAB — POCT URINALYSIS DIP (DEVICE)
BILIRUBIN URINE: NEGATIVE
GLUCOSE, UA: NEGATIVE mg/dL
Hgb urine dipstick: NEGATIVE
KETONES UR: NEGATIVE mg/dL
Nitrite: NEGATIVE
PH: 6 (ref 5.0–8.0)
Protein, ur: NEGATIVE mg/dL
Specific Gravity, Urine: 1.02 (ref 1.005–1.030)
Urobilinogen, UA: 0.2 mg/dL (ref 0.0–1.0)

## 2016-09-05 NOTE — Patient Instructions (Signed)
Return to clinic for any scheduled appointments or obstetric concerns, or go to [facility] for evaluation Hypertension During Pregnancy Hypertension, or high blood pressure, is when there is extra pressure inside your blood vessels that carry blood from the heart to the rest of your body (arteries). It can happen at any time in life, including pregnancy. Hypertension during pregnancy can cause problems for you and your baby. Your baby might not weigh as much as he or she should at birth or might be born early (premature). Very bad cases of hypertension during pregnancy can be life-threatening.  Different types of hypertension can occur during pregnancy. These include:  Chronic hypertension. This happens when a woman has hypertension before pregnancy and it continues during pregnancy.  Gestational hypertension. This is when hypertension develops during pregnancy.  Preeclampsia or toxemia of pregnancy. This is a very serious type of hypertension that develops only during pregnancy. It affects the whole body and can be very dangerous for both mother and baby.  Gestational hypertension and preeclampsia usually go away after your baby is born. Your blood pressure will likely stabilize within 6 weeks. Women who have hypertension during pregnancy have a greater chance of developing hypertension later in life or with future pregnancies. RISK FACTORS There are certain factors that make it more likely for you to develop hypertension during pregnancy. These include:  Having hypertension before pregnancy.  Having hypertension during a previous pregnancy.  Being overweight.  Being older than 40 years.  Being pregnant with more than one baby.  Having diabetes or kidney problems. SIGNS AND SYMPTOMS Chronic and gestational hypertension rarely cause symptoms. Preeclampsia has symptoms, which may include:  Increased protein in your urine. Your health care provider will check for this at every prenatal  visit.  Swelling of your hands and face.  Rapid weight gain.  Headaches.  Visual changes.  Being bothered by light.  Abdominal pain, especially in the upper right area.  Chest pain.  Shortness of breath.  Increased reflexes.  Seizures. These occur with a more severe form of preeclampsia, called eclampsia. DIAGNOSIS  You may be diagnosed with hypertension during a regular prenatal exam. At each prenatal visit, you may have:  Your blood pressure checked.  A urine test to check for protein in your urine. The type of hypertension you are diagnosed with depends on when you developed it. It also depends on your specific blood pressure reading.  Developing hypertension before 20 weeks of pregnancy is consistent with chronic hypertension.  Developing hypertension after 20 weeks of pregnancy is consistent with gestational hypertension.  Hypertension with increased urinary protein is diagnosed as preeclampsia.  Blood pressure measurements that stay above 160 systolic or 110 diastolic are a sign of severe preeclampsia. TREATMENT Treatment for hypertension during pregnancy varies. Treatment depends on the type of hypertension and how serious it is.  If you take medicine for chronic hypertension, you may need to switch medicines.  Medicines called ACE inhibitors should not be taken during pregnancy.  Low-dose aspirin may be suggested for women who have risk factors for preeclampsia.  If you have gestational hypertension, you may need to take a blood pressure medicine that is safe during pregnancy. Your health care provider will recommend the correct medicine.  If you have severe preeclampsia, you may need to be in the hospital. Health care providers will watch you and your baby very closely. You also may need to take medicine called magnesium sulfate to prevent seizures and lower blood pressure.  Sometimes, an   early delivery is needed. This may be the case if the condition  worsens. It would be done to protect you and your baby. The only cure for preeclampsia is delivery.  Your health care provider may recommend that you take one low-dose aspirin (81 mg) each day to help prevent high blood pressure during your pregnancy if you are at risk for preeclampsia. You may be at risk for preeclampsia if:  You had preeclampsia or eclampsia during a previous pregnancy.  Your baby did not grow as expected during a previous pregnancy.  You experienced preterm birth with a previous pregnancy.  You experienced a separation of the placenta from the uterus (placental abruption) during a previous pregnancy.  You experienced the loss of your baby during a previous pregnancy.  You are pregnant with more than one baby.  You have other medical conditions, such as diabetes or an autoimmune disease. HOME CARE INSTRUCTIONS  Schedule and keep all of your regular prenatal care appointments. This is important.  Take medicines only as directed by your health care provider. Tell your health care provider about all medicines you take.  Eat as little salt as possible.  Get regular exercise.  Do not drink alcohol.  Do not use tobacco products.  Do not drink products with caffeine.  Lie on your left side when resting. SEEK IMMEDIATE MEDICAL CARE IF:  You have severe abdominal pain.  You have sudden swelling in your hands, ankles, or face.  You gain 4 pounds (1.8 kg) or more in 1 week.  You vomit repeatedly.  You have vaginal bleeding.  You do not feel your baby moving as much.  You have a headache.  You have blurred or double vision.  You have muscle twitching or spasms.  You have shortness of breath.  You have blue fingernails or lips.  You have blood in your urine. MAKE SURE YOU:  Understand these instructions.  Will watch your condition.  Will get help right away if you are not doing well or get worse.   This information is not intended to replace  advice given to you by your health care provider. Make sure you discuss any questions you have with your health care provider.   Document Released: 08/15/2011 Document Revised: 12/18/2014 Document Reviewed: 06/26/2013 Elsevier Interactive Patient Education 2016 Elsevier Inc.  

## 2016-09-05 NOTE — Progress Notes (Signed)
   PRENATAL VISIT NOTE  Subjective:  Particia B Maple Robertson is a [age] y.o. G3P1011 at 5472w6d being seen today for ongoing prenatal care.  She is currently monitored for the following issues for this high-risk pregnancy and has Rh negative status during pregnancy, antepartum; Obesity in pregnancy; History of gestational hypertension; Morbid obesity (HCC); Supervision of high risk pregnancy, antepartum; Elevated BP; and Chronic hypertension during pregnancy, antepartum on her problem list.  Patient reports no complaints.  Contractions: Not present. Vag. Bleeding: None.  Movement: Present. Denies leaking of fluid.   The following portions of the patient's history were reviewed and updated as appropriate: allergies, current medications, past family history, past medical history, past social history, past surgical history and problem list. Problem list updated.  Objective:   Vitals:   09/05/16 0810 09/05/16 0813  BP: (!) 144/84 129/90  Pulse: (!) 103   Weight: 260 lb 3.2 oz (118 kg)     Fetal Status: Fetal Heart Rate (bpm): 152 Fundal Height: 35 cm Movement: Present     General:  Alert, oriented and cooperative. Patient is in no acute distress.  Skin: Skin is warm and dry. No rash noted.   Cardiovascular: Normal heart rate noted  Respiratory: Normal respiratory effort, no problems with respiration noted  Abdomen: Soft, gravid, appropriate for gestational age. Pain/Pressure: Present     Pelvic:  Cervical exam deferred        Extremities: Normal range of motion.  Edema: None  Mental Status: Normal mood and affect. Normal behavior. Normal judgment and thought content.   Urinalysis: Urine Protein: Negative Urine Glucose: Negative NST performed today was reviewed and was found to be reactive.  AFI was also normal.   Assessment and Plan:  Pregnancy: G3P1011 at 1272w6d  1. Chronic hypertension during pregnancy, antepartum Stable BP. Continue ASA. No antihypertensive needed for now.  Continue recommended  antenatal testing and prenatal care. 08/22/16 EFW 72%.  Patient lives in [place], unable to make it for twice a week testing. Will be scheduled for weekly visits, will get BPP and visit same day (+NST if needed for BPP 6/8). - US MFM FETAL BPP WO NON STRESS; Standing  2. Supervision of high risk pregnancy, antepartum, third trimester Preterm labor symptoms and general obstetric precautions including but not limited to vaginal bleeding, contractions, leaking of fluid and fetal movement were reviewed in detail with the patient. Please refer to After Visit Summary for other counseling recommendations.  Return in about 1 week (around 09/12/2016) for OB Visit and BPP at [facility].  Tereso NewcomerUgonna A Anyanwu, MD

## 2016-09-13 ENCOUNTER — Encounter (HOSPITAL_COMMUNITY): Payer: Self-pay

## 2016-09-13 ENCOUNTER — Ambulatory Visit (HOSPITAL_COMMUNITY)
Admission: RE | Admit: 2016-09-13 | Discharge: 2016-09-13 | Disposition: A | Discharge status: Home / Self Care | Payer: BLUE CROSS/BLUE SHIELD | Source: Ambulatory Visit | Attending: Obstetrics & Gynecology | Admitting: Obstetrics & Gynecology

## 2016-09-13 ENCOUNTER — Ambulatory Visit (INDEPENDENT_AMBULATORY_CARE_PROVIDER_SITE_OTHER): Payer: BLUE CROSS/BLUE SHIELD | Admitting: Obstetrics and Gynecology

## 2016-09-13 VITALS — BP 130/70 | HR 85 | Wt 261.5 lb

## 2016-09-13 DIAGNOSIS — O10013 Pre-existing essential hypertension complicating pregnancy, third trimester: Secondary | ICD-10-CM | POA: Insufficient documentation

## 2016-09-13 DIAGNOSIS — O10913 Unspecified pre-existing hypertension complicating pregnancy, third trimester: Secondary | ICD-10-CM | POA: Diagnosis not present

## 2016-09-13 DIAGNOSIS — Z3A35 35 weeks gestation of pregnancy: Secondary | ICD-10-CM | POA: Insufficient documentation

## 2016-09-13 DIAGNOSIS — O10919 Unspecified pre-existing hypertension complicating pregnancy, unspecified trimester: Secondary | ICD-10-CM

## 2016-09-13 DIAGNOSIS — O99213 Obesity complicating pregnancy, third trimester: Secondary | ICD-10-CM | POA: Diagnosis not present

## 2016-09-13 DIAGNOSIS — O0993 Supervision of high risk pregnancy, unspecified, third trimester: Secondary | ICD-10-CM

## 2016-09-13 LAB — POCT URINALYSIS DIP (DEVICE)
BILIRUBIN URINE: NEGATIVE
Glucose, UA: NEGATIVE mg/dL
HGB URINE DIPSTICK: NEGATIVE
NITRITE: NEGATIVE
Protein, ur: NEGATIVE mg/dL
SPECIFIC GRAVITY, URINE: 1.025 (ref 1.005–1.030)
Urobilinogen, UA: 0.2 mg/dL (ref 0.0–1.0)
pH: 6.5 (ref 5.0–8.0)

## 2016-09-13 NOTE — Progress Notes (Signed)
Subjective:  Barbara [place] is a 25 y.o. G3P1011 at 3297w0d being seen today for ongoing prenatal care.  She is currently monitored for the following issues for this high-risk pregnancy and has Rh negative status during pregnancy, antepartum; Obesity in pregnancy; Morbid obesity (HCC); Supervision of high risk pregnancy, antepartum; and Chronic hypertension during pregnancy, antepartum on her problem list.  Patient reports no complaints.  Contractions: Regular. Vag. Bleeding: None.  Movement: Present. Denies leaking of fluid.   The following portions of the patient's history were reviewed and updated as appropriate: allergies, current medications, past family history, past medical history, past social history, past surgical history and problem list. Problem list updated.  Objective:   Vitals:   09/13/16 1445  BP: 130/70  Pulse: 85  Weight: 261 lb 8 oz (118.6 kg)    Fetal Status: Fetal Heart Rate (bpm): NST   Movement: Present     General:  Alert, oriented and cooperative. Patient is in no acute distress.  Skin: Skin is warm and dry. No rash noted.   Cardiovascular: Normal heart rate noted  Respiratory: Normal respiratory effort, no problems with respiration noted  Abdomen: Soft, gravid, appropriate for gestational age. Pain/Pressure: Present     Pelvic:  Cervical exam deferred        Extremities: Normal range of motion.  Edema: None  Mental Status: Normal mood and affect. Normal behavior. Normal judgment and thought content.   Urinalysis:      Assessment and Plan:  Pregnancy: G3P1011 at 3897w0d  1. Chronic hypertension during pregnancy, antepartum Continue with antenatal testing and growth U/S. Continue with BASA qd - Fetal nonstress test, reactive - US MFM FETAL BPP WO NON STRESS; Future - US MFM FETAL BPP WO NON STRESS; Future - US MFM OB FOLLOW UP; Future - US MFM FETAL BPP WO NON STRESS; Future  2. Supervision of high risk pregnancy, antepartum, third trimester   Preterm  labor symptoms and general obstetric precautions including but not limited to vaginal bleeding, contractions, leaking of fluid and fetal movement were reviewed in detail with the patient. Please refer to After Visit Summary for other counseling recommendations.  Return in about 2 weeks (around 09/27/2016) for Ob fu and NST - US @ 1230.   Hermina StaggersMichael L Mable Lashley, MD

## 2016-09-13 NOTE — Progress Notes (Signed)
BPP scheduled today and weekly @ [facility] due to pt lives far away and can only come to weekly appts.

## 2016-09-15 ENCOUNTER — Encounter (HOSPITAL_COMMUNITY): Payer: Self-pay | Admitting: *Deleted

## 2016-09-15 ENCOUNTER — Inpatient Hospital Stay (HOSPITAL_COMMUNITY)
Admission: AD | Admit: 2016-09-15 | Discharge: 2016-09-15 | Disposition: A | Discharge status: Home / Self Care | Payer: BLUE CROSS/BLUE SHIELD | Source: Ambulatory Visit | Attending: Obstetrics and Gynecology | Admitting: Obstetrics and Gynecology

## 2016-09-15 DIAGNOSIS — R55 Syncope and collapse: Secondary | ICD-10-CM | POA: Diagnosis not present

## 2016-09-15 DIAGNOSIS — Z3A35 35 weeks gestation of pregnancy: Secondary | ICD-10-CM | POA: Insufficient documentation

## 2016-09-15 DIAGNOSIS — Z3689 Encounter for other specified antenatal screening: Secondary | ICD-10-CM

## 2016-09-15 DIAGNOSIS — O26893 Other specified pregnancy related conditions, third trimester: Secondary | ICD-10-CM | POA: Insufficient documentation

## 2016-09-15 DIAGNOSIS — O10013 Pre-existing essential hypertension complicating pregnancy, third trimester: Secondary | ICD-10-CM | POA: Insufficient documentation

## 2016-09-15 DIAGNOSIS — Z7982 Long term (current) use of aspirin: Secondary | ICD-10-CM | POA: Insufficient documentation

## 2016-09-15 DIAGNOSIS — O10919 Unspecified pre-existing hypertension complicating pregnancy, unspecified trimester: Secondary | ICD-10-CM | POA: Diagnosis not present

## 2016-09-15 DIAGNOSIS — Z3493 Encounter for supervision of normal pregnancy, unspecified, third trimester: Secondary | ICD-10-CM

## 2016-09-15 DIAGNOSIS — O0990 Supervision of high risk pregnancy, unspecified, unspecified trimester: Secondary | ICD-10-CM

## 2016-09-15 DIAGNOSIS — E860 Dehydration: Secondary | ICD-10-CM | POA: Insufficient documentation

## 2016-09-15 LAB — COMPREHENSIVE METABOLIC PANEL
ALBUMIN: 3.1 g/dL — AB (ref 3.5–5.0)
ALK PHOS: 107 U/L (ref 38–126)
ALT: 11 U/L — AB (ref 14–54)
AST: 14 U/L — AB (ref 15–41)
Anion gap: 9 (ref 5–15)
BUN: 7 mg/dL (ref 6–20)
CHLORIDE: 107 mmol/L (ref 101–111)
CO2: 19 mmol/L — AB (ref 22–32)
Calcium: 8.9 mg/dL (ref 8.9–10.3)
Creatinine, Ser: 0.54 mg/dL (ref 0.44–1.00)
GFR calc non Af Amer: >60 mL/min (ref >60)
GLUCOSE: 98 mg/dL (ref 65–99)
Potassium: 3.5 mmol/L (ref 3.5–5.1)
SODIUM: 135 mmol/L (ref 135–145)
Total Bilirubin: 0.3 mg/dL (ref 0.3–1.2)
Total Protein: 6.6 g/dL (ref 6.5–8.1)

## 2016-09-15 LAB — URINE MICROSCOPIC-ADD ON

## 2016-09-15 LAB — URINALYSIS, ROUTINE W REFLEX MICROSCOPIC
BILIRUBIN URINE: NEGATIVE
Glucose, UA: NEGATIVE mg/dL
Hgb urine dipstick: NEGATIVE
KETONES UR: 40 mg/dL — AB
NITRITE: NEGATIVE
PROTEIN: 30 mg/dL — AB
Specific Gravity, Urine: 1.025 (ref 1.005–1.030)
pH: 6 (ref 5.0–8.0)

## 2016-09-15 LAB — CBC
HCT: 36.8 % (ref 36.0–46.0)
HEMOGLOBIN: 13.2 g/dL (ref 12.0–15.0)
MCH: 30.6 pg (ref 26.0–34.0)
MCHC: 35.9 g/dL (ref 30.0–36.0)
MCV: 85.2 fL (ref 78.0–100.0)
PLATELETS: 296 10*3/uL (ref 150–400)
RBC: 4.32 MIL/uL (ref 3.87–5.11)
RDW: 13 % (ref 11.5–15.5)
WBC: 11.2 10*3/uL — AB (ref 4.0–10.5)

## 2016-09-15 LAB — GLUCOSE, CAPILLARY: GLUCOSE-CAPILLARY: 99 mg/dL (ref 65–99)

## 2016-09-15 MED ORDER — LACTATED RINGERS IV BOLUS (SEPSIS)
1000.0000 mL | Freq: Once | INTRAVENOUS | Status: AC
  Administered 2016-09-15: 1000 mL via INTRAVENOUS

## 2016-09-15 NOTE — MAU Note (Signed)
States she had an episode where her husband states she was unresponsive for 10 secs. States after that she experienced a cold sweat and weakness. States it happened several times with first pregnancy, but this is first time with this pregnancy. States she had not eaten since last night, but that is not unusual. States she has been drinking water. Also C/O constant pressure in vagina and rectum for about 2 weeks.

## 2016-09-15 NOTE — MAU Provider Note (Signed)
History     CSN: [id number]  Arrival date and time: 09/15/16 1330   None     Chief Complaint  Patient presents with  . Dizziness  . Abdominal Cramping   G3P1011 @35 .2 weeks here with c/o short episode of syncope about 2 hrs ago. Before the episode she felt lightheaded. She was sitting and husband noted her to slump over. She did not fall. LOC occurred for ~10 seconds. She has not eaten anything since last night, she says she can never eat before noon without vomiting. She has had 1 glass of water today. She reports good FM. No VB, LOF, or ctx.     OB History    Gravida Para Term Preterm AB Living   3 1 1  0 1 1   SAB TAB Ectopic Multiple Live Births   1 0 0 0 1      Obstetric Comments   No rhogam with SAB in Nov 2016      Past Medical History:  Diagnosis Date  . ADHD (attention deficit hyperactivity disorder)    not on meds  . Pregnancy   . Seizures (HCC)    as a child, last sz 12 yrs ago    Past Surgical History:  Procedure Laterality Date  . NO PAST SURGERIES      Family History  Problem Relation Age of Onset  . Arthritis Father   . Cancer Maternal Grandmother   . Diabetes Maternal Grandmother   . Arthritis Mother   . Hypertension Mother     Social History  Substance Use Topics  . Smoking status: Never Smoker  . Smokeless tobacco: Never Used  . Alcohol use No    Allergies:  Allergies  Allergen Reactions  . 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   Reaction:  Seizures   . Prednisone Hives    Prescriptions Prior to Admission  Medication Sig Dispense Refill Last Dose  . aspirin EC 81 MG tablet Take 1 tablet (81 mg total) by mouth daily. 30 tablet 6 09/15/2016 at 1100  . pantoprazole (PROTONIX) 40 MG tablet Take 1 tablet (40 mg total) by mouth daily. 30 tablet 2 09/15/2016 at Unknown time  . Prenatal Vit-Fe Fumarate-FA (PRENATAL MULTIVITAMIN) TABS tablet Take 1 tablet by mouth daily.    09/15/2016 at Unknown time     ROS Physical Exam   Blood pressure 136/72, pulse 91, temperature 99.1 F (37.3 C), temperature source Oral, resp. rate 18, height 6\' 1"  (1.854 m), weight 117.9 kg (260 lb), last menstrual period 01/12/2016.  Physical Exam  Constitutional: She is oriented to person, place, and time. She appears well-developed and well-nourished. No distress.  HENT:  Head: Normocephalic and atraumatic.  Neck: Normal range of motion. Neck supple.  Cardiovascular: Normal rate and regular rhythm.   Respiratory: Effort normal and breath sounds normal.  GI: Soft. She exhibits no distension. There is no tenderness.  gravid  Genitourinary:  Genitourinary Comments: SVE: 3/40/-3, vtx (exam per pt request)  Musculoskeletal: Normal range of motion.  Neurological: She is alert and oriented to person, place, and time.  Skin: Skin is warm and dry.  Psychiatric: She has a normal mood and affect.   EFM: 140 bpm, mod variability, + accels, no decels Toco: none Results for orders placed or performed during the hospital encounter of 09/15/16 (from the past 24 hour(s))  Urinalysis, Routine w reflex microscopic (not at [facility])     Status: Abnormal  Collection Time: 09/15/16  1:38 PM  Result Value Ref Range   Color, Urine YELLOW YELLOW   APPearance HAZY (A) CLEAR   Specific Gravity, Urine 1.025 1.005 - 1.030   pH 6.0 5.0 - 8.0   Glucose, UA NEGATIVE NEGATIVE mg/dL   Hgb urine dipstick NEGATIVE NEGATIVE   Bilirubin Urine NEGATIVE NEGATIVE   Ketones, ur 40 (A) NEGATIVE mg/dL   Protein, ur 30 (A) NEGATIVE mg/dL   Nitrite NEGATIVE NEGATIVE   Leukocytes, UA MODERATE (A) NEGATIVE  Urine microscopic-add on     Status: Abnormal   Collection Time: 09/15/16  1:38 PM  Result Value Ref Range   Squamous Epithelial / LPF TOO NUMEROUS TO COUNT (A) NONE SEEN   WBC, UA TOO NUMEROUS TO COUNT 0 - 5 WBC/hpf   RBC / HPF 0-5 0 - 5 RBC/hpf   Bacteria, UA MANY (A) NONE SEEN   Urine-Other MUCOUS PRESENT   Glucose, capillary      Status: None   Collection Time: 09/15/16  2:39 PM  Result Value Ref Range   Glucose-Capillary 99 65 - 99 mg/dL  CBC     Status: Abnormal   Collection Time: 09/15/16  2:41 PM  Result Value Ref Range   WBC 11.2 (H) 4.0 - 10.5 K/uL   RBC 4.32 3.87 - 5.11 MIL/uL   Hemoglobin 13.2 12.0 - 15.0 g/dL   HCT 16.1 09.6 - 04.5 %   MCV 85.2 78.0 - 100.0 fL   MCH 30.6 26.0 - 34.0 pg   MCHC 35.9 30.0 - 36.0 g/dL   RDW 40.9 81.1 - 91.4 %   Platelets 296 150 - 400 K/uL  Comprehensive metabolic panel     Status: Abnormal   Collection Time: 09/15/16  2:41 PM  Result Value Ref Range   Sodium 135 135 - 145 mmol/L   Potassium 3.5 3.5 - 5.1 mmol/L   Chloride 107 101 - 111 mmol/L   CO2 19 (L) 22 - 32 mmol/L   Glucose, Bld 98 65 - 99 mg/dL   BUN 7 6 - 20 mg/dL   Creatinine, Ser 7.82 0.44 - 1.00 mg/dL   Calcium 8.9 8.9 - 95.6 mg/dL   Total Protein 6.6 6.5 - 8.1 g/dL   Albumin 3.1 (L) 3.5 - 5.0 g/dL   AST 14 (L) 15 - 41 U/L   ALT 11 (L) 14 - 54 U/L   Alkaline Phosphatase 107 38 - 126 U/L   Total Bilirubin 0.3 0.3 - 1.2 mg/dL   GFR calc non Af Amer >60 >60 mL/min   GFR calc Af Amer >60 >60 mL/min   Anion gap 9 5 - 15    MAU Course  Procedures  MDM Labs ordered and reviewed. EKG interpreted by Dr. [place]- benign. Syncope event likely d/t poor nutrition and dehydration. I recommend Unsiom 1/2 tablet at hs and Vitamin B6 50 mg po at hs, this may combat the nausea and allow her to eat and drink well in the am and frequently during the day to avoid hypoglycemia and dehydration. Stable for discharge home.    Assessment and Plan   1. Chronic hypertension during pregnancy, antepartum   2. Supervision of high risk pregnancy, antepartum   3. Vasovagal syncope   4. Dehydration   5. Third trimester pregnancy   6. NST (non-stress test) reactive    Discharge home Follow up as scheduled in office next week Eat frequently Maintain good hydration    Medication List    TAKE these  medications    aspirin EC 81 MG tablet Take 1 tablet (81 mg total) by mouth daily.   pantoprazole 40 MG tablet Commonly known as:  PROTONIX Take 1 tablet (40 mg total) by mouth daily.   prenatal multivitamin Tabs tablet Take 1 tablet by mouth daily.      Donette Larry, CNM 09/15/2016, 2:33 PM

## 2016-09-15 NOTE — Discharge Instructions (Signed)
Syncope °Syncope is a medical term for fainting or passing out. This means you lose consciousness and drop to the ground. People are generally unconscious for less than 5 minutes. You may have some muscle twitches for up to 15 seconds before waking up and returning to normal. Syncope occurs more often in older adults, but it can happen to anyone. While most causes of syncope are not dangerous, syncope can be a sign of a serious medical problem. It is important to seek medical care.  °CAUSES  °Syncope is caused by a sudden drop in blood flow to the brain. The specific cause is often not determined. Factors that can bring on syncope include: °· Taking medicines that lower blood pressure. °· Sudden changes in posture, such as standing up quickly. °· Taking more medicine than prescribed. °· Standing in one place for too long. °· Seizure disorders. °· Dehydration and excessive exposure to heat. °· Low blood sugar (hypoglycemia). °· Straining to have a bowel movement. °· Heart disease, irregular heartbeat, or other circulatory problems. °· Fear, emotional distress, seeing blood, or severe pain. °SYMPTOMS  °Right before fainting, you may: °· Feel dizzy or light-headed. °· Feel nauseous. °· See all white or all black in your field of vision. °· Have cold, clammy skin. °DIAGNOSIS  °Your health care provider will ask about your symptoms, perform a physical exam, and perform an electrocardiogram (ECG) to record the electrical activity of your heart. Your health care provider may also perform other heart or blood tests to determine the cause of your syncope which may include: °· Transthoracic echocardiogram (TTE). During echocardiography, sound waves are used to evaluate how blood flows through your heart. °· Transesophageal echocardiogram (TEE). °· Cardiac monitoring. This allows your health care provider to monitor your heart rate and rhythm in real time. °· Holter monitor. This is a portable device that records your  heartbeat and can help diagnose heart arrhythmias. It allows your health care provider to track your heart activity for several days, if needed. °· Stress tests by exercise or by giving medicine that makes the heart beat faster. °TREATMENT  °In most cases, no treatment is needed. Depending on the cause of your syncope, your health care provider may recommend changing or stopping some of your medicines. °HOME CARE INSTRUCTIONS °· Have someone stay with you until you feel stable. °· Do not drive, use machinery, or play sports until your health care provider says it is okay. °· Keep all follow-up appointments as directed by your health care provider. °· Lie down right away if you start feeling like you might faint. Breathe deeply and steadily. Wait until all the symptoms have passed. °· Drink enough fluids to keep your urine clear or pale yellow. °· If you are taking blood pressure or heart medicine, get up slowly and take several minutes to sit and then stand. This can reduce dizziness. °SEEK IMMEDIATE MEDICAL CARE IF:  °· You have a severe headache. °· You have unusual pain in the chest, abdomen, or back. °· You are bleeding from your mouth or rectum, or you have black or tarry stool. °· You have an irregular or very fast heartbeat. °· You have pain with breathing. °· You have repeated fainting or seizure-like jerking during an episode. °· You faint when sitting or lying down. °· You have confusion. °· You have trouble walking. °· You have severe weakness. °· You have vision problems. °If you fainted, call your local emergency services (911 in [place] Do not drive   yourself to the hospital.    This information is not intended to replace advice given to you by your health care provider. Make sure you discuss any questions you have with your health care provider.   Document Released: 11/27/2005 Document Revised: 04/13/2015 Document Reviewed: 01/26/2012 Elsevier Interactive Patient Education 2016 Elsevier  Inc. Dehydration, Adult Dehydration means your body does not have as much fluid or water as it needs. It happens when you take in less fluid than you lose. Your kidneys, brain, and heart will not work properly without the right amount of fluids.  Dehydration can range from mild to severe. It should be treated right away to help prevent it from becoming severe. HOME CARE  Drink enough fluid to keep your pee (urine) clear or pale yellow.  Drink water or fluid slowly by taking small sips. You can also try sucking on ice cubes.  Have food or drinks that contain electrolytes. Examples include bananas and sports drinks.  Take over-the-counter and prescription medicines only as told by your doctor.  Prepare oral rehydration solution (ORS) according to the instructions that came with it. Take sips of ORS every 5 minutes until your pee returns to normal.  If you are throwing up (vomiting) or have watery poop (diarrhea), keep trying to drink water, ORS, or both.  If you have watery poop, avoid:  Drinks with caffeine.  Fruit juice.  Milk.  Carbonated soft drinks.  Do not take salt tablets. This can lead to having too much sodium in your body (hypernatremia). GET HELP IF:  You cannot eat or drink without throwing up.  You have had mild watery poop for longer than 24 hours.  You have a fever. GET HELP RIGHT AWAY IF:   You have very strong thirst.  You have very bad watery poop.  You have not peed in 6-8 hours, or you have peed only a small amount of very dark pee.  You have shriveled skin.  You are dizzy, confused, or both.   This information is not intended to replace advice given to you by your health care provider. Make sure you discuss any questions you have with your health care provider.   Document Released: 09/23/2009 Document Revised: 08/18/2015 Document Reviewed: 04/14/2015 Elsevier Interactive Patient Education [employer]2016 Elsevier Inc.

## 2016-09-20 ENCOUNTER — Ambulatory Visit (INDEPENDENT_AMBULATORY_CARE_PROVIDER_SITE_OTHER): Payer: BLUE CROSS/BLUE SHIELD | Admitting: Advanced Practice Midwife

## 2016-09-20 ENCOUNTER — Inpatient Hospital Stay (HOSPITAL_COMMUNITY)
Admission: AD | Admit: 2016-09-20 | Discharge: 2016-09-20 | Disposition: A | Discharge status: Home / Self Care | Payer: BLUE CROSS/BLUE SHIELD | Source: Ambulatory Visit | Attending: Family Medicine | Admitting: Family Medicine

## 2016-09-20 ENCOUNTER — Encounter (HOSPITAL_COMMUNITY): Payer: Self-pay | Admitting: *Deleted

## 2016-09-20 ENCOUNTER — Ambulatory Visit (HOSPITAL_COMMUNITY): Admission: RE | Admit: 2016-09-20 | Payer: BLUE CROSS/BLUE SHIELD | Source: Ambulatory Visit

## 2016-09-20 ENCOUNTER — Inpatient Hospital Stay (HOSPITAL_COMMUNITY): Payer: BLUE CROSS/BLUE SHIELD

## 2016-09-20 VITALS — BP 151/84 | HR 103 | Wt 263.0 lb

## 2016-09-20 DIAGNOSIS — Z7982 Long term (current) use of aspirin: Secondary | ICD-10-CM | POA: Diagnosis not present

## 2016-09-20 DIAGNOSIS — R030 Elevated blood-pressure reading, without diagnosis of hypertension: Secondary | ICD-10-CM | POA: Diagnosis present

## 2016-09-20 DIAGNOSIS — O99213 Obesity complicating pregnancy, third trimester: Secondary | ICD-10-CM

## 2016-09-20 DIAGNOSIS — O10913 Unspecified pre-existing hypertension complicating pregnancy, third trimester: Secondary | ICD-10-CM | POA: Diagnosis not present

## 2016-09-20 DIAGNOSIS — O10013 Pre-existing essential hypertension complicating pregnancy, third trimester: Secondary | ICD-10-CM | POA: Insufficient documentation

## 2016-09-20 DIAGNOSIS — Z3689 Encounter for other specified antenatal screening: Secondary | ICD-10-CM

## 2016-09-20 DIAGNOSIS — Z113 Encounter for screening for infections with a predominantly sexual mode of transmission: Secondary | ICD-10-CM

## 2016-09-20 DIAGNOSIS — O26893 Other specified pregnancy related conditions, third trimester: Secondary | ICD-10-CM | POA: Diagnosis not present

## 2016-09-20 DIAGNOSIS — O0990 Supervision of high risk pregnancy, unspecified, unspecified trimester: Secondary | ICD-10-CM

## 2016-09-20 DIAGNOSIS — O10919 Unspecified pre-existing hypertension complicating pregnancy, unspecified trimester: Secondary | ICD-10-CM

## 2016-09-20 DIAGNOSIS — Z3A36 36 weeks gestation of pregnancy: Secondary | ICD-10-CM | POA: Insufficient documentation

## 2016-09-20 DIAGNOSIS — R51 Headache: Secondary | ICD-10-CM | POA: Diagnosis not present

## 2016-09-20 HISTORY — DX: Essential (primary) hypertension: I10

## 2016-09-20 LAB — POCT URINALYSIS DIP (DEVICE)
Bilirubin Urine: NEGATIVE
Glucose, UA: NEGATIVE mg/dL
Hgb urine dipstick: NEGATIVE
NITRITE: NEGATIVE
PH: 6 (ref 5.0–8.0)
PROTEIN: NEGATIVE mg/dL
Specific Gravity, Urine: 1.01 (ref 1.005–1.030)
Urobilinogen, UA: 0.2 mg/dL (ref 0.0–1.0)

## 2016-09-20 LAB — CBC
HCT: 37 % (ref 36.0–46.0)
Hemoglobin: 13.5 g/dL (ref 12.0–15.0)
MCH: 30.9 pg (ref 26.0–34.0)
MCHC: 36.5 g/dL — ABNORMAL HIGH (ref 30.0–36.0)
MCV: 84.7 fL (ref 78.0–100.0)
PLATELETS: 294 10*3/uL (ref 150–400)
RBC: 4.37 MIL/uL (ref 3.87–5.11)
RDW: 13.3 % (ref 11.5–15.5)
WBC: 11.5 10*3/uL — ABNORMAL HIGH (ref 4.0–10.5)

## 2016-09-20 LAB — COMPREHENSIVE METABOLIC PANEL
ALT: 11 U/L — ABNORMAL LOW (ref 14–54)
ANION GAP: 6 (ref 5–15)
AST: 16 U/L (ref 15–41)
Albumin: 3.1 g/dL — ABNORMAL LOW (ref 3.5–5.0)
Alkaline Phosphatase: 106 U/L (ref 38–126)
BUN: 7 mg/dL (ref 6–20)
CHLORIDE: 107 mmol/L (ref 101–111)
CO2: 19 mmol/L — AB (ref 22–32)
CREATININE: 0.49 mg/dL (ref 0.44–1.00)
Calcium: 8.8 mg/dL — ABNORMAL LOW (ref 8.9–10.3)
Glucose, Bld: 103 mg/dL — ABNORMAL HIGH (ref 65–99)
POTASSIUM: 3.6 mmol/L (ref 3.5–5.1)
SODIUM: 132 mmol/L — AB (ref 135–145)
Total Bilirubin: 0.6 mg/dL (ref 0.3–1.2)
Total Protein: 6.6 g/dL (ref 6.5–8.1)

## 2016-09-20 LAB — PROTEIN / CREATININE RATIO, URINE: CREATININE, URINE: 86 mg/dL

## 2016-09-20 LAB — OB RESULTS CONSOLE GBS: GBS: NEGATIVE

## 2016-09-20 MED ORDER — ACETAMINOPHEN 500 MG PO TABS
1000.0000 mg | ORAL_TABLET | Freq: Four times a day (QID) | ORAL | Status: DC | PRN
  Administered 2016-09-20: 1000 mg via ORAL
  Filled 2016-09-20: qty 2

## 2016-09-20 MED ORDER — BUTALBITAL-APAP-CAFFEINE 50-325-40 MG PO TABS: 1.0000 | ORAL_TABLET | Freq: Four times a day (QID) | ORAL | 0 refills | Status: DC | PRN

## 2016-09-20 MED ORDER — BUTALBITAL-APAP-CAFFEINE 50-325-40 MG PO TABS
2.0000 | ORAL_TABLET | Freq: Four times a day (QID) | ORAL | Status: DC | PRN
  Administered 2016-09-20: 2 via ORAL
  Filled 2016-09-20: qty 2

## 2016-09-20 NOTE — Progress Notes (Signed)
Pt has weekly NST & BPP scheduled due to inability to attend twice weekly fetal testing appts.

## 2016-09-20 NOTE — Progress Notes (Signed)
routine

## 2016-09-20 NOTE — MAU Note (Signed)
Pt sent to MAU from clinic for gestational HTN, has had HA for 5-6 days, denies visual changes, has upper abdominal pain that comes & goes.   Pt also states she has been having uc's since 1600 yesterday, denies bleeding or LOF.

## 2016-09-20 NOTE — Progress Notes (Signed)
   PRENATAL VISIT NOTE  Subjective:  Barbara Robertson is a 25 y.o. G3P1011 at 3050w0d being seen today for ongoing prenatal care.  She is currently monitored for the following issues for this high-risk pregnancy and has Rh negative status during pregnancy, antepartum; Obesity in pregnancy; Morbid obesity (HCC); Supervision of high risk pregnancy, antepartum; and Chronic hypertension during pregnancy, antepartum on her problem list.  Patient reports headache.  Contractions: Irregular. Vag. Bleeding: None.  Movement: (!) Decreased. Denies leaking of fluid.   The following portions of the patient's history were reviewed and updated as appropriate: allergies, current medications, past family history, past medical history, past social history, past surgical history and problem list. Problem list updated.  Objective:   Vitals:   09/20/16 1252 09/20/16 1301  BP: (!) 152/95 (!) 151/84  Pulse: (!) 103   Weight: 263 lb (119.3 kg)     Fetal Status: Fetal Heart Rate (bpm): NST   Movement: (!) Decreased     General:  Alert, oriented and cooperative. Patient is in no acute distress.  Skin: Skin is warm and dry. No rash noted.   Cardiovascular: Normal heart rate noted  Respiratory: Normal respiratory effort, no problems with respiration noted  Abdomen: Soft, gravid, appropriate for gestational age. Pain/Pressure: Present     Pelvic:  Cervical exam performed        Extremities: Normal range of motion.  Edema: None  Mental Status: Normal mood and affect. Normal behavior. Normal judgment and thought content.   Urinalysis: Urine Protein: Negative Urine Glucose: Negative  Assessment and Plan:  Pregnancy: G3P1011 at 5050w0d  1. Chronic hypertension during pregnancy     Elevated today.   Has had headache for 5 days, new to her.  No visual changes - Fetal nonstress test  2. Supervision of high risk pregnancy, antepartum     Cervix 3-4/80/-2/vertex     Has been contracting this week - Culture, beta strep  (group b only) - GC/Chlamydia probe amp ([place])not at [facility]  3. Chronic hypertension during pregnancy, antepartum      Sent to MAU for eval      NST reactive      Scheduled for BPP  Preterm labor symptoms and general obstetric precautions including but not limited to vaginal bleeding, contractions, leaking of fluid and fetal movement were reviewed in detail with the patient. Please refer to After Visit Summary for other counseling recommendations.  Return in about 1 week (around 09/27/2016) for as scheduled, [facility].  Aviva SignsMarie L [name], CNM

## 2016-09-20 NOTE — Patient Instructions (Signed)

## 2016-09-20 NOTE — Discharge Instructions (Signed)

## 2016-09-20 NOTE — MAU Provider Note (Signed)
History     CSN: [id number]  Arrival date and time: 09/20/16 1410   None     Chief Complaint  Patient presents with  . Hypertension  . Headache  . Contractions   G3P1011 @36  weeks sent from clinic for elevated BPs and HA. She has chronic HTN not on meds and BP more elevated today. She reports frontal and temporal HA x5 days. She has not tried Tylenol. She reports good FM. No LOF, VB, or regular painful ctx. She denies epigastric pain and visual disturbances.    OB History    Gravida Para Term Preterm AB Living   3 1 1  0 1 1   SAB TAB Ectopic Multiple Live Births   1 0 0 0 1      Obstetric Comments   No rhogam with SAB in Nov 2016      Past Medical History:  Diagnosis Date  . ADHD (attention deficit hyperactivity disorder)    not on meds  . Hypertension   . Pregnancy   . Seizures (HCC)    as a child, last sz 12 yrs ago    Past Surgical History:  Procedure Laterality Date  . NO PAST SURGERIES      Family History  Problem Relation Age of Onset  . Arthritis Father   . Cancer Maternal Grandmother   . Diabetes Maternal Grandmother   . Arthritis Mother   . Hypertension Mother     Social History  Substance Use Topics  . Smoking status: Never Smoker  . Smokeless tobacco: Never Used  . Alcohol use No    Allergies:  Allergies  Allergen Reactions  . 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   Reaction:  Seizures   . Prednisone Hives    Prescriptions Prior to Admission  Medication Sig Dispense Refill Last Dose  . aspirin EC 81 MG tablet Take 1 tablet (81 mg total) by mouth daily. 30 tablet 6 Taking  . pantoprazole (PROTONIX) 40 MG tablet Take 1 tablet (40 mg total) by mouth daily. 30 tablet 2 Taking  . Prenatal Vit-Fe Fumarate-FA (PRENATAL MULTIVITAMIN) TABS tablet Take 1 tablet by mouth daily.    Taking    Review of Systems  Constitutional: Negative.   Eyes: Negative.   Gastrointestinal: Negative.   Neurological:  Positive for headaches.   Physical Exam   Blood pressure 141/92, pulse 106, temperature 99.1 F (37.3 C), temperature source Oral, resp. rate 18, last menstrual period 01/12/2016. Vitals:   09/20/16 1430 09/20/16 1446 09/20/16 1501  BP: 138/85 141/92 136/81  Pulse: 91 106 103  Resp: 18    Temp: 99.1 F (37.3 C)    TempSrc: Oral      Physical Exam  Constitutional: She is oriented to person, place, and time. She appears well-developed and well-nourished.  HENT:  Head: Normocephalic and atraumatic.  Neck: Normal range of motion. Neck supple.  Cardiovascular: Normal rate and regular rhythm.   Respiratory: Effort normal and breath sounds normal.  GI: Soft. She exhibits no distension. There is no tenderness.  gravid  Musculoskeletal: Normal range of motion.  Neurological: She is alert and oriented to person, place, and time.  Skin: Skin is warm and dry.  Psychiatric: She has a normal mood and affect.   EFM: 155 bpm, mod variability, + accels, no decels Toco: none Results for orders placed or performed during the hospital encounter of 09/20/16 (from the past 24 hour(s))  Protein /  creatinine ratio, urine     Status: None   Collection Time: 09/20/16  2:15 PM  Result Value Ref Range   Creatinine, Urine 86.00 mg/dL   Total Protein, Urine <6 mg/dL   Protein Creatinine Ratio        0.00 - 0.15 mg/mg[Cre]  CBC     Status: Abnormal   Collection Time: 09/20/16  2:37 PM  Result Value Ref Range   WBC 11.5 (H) 4.0 - 10.5 K/uL   RBC 4.37 3.87 - 5.11 MIL/uL   Hemoglobin 13.5 12.0 - 15.0 g/dL   HCT 57.8 46.9 - 62.9 %   MCV 84.7 78.0 - 100.0 fL   MCH 30.9 26.0 - 34.0 pg   MCHC 36.5 (H) 30.0 - 36.0 g/dL   RDW 52.8 41.3 - 24.4 %   Platelets 294 150 - 400 K/uL  Comprehensive metabolic panel     Status: Abnormal   Collection Time: 09/20/16  2:37 PM  Result Value Ref Range   Sodium 132 (L) 135 - 145 mmol/L   Potassium 3.6 3.5 - 5.1 mmol/L   Chloride 107 101 - 111 mmol/L   CO2 19 (L) 22  - 32 mmol/L   Glucose, Bld 103 (H) 65 - 99 mg/dL   BUN 7 6 - 20 mg/dL   Creatinine, Ser 0.10 0.44 - 1.00 mg/dL   Calcium 8.8 (L) 8.9 - 10.3 mg/dL   Total Protein 6.6 6.5 - 8.1 g/dL   Albumin 3.1 (L) 3.5 - 5.0 g/dL   AST 16 15 - 41 U/L   ALT 11 (L) 14 - 54 U/L   Alkaline Phosphatase 106 38 - 126 U/L   Total Bilirubin 0.6 0.3 - 1.2 mg/dL   GFR calc non Af Amer >60 >60 mL/min   GFR calc Af Amer >60 >60 mL/min   Anion gap 6 5 - 15   BPP 8/8 MAU Course  Procedures Tylenol 1 gm po x1 Fioricet 2 tabs  MDM Labs and BPP ordered and reviewed. BPs normalized. HA resolved after Fioricet. No evidence of pre-e. Stable for discharge home.    Assessment and Plan   1. Chronic hypertension during pregnancy, antepartum   2. Supervision of high risk pregnancy, antepartum   3. Chronic hypertension affecting pregnancy   4. NST (non-stress test) reactive   5. Headache in pregnancy, antepartum, third trimester    Discharge home Pre-e precautions Follow up in [facility] in 1 week    Medication List    TAKE these medications   aspirin EC 81 MG tablet Take 1 tablet (81 mg total) by mouth daily.   butalbital-acetaminophen-caffeine 50-325-40 MG tablet Commonly known as:  FIORICET, ESGIC Take 1-2 tablets by mouth every 6 (six) hours as needed for headache.   pantoprazole 40 MG tablet Commonly known as:  PROTONIX Take 1 tablet (40 mg total) by mouth daily.   prenatal multivitamin Tabs tablet Take 1 tablet by mouth daily.       Donette Larry, CNM 09/20/2016, 2:52 PM

## 2016-09-20 NOTE — Progress Notes (Signed)
Mollyann taken to MAU for evaluation. Report called to charge nurse by Wynelle BourgeoisMarie Williams, CNM

## 2016-09-21 LAB — GC/CHLAMYDIA PROBE AMP (~~LOC~~) NOT AT ARMC
Chlamydia: NEGATIVE
NEISSERIA GONORRHEA: NEGATIVE

## 2016-09-22 LAB — CULTURE, BETA STREP (GROUP B ONLY)

## 2016-09-24 ENCOUNTER — Encounter (HOSPITAL_COMMUNITY): Payer: Self-pay | Admitting: *Deleted

## 2016-09-24 ENCOUNTER — Inpatient Hospital Stay (HOSPITAL_COMMUNITY)
Admission: AD | Admit: 2016-09-24 | Discharge: 2016-09-28 | DRG: 774 | Disposition: A | Discharge status: Home / Self Care | Payer: BLUE CROSS/BLUE SHIELD | Source: Ambulatory Visit | Attending: Obstetrics & Gynecology | Admitting: Obstetrics & Gynecology

## 2016-09-24 DIAGNOSIS — R51 Headache: Secondary | ICD-10-CM | POA: Diagnosis not present

## 2016-09-24 DIAGNOSIS — Z6832 Body mass index (BMI) 32.0-32.9, adult: Secondary | ICD-10-CM

## 2016-09-24 DIAGNOSIS — Z3A36 36 weeks gestation of pregnancy: Secondary | ICD-10-CM

## 2016-09-24 DIAGNOSIS — O119 Pre-existing hypertension with pre-eclampsia, unspecified trimester: Secondary | ICD-10-CM

## 2016-09-24 DIAGNOSIS — O10019 Pre-existing essential hypertension complicating pregnancy, unspecified trimester: Secondary | ICD-10-CM | POA: Diagnosis present

## 2016-09-24 DIAGNOSIS — Z6791 Unspecified blood type, Rh negative: Secondary | ICD-10-CM

## 2016-09-24 DIAGNOSIS — Z833 Family history of diabetes mellitus: Secondary | ICD-10-CM

## 2016-09-24 DIAGNOSIS — O09299 Supervision of pregnancy with other poor reproductive or obstetric history, unspecified trimester: Secondary | ICD-10-CM | POA: Diagnosis present

## 2016-09-24 DIAGNOSIS — O99214 Obesity complicating childbirth: Secondary | ICD-10-CM | POA: Diagnosis present

## 2016-09-24 DIAGNOSIS — O114 Pre-existing hypertension with pre-eclampsia, complicating childbirth: Principal | ICD-10-CM | POA: Diagnosis present

## 2016-09-24 DIAGNOSIS — R0989 Other specified symptoms and signs involving the circulatory and respiratory systems: Secondary | ICD-10-CM | POA: Diagnosis present

## 2016-09-24 DIAGNOSIS — O1002 Pre-existing essential hypertension complicating childbirth: Secondary | ICD-10-CM | POA: Diagnosis present

## 2016-09-24 DIAGNOSIS — O26893 Other specified pregnancy related conditions, third trimester: Secondary | ICD-10-CM | POA: Diagnosis present

## 2016-09-24 DIAGNOSIS — O0990 Supervision of high risk pregnancy, unspecified, unspecified trimester: Secondary | ICD-10-CM

## 2016-09-24 DIAGNOSIS — O1413 Severe pre-eclampsia, third trimester: Secondary | ICD-10-CM

## 2016-09-24 DIAGNOSIS — Z8249 Family history of ischemic heart disease and other diseases of the circulatory system: Secondary | ICD-10-CM

## 2016-09-24 LAB — TYPE AND SCREEN
ABO/RH(D): A NEG
ANTIBODY SCREEN: NEGATIVE

## 2016-09-24 LAB — CBC WITH DIFFERENTIAL/PLATELET
BASOS ABS: 0 10*3/uL (ref 0.0–0.1)
Basophils Relative: 0 %
EOS ABS: 0 10*3/uL (ref 0.0–0.7)
EOS PCT: 0 %
HCT: 37.8 % (ref 36.0–46.0)
Hemoglobin: 13.6 g/dL (ref 12.0–15.0)
LYMPHS PCT: 22 %
Lymphs Abs: 2.1 10*3/uL (ref 0.7–4.0)
MCH: 30.6 pg (ref 26.0–34.0)
MCHC: 36 g/dL (ref 30.0–36.0)
MCV: 84.9 fL (ref 78.0–100.0)
MONO ABS: 0.3 10*3/uL (ref 0.1–1.0)
Monocytes Relative: 3 %
Neutro Abs: 7.3 10*3/uL (ref 1.7–7.7)
Neutrophils Relative %: 75 %
PLATELETS: 297 10*3/uL (ref 150–400)
RBC: 4.45 MIL/uL (ref 3.87–5.11)
RDW: 13.2 % (ref 11.5–15.5)
WBC: 9.8 10*3/uL (ref 4.0–10.5)

## 2016-09-24 LAB — COMPREHENSIVE METABOLIC PANEL
ALBUMIN: 3.1 g/dL — AB (ref 3.5–5.0)
ALK PHOS: 116 U/L (ref 38–126)
ALT: 10 U/L — AB (ref 14–54)
AST: 14 U/L — AB (ref 15–41)
Anion gap: 9 (ref 5–15)
BILIRUBIN TOTAL: 0.3 mg/dL (ref 0.3–1.2)
BUN: 8 mg/dL (ref 6–20)
CO2: 19 mmol/L — ABNORMAL LOW (ref 22–32)
CREATININE: 0.71 mg/dL (ref 0.44–1.00)
Calcium: 9 mg/dL (ref 8.9–10.3)
Chloride: 108 mmol/L (ref 101–111)
GFR calc Af Amer: >60 mL/min (ref >60)
GFR calc non Af Amer: >60 mL/min (ref >60)
GLUCOSE: 87 mg/dL (ref 65–99)
POTASSIUM: 3.7 mmol/L (ref 3.5–5.1)
Sodium: 136 mmol/L (ref 135–145)
TOTAL PROTEIN: 6.7 g/dL (ref 6.5–8.1)

## 2016-09-24 LAB — PROTEIN / CREATININE RATIO, URINE
Creatinine, Urine: 296 mg/dL
PROTEIN CREATININE RATIO: 0.03 mg/mg{creat} (ref 0.00–0.15)
Total Protein, Urine: 9 mg/dL

## 2016-09-24 LAB — AMNISURE RUPTURE OF MEMBRANE (ROM) NOT AT ARMC: Amnisure ROM: NEGATIVE

## 2016-09-24 MED ORDER — CALCIUM CARBONATE ANTACID 500 MG PO CHEW: 2.0000 | CHEWABLE_TABLET | ORAL | Status: DC | PRN

## 2016-09-24 MED ORDER — ZOLPIDEM TARTRATE 5 MG PO TABS: 5.0000 mg | ORAL_TABLET | Freq: Every evening | ORAL | Status: DC | PRN

## 2016-09-24 MED ORDER — DOCUSATE SODIUM 100 MG PO CAPS
100.0000 mg | ORAL_CAPSULE | Freq: Every day | ORAL | Status: DC
  Filled 2016-09-24 (×4): qty 1

## 2016-09-24 MED ORDER — ACETAMINOPHEN 325 MG PO TABS: 650.0000 mg | ORAL_TABLET | ORAL | Status: DC | PRN

## 2016-09-24 MED ORDER — PRENATAL MULTIVITAMIN CH
1.0000 | ORAL_TABLET | Freq: Every day | ORAL | Status: DC
  Administered 2016-09-25 – 2016-09-26 (×2): 1 via ORAL
  Filled 2016-09-24 (×3): qty 1

## 2016-09-24 NOTE — MAU Provider Note (Signed)
History   Barbara Robertson is a 25 yo G3P1011 @ 36.4wks who presents with N/V, heartburn, leaking and constant headache x 2 days. Pt states she was her on the 11th and did not get any relief from the Fioricet or tylenol. Pt denies any VB. + FM.   CSN: [id number]  Arrival date & time 09/24/16  1436   None     Chief Complaint  Patient presents with  . Headache  . possible ROM    HPI  Past Medical History:  Diagnosis Date  . ADHD (attention deficit hyperactivity disorder)    not on meds  . Hypertension   . Pregnancy   . Seizures (HCC)    as a child, last sz 12 yrs ago    Past Surgical History:  Procedure Laterality Date  . NO PAST SURGERIES      Family History  Problem Relation Age of Onset  . Arthritis Father   . Cancer Maternal Grandmother   . Diabetes Maternal Grandmother   . Arthritis Mother   . Hypertension Mother     Social History  Substance Use Topics  . Smoking status: Never Smoker  . Smokeless tobacco: Never Used  . Alcohol use No    OB History    Gravida Para Term Preterm AB Living   3 1 1  0 1 1   SAB TAB Ectopic Multiple Live Births   1 0 0 0 1      Obstetric Comments   No rhogam with SAB in Nov 2016      Review of Systems  Constitutional: Negative.   HENT: Negative.   Eyes: Negative.   Respiratory: Negative.   Cardiovascular: Negative.   Gastrointestinal: Positive for nausea and vomiting.  Endocrine: Negative.   Genitourinary: Positive for vaginal discharge.  Musculoskeletal: Negative.   Skin: Negative.   Allergic/Immunologic: Negative.   Neurological: Positive for dizziness and light-headedness.  Hematological: Negative.   Psychiatric/Behavioral: Negative.     Allergies  Food and Prednisone  Home Medications    BP 147/88   Pulse 96   Temp 98.6 F (37 C) (Oral)   Resp 18   Wt 264 lb 4 oz (119.9 kg)   LMP 01/12/2016 (Exact Date)   BMI 34.86 kg/m   Physical Exam  Constitutional: She is oriented to person, place, and time.  She appears well-developed and well-nourished.  HENT:  Head: Normocephalic and atraumatic.  Eyes: Conjunctivae are normal. Pupils are equal, round, and reactive to light.  Neck: Normal range of motion. Neck supple.  Cardiovascular: Normal rate and regular rhythm.   Pulmonary/Chest: Effort normal and breath sounds normal.  Abdominal: Soft. Bowel sounds are normal.  Genitourinary: Vagina normal and uterus normal.  Musculoskeletal: Normal range of motion.  Neurological: She is alert and oriented to person, place, and time. She has normal reflexes.  Skin: Skin is warm and dry.  Psychiatric: She has a normal mood and affect. Her behavior is normal. Judgment and thought content normal.    MAU Course  Procedures (including critical care time)  Labs Reviewed  COMPREHENSIVE METABOLIC PANEL  CBC WITH DIFFERENTIAL/PLATELET  PROTEIN / CREATININE RATIO, URINE  AMNISURE RUPTURE OF MEMBRANE (ROM) NOT AT [facility]   No results found.   1. Supervision of high risk pregnancy, antepartum       MDM  1. IUP @ 36.4 wks  2. CHTN  3. Headache in preg 4. NST reactive   Plan: Will obtain Amnisure, CBC, CMP, UA, PC ratio; SVE 5/80/-2; will  consult Dr. Jolayne Pantheronstant on POC

## 2016-09-24 NOTE — MAU Note (Signed)
Thinks her water broke this morning.  Clear fluid, still leaking.  Had a headache for 2 wks. Hasn't taken anything for it, was here for [facility] eval this past week.   HA gotten worse since then. (4/80 at last visit).  Having contstant RUQ pain. And is having some irregular contractions.

## 2016-09-24 NOTE — MAU Note (Signed)
Pt. States she has had a headache for the past 3 days and has taken tylenol in the past without relief. Pt. States she has been leaking clear fluid around 0930. Baby movement has decreased but still feels movement at night and in am before getting up. Denies vaginal bleeding or other discharged. Next OB appointment is Wednesday. Pt. States she has irregular contractions and a constant upper abdominal pain. Pt. Was here on the Oct 11 and had BP work up at that time. Here for evaluation of symptoms.

## 2016-09-24 NOTE — H&P (Signed)
Barbara Robertson is a 25 yo G3P1011 @ 36.4wks who presents with N/V, heartburn, leaking and constant headache x 2 days. Pt states she was her on the 11th and did not get any relief from the Fioricet or tylenol. Pt denies any VB. + FM.   CSN: [id number]  Arrival date & time 09/24/16  1436   None     Chief Complaint  Patient presents with  . Headache  . possible ROM    HPI  Past Medical History:  Diagnosis Date  . ADHD (attention deficit hyperactivity disorder)    not on meds  . Hypertension   . Pregnancy   . Seizures (HCC)    as a child, last sz 12 yrs ago    Past Surgical History:  Procedure Laterality Date  . NO PAST SURGERIES      Family History  Problem Relation Age of Onset  . Arthritis Father   . Cancer Maternal Grandmother   . Diabetes Maternal Grandmother   . Arthritis Mother   . Hypertension Mother     Social History  Substance Use Topics  . Smoking status: Never Smoker  . Smokeless tobacco: Never Used  . Alcohol use No    OB History    Gravida Para Term Preterm AB Living   3 1 1  0 1 1   SAB TAB Ectopic Multiple Live Births   1 0 0 0 1      Obstetric Comments   No rhogam with SAB in Nov 2016      Review of Systems  Constitutional: Negative.   HENT: Negative.   Eyes: Negative.   Respiratory: Negative.   Cardiovascular: Negative.   Gastrointestinal: Positive for nausea and vomiting.  Endocrine: Negative.   Genitourinary: Positive for vaginal discharge.  Musculoskeletal: Negative.   Skin: Negative.   Allergic/Immunologic: Negative.   Neurological: Positive for dizziness and light-headedness.  Hematological: Negative.   Psychiatric/Behavioral: Negative.     Allergies  Food and Prednisone  Home Medications    BP 147/88   Pulse 96   Temp 98.6 F (37 C) (Oral)   Resp 18   Wt 264 lb 4 oz (119.9 kg)   LMP 01/12/2016 (Exact Date)   BMI 34.86 kg/m   Physical Exam  Constitutional: She is oriented to person, place, and time. She  appears well-developed and well-nourished.  HENT:  Head: Normocephalic and atraumatic.  Eyes: Conjunctivae are normal. Pupils are equal, round, and reactive to light.  Neck: Normal range of motion. Neck supple.  Cardiovascular: Normal rate and regular rhythm.   Pulmonary/Chest: Effort normal and breath sounds normal.  Abdominal: Soft. Bowel sounds are normal.  Genitourinary: Vagina normal and uterus normal.  Musculoskeletal: Normal range of motion.  Neurological: She is alert and oriented to person, place, and time. She has normal reflexes.  Skin: Skin is warm and dry.  Psychiatric: She has a normal mood and affect. Her behavior is normal. Judgment and thought content normal.    MAU Course  Procedures (including critical care time)  Labs Reviewed  COMPREHENSIVE METABOLIC PANEL  CBC WITH DIFFERENTIAL/PLATELET  PROTEIN / CREATININE RATIO, URINE  AMNISURE RUPTURE OF MEMBRANE (ROM) NOT AT [facility]   No results found.   1. Supervision of high risk pregnancy, antepartum       MDM  1. IUP @ 36.4 wks  2. CHTN  3. Headache in preg 4. NST reactive   Plan: Will obtain Amnisure, CBC, CMP, UA, PC ratio; SVE 5/80/-2; will consult Dr. Jolayne Pantheronstant  on POC. Pt to be admitted for observation and 24 hr urine.

## 2016-09-25 ENCOUNTER — Encounter (HOSPITAL_COMMUNITY): Payer: Self-pay | Admitting: Anesthesiology

## 2016-09-25 LAB — PROTEIN, URINE, 24 HOUR
COLLECTION INTERVAL-UPROT: 24 h
PROTEIN, 24H URINE: 90 mg/d (ref 50–100)
PROTEIN, URINE: 9 mg/dL
Urine Total Volume-UPROT: 1000 mL

## 2016-09-25 NOTE — Progress Notes (Signed)
Patient complains of blurred vision and seeing spots, as well as epigastric discomfort. Examined by both Florence CannerKristina M, RN and Valda LambJineane B, RN. Will continue to monitor patient at this time. Milon DikesKristina D Mazie Fencl, RN

## 2016-09-25 NOTE — Progress Notes (Signed)
Patient ID: Barbara Robertson, female   DOB: June 13, 1991, 25 y.o.   MRN: [id number]  FACULTY PRACTICE ANTEPARTUM(COMPREHENSIVE) NOTE  Barbara Robertson is a 25 y.o. G3P1011 at [redacted]w[redacted]d  who is admitted for observation.    Fetal presentation is cephalic. Length of Stay:  0  Days  Date of admission:09/24/2016  Subjective: Patient was found sleeping comfortably in bed. She denies headaches, visual disturbances, RUQ/epigastric pain, nausea or emesis Patient reports the fetal movement as active. Patient reports uterine contraction  activity as none. Patient reports  vaginal bleeding as none. Patient describes fluid per vagina as None.  Vitals:  Blood pressure 123/81, pulse (!) 101, temperature 98.5 F (36.9 C), temperature source Oral, resp. rate 18, height 6\' 1"  (1.854 m), weight 119.7 kg (264 lb), last menstrual period 01/12/2016, SpO2 98 %. Vitals:   09/24/16 2006 09/24/16 2338 09/25/16 0050 09/25/16 0058  BP: 139/87 130/79 (!) 143/89 123/81  Pulse: 94 84 96 (!) 101  Resp: 16 16 18    Temp: 98.9 F (37.2 C) 98.5 F (36.9 C)    TempSrc: Oral Oral    SpO2: 98%     Weight:      Height:       Physical Examination:  General appearance - alert, well appearing, and in no distress Fundal Height:  size equals dates Pelvic Exam:  examination not indicated Cervical Exam: Not evaluated.  Extremities: extremities normal, atraumatic, no cyanosis or edema with DTRs 2+ bilaterally Membranes:intact  Fetal Monitoring:  Baseline: 140 bpm, Variability: Good {> 6 bpm), Accelerations: Reactive, Decelerations: Absent and contractions now irregular, initally 3-4 minutes   reactive  Labs:  Results for orders placed or performed during the hospital encounter of 09/24/16 (from the past 24 hour(s))  Protein / creatinine ratio, urine   Collection Time: 09/24/16  3:20 PM  Result Value Ref Range   Creatinine, Urine 296.00 mg/dL   Total Protein, Urine 9 mg/dL   Protein Creatinine Ratio 0.03 0.00 - 0.15 mg/mg[Cre]   Amnisure rupture of membrane (rom)not at [facility]   Collection Time: 09/24/16  3:48 PM  Result Value Ref Range   Amnisure ROM NEGATIVE   Comprehensive metabolic panel   Collection Time: 09/24/16  4:19 PM  Result Value Ref Range   Sodium 136 135 - 145 mmol/L   Potassium 3.7 3.5 - 5.1 mmol/L   Chloride 108 101 - 111 mmol/L   CO2 19 (L) 22 - 32 mmol/L   Glucose, Bld 87 65 - 99 mg/dL   BUN 8 6 - 20 mg/dL   Creatinine, Ser 4.09 0.44 - 1.00 mg/dL   Calcium 9.0 8.9 - 81.1 mg/dL   Total Protein 6.7 6.5 - 8.1 g/dL   Albumin 3.1 (L) 3.5 - 5.0 g/dL   AST 14 (L) 15 - 41 U/L   ALT 10 (L) 14 - 54 U/L   Alkaline Phosphatase 116 38 - 126 U/L   Total Bilirubin 0.3 0.3 - 1.2 mg/dL   GFR calc non Af Amer >60 >60 mL/min   GFR calc Af Amer >60 >60 mL/min   Anion gap 9 5 - 15  CBC with Differential/Platelet   Collection Time: 09/24/16  4:19 PM  Result Value Ref Range   WBC 9.8 4.0 - 10.5 K/uL   RBC 4.45 3.87 - 5.11 MIL/uL   Hemoglobin 13.6 12.0 - 15.0 g/dL   HCT 91.4 78.2 - 95.6 %   MCV 84.9 78.0 - 100.0 fL   MCH 30.6 26.0 - 34.0 pg  MCHC 36.0 30.0 - 36.0 g/dL   RDW 16.113.2 09.611.5 - 04.515.5 %   Platelets 297 150 - 400 K/uL   Neutrophils Relative % 75 %   Neutro Abs 7.3 1.7 - 7.7 K/uL   Lymphocytes Relative 22 %   Lymphs Abs 2.1 0.7 - 4.0 K/uL   Monocytes Relative 3 %   Monocytes Absolute 0.3 0.1 - 1.0 K/uL   Eosinophils Relative 0 %   Eosinophils Absolute 0.0 0.0 - 0.7 K/uL   Basophils Relative 0 %   Basophils Absolute 0.0 0.0 - 0.1 K/uL  Type and screen [facility] [place]   Collection Time: 09/24/16  5:23 PM  Result Value Ref Range   ABO/RH(D) A NEG    Antibody Screen NEG    Sample Expiration 09/27/2016     Imaging Studies:      Medications:  Scheduled . docusate sodium  100 mg Oral Daily  . prenatal multivitamin  1 tablet Oral Q1200   I have reviewed the patient's current medications.  ASSESSMENT: [id number] 3638w5d Estimated Date of Delivery: 10/18/16  Patient Active  Problem List   Diagnosis Date Noted  . Gestational hypertension 09/24/2016  . Chronic hypertension during pregnancy, antepartum 06/26/2016  . Supervision of high risk pregnancy, antepartum 03/27/2016  . Morbid obesity (HCC) 08/05/2014  . Obesity in pregnancy 09/23/2013  . Rh negative status during pregnancy, antepartum 07/07/2013    PLAN: Continue monitoring BP Follow up 24 hour urine protein Continue monitoring for signs of labor Continue current care  Ilay Capshaw 09/25/2016,6:25 AM

## 2016-09-26 ENCOUNTER — Encounter (HOSPITAL_COMMUNITY): Payer: Self-pay

## 2016-09-26 DIAGNOSIS — O114 Pre-existing hypertension with pre-eclampsia, complicating childbirth: Secondary | ICD-10-CM | POA: Diagnosis present

## 2016-09-26 DIAGNOSIS — O99214 Obesity complicating childbirth: Secondary | ICD-10-CM | POA: Diagnosis present

## 2016-09-26 DIAGNOSIS — R51 Headache: Secondary | ICD-10-CM | POA: Diagnosis present

## 2016-09-26 DIAGNOSIS — Z833 Family history of diabetes mellitus: Secondary | ICD-10-CM | POA: Diagnosis not present

## 2016-09-26 DIAGNOSIS — Z6791 Unspecified blood type, Rh negative: Secondary | ICD-10-CM | POA: Diagnosis not present

## 2016-09-26 DIAGNOSIS — Z6832 Body mass index (BMI) 32.0-32.9, adult: Secondary | ICD-10-CM | POA: Diagnosis not present

## 2016-09-26 DIAGNOSIS — O1414 Severe pre-eclampsia complicating childbirth: Secondary | ICD-10-CM | POA: Diagnosis not present

## 2016-09-26 DIAGNOSIS — O1002 Pre-existing essential hypertension complicating childbirth: Secondary | ICD-10-CM | POA: Diagnosis present

## 2016-09-26 DIAGNOSIS — Z3A36 36 weeks gestation of pregnancy: Secondary | ICD-10-CM | POA: Diagnosis not present

## 2016-09-26 DIAGNOSIS — O26893 Other specified pregnancy related conditions, third trimester: Secondary | ICD-10-CM | POA: Diagnosis present

## 2016-09-26 DIAGNOSIS — O1490 Unspecified pre-eclampsia, unspecified trimester: Secondary | ICD-10-CM

## 2016-09-26 DIAGNOSIS — I10 Essential (primary) hypertension: Secondary | ICD-10-CM

## 2016-09-26 DIAGNOSIS — Z8249 Family history of ischemic heart disease and other diseases of the circulatory system: Secondary | ICD-10-CM | POA: Diagnosis not present

## 2016-09-26 LAB — COMPREHENSIVE METABOLIC PANEL
ALK PHOS: 117 U/L (ref 38–126)
ALT: 10 U/L — AB (ref 14–54)
AST: 15 U/L (ref 15–41)
Albumin: 2.9 g/dL — ABNORMAL LOW (ref 3.5–5.0)
Anion gap: 10 (ref 5–15)
BUN: 7 mg/dL (ref 6–20)
CALCIUM: 8.9 mg/dL (ref 8.9–10.3)
CHLORIDE: 108 mmol/L (ref 101–111)
CO2: 19 mmol/L — AB (ref 22–32)
CREATININE: 0.53 mg/dL (ref 0.44–1.00)
GFR calc non Af Amer: >60 mL/min (ref >60)
GLUCOSE: 109 mg/dL — AB (ref 65–99)
Potassium: 3.6 mmol/L (ref 3.5–5.1)
SODIUM: 137 mmol/L (ref 135–145)
Total Bilirubin: 0.5 mg/dL (ref 0.3–1.2)
Total Protein: 6.1 g/dL — ABNORMAL LOW (ref 6.5–8.1)

## 2016-09-26 LAB — CBC
HEMATOCRIT: 37.9 % (ref 36.0–46.0)
Hemoglobin: 13.1 g/dL (ref 12.0–15.0)
MCH: 29.9 pg (ref 26.0–34.0)
MCHC: 34.6 g/dL (ref 30.0–36.0)
MCV: 86.5 fL (ref 78.0–100.0)
Platelets: 267 10*3/uL (ref 150–400)
RBC: 4.38 MIL/uL (ref 3.87–5.11)
RDW: 13.6 % (ref 11.5–15.5)
WBC: 9.1 10*3/uL (ref 4.0–10.5)

## 2016-09-26 MED ORDER — SENNOSIDES-DOCUSATE SODIUM 8.6-50 MG PO TABS
2.0000 | ORAL_TABLET | ORAL | Status: DC
  Filled 2016-09-26: qty 2

## 2016-09-26 MED ORDER — OXYTOCIN 40 UNITS IN LACTATED RINGERS INFUSION - SIMPLE MED
1.0000 m[IU]/min | INTRAVENOUS | Status: DC
  Administered 2016-09-26: 2 m[IU]/min via INTRAVENOUS
  Administered 2016-09-26: 4 m[IU]/min via INTRAVENOUS

## 2016-09-26 MED ORDER — DOCUSATE SODIUM 100 MG PO CAPS: 100.0000 mg | ORAL_CAPSULE | Freq: Every day | ORAL | Status: DC

## 2016-09-26 MED ORDER — FENTANYL CITRATE (PF) 100 MCG/2ML IJ SOLN
100.0000 ug | INTRAMUSCULAR | Status: DC | PRN
  Administered 2016-09-26 (×2): 100 ug via INTRAVENOUS
  Filled 2016-09-26 (×2): qty 2

## 2016-09-26 MED ORDER — FLEET ENEMA 7-19 GM/118ML RE ENEM: 1.0000 | ENEMA | Freq: Every day | RECTAL | Status: DC | PRN

## 2016-09-26 MED ORDER — DIPHENHYDRAMINE HCL 25 MG PO CAPS: 25.0000 mg | ORAL_CAPSULE | Freq: Four times a day (QID) | ORAL | Status: DC | PRN

## 2016-09-26 MED ORDER — OXYTOCIN 40 UNITS IN LACTATED RINGERS INFUSION - SIMPLE MED
2.5000 [IU]/h | INTRAVENOUS | Status: DC
  Administered 2016-09-26: 2.5 [IU]/h via INTRAVENOUS
  Filled 2016-09-26: qty 1000

## 2016-09-26 MED ORDER — SOD CITRATE-CITRIC ACID 500-334 MG/5ML PO SOLN: 30.0000 mL | ORAL | Status: DC | PRN

## 2016-09-26 MED ORDER — OXYTOCIN BOLUS FROM INFUSION
500.0000 mL | Freq: Once | INTRAVENOUS | Status: AC
  Administered 2016-09-26: 500 mL via INTRAVENOUS

## 2016-09-26 MED ORDER — MAGNESIUM SULFATE BOLUS VIA INFUSION
6.0000 g | Freq: Once | INTRAVENOUS | Status: AC
  Administered 2016-09-26: 6 g via INTRAVENOUS
  Filled 2016-09-26: qty 500

## 2016-09-26 MED ORDER — MAGNESIUM SULFATE 50 % IJ SOLN
2.0000 g/h | INTRAMUSCULAR | Status: AC
  Administered 2016-09-26 – 2016-09-27 (×2): 2 g/h via INTRAVENOUS
  Filled 2016-09-26 (×2): qty 80

## 2016-09-26 MED ORDER — LIDOCAINE HCL (PF) 1 % IJ SOLN
30.0000 mL | INTRAMUSCULAR | Status: DC | PRN
  Filled 2016-09-26: qty 30

## 2016-09-26 MED ORDER — ONDANSETRON HCL 4 MG PO TABS: 4.0000 mg | ORAL_TABLET | ORAL | Status: DC | PRN

## 2016-09-26 MED ORDER — LACTATED RINGERS IV SOLN: 500.0000 mL | INTRAVENOUS | Status: DC | PRN

## 2016-09-26 MED ORDER — MISOPROSTOL 200 MCG PO TABS
600.0000 ug | ORAL_TABLET | Freq: Once | ORAL | Status: AC
  Administered 2016-09-26: 600 ug via ORAL

## 2016-09-26 MED ORDER — ACETAMINOPHEN 325 MG PO TABS: 650.0000 mg | ORAL_TABLET | ORAL | Status: DC | PRN

## 2016-09-26 MED ORDER — LACTATED RINGERS IV SOLN
INTRAVENOUS | Status: DC
  Administered 2016-09-26 – 2016-09-27 (×3): via INTRAVENOUS

## 2016-09-26 MED ORDER — ACETAMINOPHEN 325 MG PO TABS
650.0000 mg | ORAL_TABLET | ORAL | Status: DC | PRN
  Administered 2016-09-27: 650 mg via ORAL
  Filled 2016-09-26: qty 2

## 2016-09-26 MED ORDER — ONDANSETRON HCL 4 MG/2ML IJ SOLN
4.0000 mg | INTRAMUSCULAR | Status: DC | PRN
Start: 2016-09-26 — End: 2016-09-28

## 2016-09-26 MED ORDER — OXYCODONE-ACETAMINOPHEN 5-325 MG PO TABS
1.0000 | ORAL_TABLET | ORAL | Status: DC | PRN
  Administered 2016-09-26 – 2016-09-27 (×5): 1 via ORAL
  Filled 2016-09-26 (×4): qty 1

## 2016-09-26 MED ORDER — TETANUS-DIPHTH-ACELL PERTUSSIS 5-2.5-18.5 LF-MCG/0.5 IM SUSP: 0.5000 mL | Freq: Once | INTRAMUSCULAR | Status: DC

## 2016-09-26 MED ORDER — WITCH HAZEL-GLYCERIN EX PADS: 1.0000 "application " | MEDICATED_PAD | CUTANEOUS | Status: DC | PRN

## 2016-09-26 MED ORDER — FENTANYL CITRATE (PF) 100 MCG/2ML IJ SOLN: 50.0000 ug | INTRAMUSCULAR | Status: DC | PRN

## 2016-09-26 MED ORDER — DIBUCAINE 1 % RE OINT: 1.0000 "application " | TOPICAL_OINTMENT | RECTAL | Status: DC | PRN

## 2016-09-26 MED ORDER — MISOPROSTOL 200 MCG PO TABS
ORAL_TABLET | ORAL | Status: AC
  Administered 2016-09-26: 600 ug via ORAL
  Filled 2016-09-26: qty 3

## 2016-09-26 MED ORDER — LABETALOL HCL 5 MG/ML IV SOLN: 20.0000 mg | INTRAVENOUS | Status: DC | PRN

## 2016-09-26 MED ORDER — ONDANSETRON HCL 4 MG/2ML IJ SOLN: 4.0000 mg | Freq: Four times a day (QID) | INTRAMUSCULAR | Status: DC | PRN

## 2016-09-26 MED ORDER — COCONUT OIL OIL: 1.0000 "application " | TOPICAL_OIL | Status: DC | PRN

## 2016-09-26 MED ORDER — TERBUTALINE SULFATE 1 MG/ML IJ SOLN: 0.2500 mg | Freq: Once | INTRAMUSCULAR | Status: DC | PRN

## 2016-09-26 MED ORDER — SIMETHICONE 80 MG PO CHEW: 80.0000 mg | CHEWABLE_TABLET | ORAL | Status: DC | PRN

## 2016-09-26 MED ORDER — OXYCODONE-ACETAMINOPHEN 5-325 MG PO TABS
2.0000 | ORAL_TABLET | ORAL | Status: DC | PRN
Start: 2016-09-26 — End: 2016-09-28
  Filled 2016-09-26: qty 2

## 2016-09-26 MED ORDER — HYDRALAZINE HCL 20 MG/ML IJ SOLN: 5.0000 mg | INTRAMUSCULAR | Status: DC | PRN

## 2016-09-26 MED ORDER — BENZOCAINE-MENTHOL 20-0.5 % EX AERO: 1.0000 "application " | INHALATION_SPRAY | CUTANEOUS | Status: DC | PRN

## 2016-09-26 MED ORDER — PRENATAL MULTIVITAMIN CH
1.0000 | ORAL_TABLET | Freq: Every day | ORAL | Status: DC
  Administered 2016-09-27 – 2016-09-28 (×2): 1 via ORAL
  Filled 2016-09-26 (×2): qty 1

## 2016-09-26 MED ORDER — HYDROXYZINE HCL 50 MG PO TABS
50.0000 mg | ORAL_TABLET | Freq: Four times a day (QID) | ORAL | Status: DC | PRN
Start: 2016-09-26 — End: 2016-09-28
  Filled 2016-09-26: qty 1

## 2016-09-26 MED ORDER — PRENATAL MULTIVITAMIN CH: 1.0000 | ORAL_TABLET | Freq: Every day | ORAL | Status: DC

## 2016-09-26 MED ORDER — ZOLPIDEM TARTRATE 5 MG PO TABS: 5.0000 mg | ORAL_TABLET | Freq: Every evening | ORAL | Status: DC | PRN

## 2016-09-26 MED ORDER — CALCIUM CARBONATE ANTACID 500 MG PO CHEW: 2.0000 | CHEWABLE_TABLET | ORAL | Status: DC | PRN

## 2016-09-26 NOTE — Progress Notes (Signed)
Patient comfortable in bed with pitocin infusing at 4 mu and mag at 2 gram/hour. FHR is Cat 1. Blood pressures are controlled with magnesium. SVE 5/70/-2; AROM with clear fluid. Continue pitocin augmentation and continue to monitor blood pressures. CMP, CBC pending, patient has not voided yet for UPC. Anticipate NSVD.

## 2016-09-26 NOTE — Anesthesia Pain Management Evaluation Note (Signed)
  CRNA Pain Management Visit Note  Patient: Barbara ShanksMindy B Robertson, 25 y.o., female  "Hello I am a [facility] of the [facility]. We have an anesthesia team available at all times to provide care throughout the hospital, including epidural management and anesthesia for C-section. I don't know your plan for the delivery whether it a natural birth, water birth, IV sedation, nitrous supplementation, doula or epidural, but we want to meet your pain goals."   1.Was your pain managed to your expectations on prior hospitalizations?   Yes   2.What is your expectation for pain management during this hospitalization?     Labor support without medications  3.How can we help you reach that goal? Nursing comfort measures  Record the patient's initial score and the patient's pain goal.   Pain: 5  Pain Goal: 0-10 [facility] wants you to be able to say your pain was always managed very well.  [facility],Barbara Robertson 09/26/2016

## 2016-09-26 NOTE — Progress Notes (Signed)
Patient ID: Barbara Robertson, female   DOB: 02/05/1991, 25 y.o.   MRN: [id number]  [place]) NOTE  MALEEYAH Robertson is a 25 y.o. G3P1011 at [redacted]w[redacted]d who is admitted for observation.    Fetal presentation is cephalic.  Date of admission:09/24/2016  Subjective: Patient reports having severe headache, not alleviated by oral analgesia. Worse when she moves or talks.  She denies visual disturbances, RUQ/epigastric pain, nausea or emesis Patient reports the fetal movement as active. Patient reports uterine contraction activity as none. Patient reports  vaginal bleeding as none. Patient describes fluid per vagina as none.  Vitals:  Blood pressure 131/79, pulse 86, temperature 98 F (36.7 C), temperature source Oral, resp. rate 16, height _0  (1.854 m), weight 264 lb (119.7 kg), last menstrual period 01/12/2016, SpO2 98 %. Vitals:   09/25/16 1558 09/25/16 1635 09/25/16 2338 09/26/16 0909  BP: (!) 140/94 (!) 140/99  131/79  Pulse: 95 90  86  Resp: _1 Temp: 98.4 F (36.9 C) 98.5 F (36.9 C) 98.8 F (37.1 C) 98 F (36.7 C)  TempSrc: Oral Oral Oral Oral  SpO2:      Weight:      Height:       Physical Examination: General appearance:  alert, well appearing, and in no distress Fundal Height:  size equals dates Pelvic Exam:  examination not indicated Cervical Exam: 5/50/-2/vertex  Extremities: extremities normal, atraumatic, no cyanosis or edema with DTRs 2+ bilaterally, no clonus Membranes:intact  Fetal Monitoring:  Baseline: 140 bpm, Variability: Good {> 6 bpm), Accelerations: Reactive, Decelerations: Absent and contractions now irregular, initally 3-4 minutes   Reactive  Labs:   Results for orders placed or performed during the hospital encounter of 09/24/16 (from the past 72 hour(s))  Protein / creatinine ratio, urine     Status: None   Collection Time: 09/24/16  3:20 PM  Result Value Ref Range   Creatinine, Urine 296.00 mg/dL   Total  Protein, Urine 9 mg/dL    Comment: NO NORMAL RANGE ESTABLISHED FOR THIS TEST   Protein Creatinine Ratio 0.03 0.00 - 0.15 mg/mg[Cre]  [facility]    Status: None   Collection Time: 09/24/16  3:48 PM  Result Value Ref Range   Amnisure ROM NEGATIVE   Comprehensive metabolic panel     Status: Abnormal   Collection Time: 09/24/16  4:19 PM  Result Value Ref Range   Sodium 136 135 - 145 mmol/L   Potassium 3.7 3.5 - 5.1 mmol/L   Chloride 108 101 - 111 mmol/L   CO2 19 (L) 22 - 32 mmol/L   Glucose, Bld 87 65 - 99 mg/dL   BUN 8 6 - 20 mg/dL   Creatinine, Ser 0.71 0.44 - 1.00 mg/dL   Calcium 9.0 8.9 - 10.3 mg/dL   Total Protein 6.7 6.5 - 8.1 g/dL   Albumin 3.1 (L) 3.5 - 5.0 g/dL   AST 14 (L) 15 - 41 U/L   ALT 10 (L) 14 - 54 U/L   Alkaline Phosphatase 116 38 - 126 U/L   Total Bilirubin 0.3 0.3 - 1.2 mg/dL   GFR calc non Af Amer >60 >60 mL/min   GFR calc Af Amer >60 >60 mL/min    Comment: (NOTE) The eGFR has been calculated using the CKD EPI equation. This calculation has not been validated in all clinical situations. eGFR's persistently <60 mL/min signify possible Chronic Kidney Disease.    Anion  gap 9 5 - 15  CBC with Differential/Platelet     Status: None   Collection Time: 09/24/16  4:19 PM  Result Value Ref Range   WBC 9.8 4.0 - 10.5 K/uL   RBC 4.45 3.87 - 5.11 MIL/uL   Hemoglobin 13.6 12.0 - 15.0 g/dL   HCT 37.8 36.0 - 46.0 %   MCV 84.9 78.0 - 100.0 fL   MCH 30.6 26.0 - 34.0 pg   MCHC 36.0 30.0 - 36.0 g/dL   RDW 13.2 11.5 - 15.5 %   Platelets 297 150 - 400 K/uL   Neutrophils Relative % 75 %   Neutro Abs 7.3 1.7 - 7.7 K/uL   Lymphocytes Relative 22 %   Lymphs Abs 2.1 0.7 - 4.0 K/uL   Monocytes Relative 3 %   Monocytes Absolute 0.3 0.1 - 1.0 K/uL   Eosinophils Relative 0 %   Eosinophils Absolute 0.0 0.0 - 0.7 K/uL   Basophils Relative 0 %   Basophils Absolute 0.0 0.0 - 0.1 K/uL  Type and screen [place]     Status:  None   Collection Time: 09/24/16  5:23 PM  Result Value Ref Range   ABO/RH(D) A NEG    Antibody Screen NEG    Sample Expiration 09/27/2016   Protein, urine, 24 hour     Status: None   Collection Time: 09/25/16  5:50 PM  Result Value Ref Range   Urine Total Volume-UPROT 1,000 mL   Collection Interval-UPROT 24 hours   Protein, Urine 9 mg/dL   Protein, 24H Urine 90 50 - 100 mg/day    Comment: Performed at [facility]    Medications:  Scheduled . docusate sodium  100 mg Oral Daily  . prenatal multivitamin  1 tablet Oral Q1200   I have reviewed the patient's current medications.  ASSESSMENT: [id number] 67w6dEstimated Date of Delivery: 10/18/16  Patient Active Problem List   Diagnosis Date Noted  . Severe preeclampsia superimposed on chronic hypertension, antepartum 06/26/2016  . Supervision of high risk pregnancy, antepartum 03/27/2016  . Obesity in pregnancy 09/23/2013  . Rh negative status during pregnancy, antepartum 07/07/2013    PLAN: Given persistent severe headache, she meets criteria for superimposed severe preeclampsia and delivery is indicated.  Will start magnesium sulfate for eclampsia prophylaxis, and move to L&D for induction of labor. Betamethasone regimen was not administered due to severe allergy to prednisone.  Category I FHR tracing for now. Will continue to monitor closely.   Aquil Duhe A, MD 09/26/2016,11:07 AM

## 2016-09-26 NOTE — Progress Notes (Signed)
Called to beside to evaluate pt for persistent bleeding after SVD. Pt has already received of cytotec PO. Vaginal exam reveal clots at the cervical os. Pt given of fentanyl and bimanual exam preformed. Clots removed. Bleeding improved.

## 2016-09-27 ENCOUNTER — Other Ambulatory Visit: Payer: BLUE CROSS/BLUE SHIELD | Admitting: Obstetrics and Gynecology

## 2016-09-27 ENCOUNTER — Ambulatory Visit (HOSPITAL_COMMUNITY): Admission: RE | Admit: 2016-09-27 | Payer: BLUE CROSS/BLUE SHIELD | Source: Ambulatory Visit

## 2016-09-27 LAB — CBC
HCT: 32.8 % — ABNORMAL LOW (ref 36.0–46.0)
Hemoglobin: 11.5 g/dL — ABNORMAL LOW (ref 12.0–15.0)
MCH: 29.9 pg (ref 26.0–34.0)
MCHC: 35.1 g/dL (ref 30.0–36.0)
MCV: 85.2 fL (ref 78.0–100.0)
PLATELETS: 271 10*3/uL (ref 150–400)
RBC: 3.85 MIL/uL — ABNORMAL LOW (ref 3.87–5.11)
RDW: 13.2 % (ref 11.5–15.5)
WBC: 15.7 10*3/uL — ABNORMAL HIGH (ref 4.0–10.5)

## 2016-09-27 LAB — RPR: RPR Ser Ql: NONREACTIVE

## 2016-09-27 MED ORDER — RHO D IMMUNE GLOBULIN 1500 UNIT/2ML IJ SOSY
300.0000 ug | PREFILLED_SYRINGE | Freq: Once | INTRAMUSCULAR | Status: AC
  Administered 2016-09-27: 300 ug via INTRAMUSCULAR
  Filled 2016-09-27: qty 2

## 2016-09-27 MED ORDER — AMLODIPINE BESYLATE 10 MG PO TABS
10.0000 mg | ORAL_TABLET | Freq: Every day | ORAL | Status: DC
  Administered 2016-09-27 – 2016-09-28 (×2): 10 mg via ORAL
  Filled 2016-09-27 (×2): qty 1

## 2016-09-27 NOTE — Progress Notes (Signed)
POSTPARTUM PROGRESS NOTE  Post Partum Day 1 Subjective:  Barbara Robertson is a 25 y.o. [id number] 3661w6d s/p SVD after induction for severe pre-eclampsia.  No acute events overnight.  Pt denies problems with ambulating, voiding or po intake.  She denies nausea or vomiting.  Pain is well controlled.  She has had flatus. She has not had bowel movement.  Lochia Moderate.   Pt denies headache at this time. She has had persistently elevated BPs.  She remains on magnesium, but has been up and walking around.   Objective: Blood pressure (!) 148/89, pulse 94, temperature 98.8 F (37.1 C), temperature source Oral, resp. rate 18, height 6\' 1"  (1.854 m), weight 254 lb (115.2 kg), last menstrual period 01/12/2016, SpO2 95 %, unknown if currently breastfeeding.  Physical Exam:  General: alert, cooperative and no distress Lochia:normal flow Chest: no respiratory distress Heart:regular rate, distal pulses intact Abdomen: soft, nontender,  Uterine Fundus: firm, appropriately tender DVT Evaluation: No calf swelling or tenderness Extremities: trace edema   Recent Labs  09/26/16 1220 09/27/16 0511  HGB 13.1 11.5*  HCT 37.9 32.8*    Assessment/Plan:  ASSESSMENT: Barbara ShanksMindy B Winkel is a 25 y.o. [id number] 8461w6d s/p SVD induction of labor for pre-eclampsia with severe features/  1. Routine post partum care 2. Pre-eclampsia: pt started on amlodipine 10mg  today. Continue to monitor BP. Magnesium until 12PM   LOS: 1 day   Les Pouicholas SchenkMD 09/27/2016, 9:09 AM

## 2016-09-28 ENCOUNTER — Encounter (HOSPITAL_COMMUNITY): Payer: Self-pay | Admitting: *Deleted

## 2016-09-28 DIAGNOSIS — R0989 Other specified symptoms and signs involving the circulatory and respiratory systems: Secondary | ICD-10-CM | POA: Diagnosis present

## 2016-09-28 LAB — RH IG WORKUP (INCLUDES ABO/RH)
ABO/RH(D): A NEG
Fetal Screen: NEGATIVE
Gestational Age(Wks): 36
Unit division: 0

## 2016-09-28 LAB — BIRTH TISSUE RECOVERY COLLECTION (PLACENTA DONATION)

## 2016-09-28 MED ORDER — NORGESTIMATE-ETH ESTRADIOL 0.25-35 MG-MCG PO TABS: 1.0000 | ORAL_TABLET | Freq: Every day | ORAL | 12 refills | Status: DC

## 2016-09-28 MED ORDER — AMLODIPINE BESYLATE 10 MG PO TABS: 10.0000 mg | ORAL_TABLET | Freq: Every day | ORAL | 1 refills | Status: DC

## 2016-09-28 MED ORDER — IBUPROFEN 600 MG PO TABS: 600.0000 mg | ORAL_TABLET | Freq: Four times a day (QID) | ORAL | 1 refills | Status: DC | PRN

## 2016-09-28 MED ORDER — PRENATAL MULTIVITAMIN CH: 1.0000 | ORAL_TABLET | Freq: Every day | ORAL | 12 refills | Status: DC

## 2016-09-28 NOTE — Discharge Instructions (Signed)

## 2016-09-28 NOTE — Discharge Summary (Signed)
OB Discharge Summary     Patient Name: Barbara Robertson DOB: Jan 21, 1991  MRN: [id number]  Date of admission: 09/24/2016 Delivering MD: Marylene Land   Date of discharge: 09/28/2016  Admitting diagnosis: 36.4 WKS, preeclampsia w/ severe features (HA) Intrauterine pregnancy: [redacted]w[redacted]d     Secondary diagnosis:  Principal Problem:   Severe preeclampsia superimposed on chronic hypertension  Additional problems: None     Discharge diagnosis: Preterm Pregnancy Delivered and Preeclampsia (severe)                                                                                                Post partum procedures:rhogam  Augmentation: AROM and Pitocin  Complications: None  Hospital course:  Induction of Labor With Vaginal Delivery   25 y.o. yo [id number] at [redacted]w[redacted]d was admitted to the hospital 09/24/2016 for induction of labor.  Indication for induction: Preeclampsia.  Patient had an uncomplicated labor course as follows: Membrane Rupture Time/Date: 1:54 PM ,09/26/2016   Intrapartum Procedures: Episiotomy: None [1]                                         Lacerations:  1st degree [2];Periurethral [8]  Patient had delivery of a Viable infant.  Information for the patient's newborn:  Barbara, Uhlir Girl Robertson [[id number]]  Delivery Method: Vag-Spont   09/26/2016  Details of delivery can be found in separate delivery note.  Patient had a routine postpartum course. Patient is discharged home 09/28/16.   Physical exam Vitals:   09/27/16 1729 09/27/16 2136 09/28/16 0207 09/28/16 0559  BP: 124/78 129/74 (!) 140/91 (!) 142/80  Pulse: 98 88 77 69  Resp: 18 18 18 18   Temp: 97 F (36.1 C) 98.3 F (36.8 C) 98.9 F (37.2 C) 97.8 F (36.6 C)  TempSrc: Oral Oral Oral Oral  SpO2: 97% 98% 96% 98%  Weight:    250 lb (113.4 kg)  Height:       General: alert, cooperative and no distress Lochia: appropriate Uterine Fundus: firm Incision: N/A DVT Evaluation: No evidence of DVT seen on physical  exam. Labs: Lab Results  Component Value Date   WBC 15.7 (H) 09/27/2016   HGB 11.5 (L) 09/27/2016   HCT 32.8 (L) 09/27/2016   MCV 85.2 09/27/2016   PLT 271 09/27/2016   CMP Latest Ref Rng & Units 09/26/2016  Glucose 65 - 99 mg/dL 413(K)  BUN 6 - 20 mg/dL 7  Creatinine 4.40 - 1.02 mg/dL 7.25  Sodium 366 - 440 mmol/L 137  Potassium 3.5 - 5.1 mmol/L 3.6  Chloride 101 - 111 mmol/L 108  CO2 22 - 32 mmol/L 19(L)  Calcium 8.9 - 10.3 mg/dL 8.9  Total Protein 6.5 - 8.1 g/dL 6.1(L)  Total Bilirubin 0.3 - 1.2 mg/dL 0.5  Alkaline Phos 38 - 126 U/L 117  AST 15 - 41 U/L 15  ALT 14 - 54 U/L 10(L)    Discharge instruction: per After Visit Summary and "Baby and Me Booklet".  After visit meds:  Medication List    STOP taking these medications   aspirin EC 81 MG tablet     TAKE these medications   amLODipine 10 MG tablet Commonly known as:  NORVASC Take 1 tablet (10 mg total) by mouth daily.   butalbital-acetaminophen-caffeine 50-325-40 MG tablet Commonly known as:  FIORICET, ESGIC Take 1-2 tablets by mouth every 6 (six) hours as needed for headache.   ibuprofen 600 MG tablet Commonly known as:  ADVIL,MOTRIN Take 1 tablet (600 mg total) by mouth every 6 (six) hours as needed.   norgestimate-ethinyl estradiol 0.25-35 MG-MCG tablet Commonly known as:  ORTHO-CYCLEN,SPRINTEC,PREVIFEM Take 1 tablet by mouth daily. Start taking on:  10/29/2016   pantoprazole 40 MG tablet Commonly known as:  PROTONIX Take 1 tablet (40 mg total) by mouth daily.   prenatal multivitamin Tabs tablet Take 1 tablet by mouth daily.       Diet: routine diet  Activity: Advance as tolerated. Pelvic rest for 6 weeks.   Outpatient follow up:6 weeks Follow up Appt:Future Appointments Date Time Provider Department Center  11/08/2016 2:40 PM Aviva SignsMarie L Williams, CNM [facility]   Follow up Visit: Follow-up Information    [facility]   Specialty:   Obstetrics and Gynecology Why:  Blood pressure check and 6 week postpartum visit.  Contact information: [address] [place] [phone]       [facility] [place] [facility] ADMISSIONS .   Why:  as needed in emergencies Contact information: [address] [id number] [place] [phone]          Postpartum contraception: Combination OCPs  Newborn Data: Live born female  Birth Weight: 7 lb 10.8 oz (3480 g) APGAR: 9, 9  Baby Feeding: Bottle Disposition:home with mother   09/28/2016 Barbara KinsmanVirginia Hailie Robertson, CNM

## 2016-10-03 ENCOUNTER — Telehealth: Payer: Self-pay | Admitting: *Deleted

## 2016-10-03 NOTE — Telephone Encounter (Addendum)
Called pt @ both home and mobile numbers and did not get an answer at either number. There was no voicemail.  Pt missed appt today for BP check. She delivered her baby on 10/17 and had severe pre-e. She needs BP check this week.  10/26  0930  Called pt @ both telephone numbers and did not get answer- rings 7 times, then disconnects - no voice mail. Letter sent to pt.

## 2016-10-04 ENCOUNTER — Other Ambulatory Visit: Payer: BLUE CROSS/BLUE SHIELD | Admitting: Obstetrics and Gynecology

## 2016-10-04 ENCOUNTER — Ambulatory Visit (HOSPITAL_COMMUNITY): Payer: BLUE CROSS/BLUE SHIELD

## 2016-10-05 ENCOUNTER — Encounter: Payer: Self-pay | Admitting: *Deleted

## 2016-10-11 ENCOUNTER — Ambulatory Visit (HOSPITAL_COMMUNITY): Payer: BLUE CROSS/BLUE SHIELD

## 2016-10-11 ENCOUNTER — Other Ambulatory Visit: Payer: BLUE CROSS/BLUE SHIELD | Admitting: Obstetrics & Gynecology

## 2016-11-08 ENCOUNTER — Encounter: Payer: Self-pay | Admitting: Advanced Practice Midwife

## 2016-11-08 ENCOUNTER — Ambulatory Visit (INDEPENDENT_AMBULATORY_CARE_PROVIDER_SITE_OTHER): Payer: BLUE CROSS/BLUE SHIELD | Admitting: Advanced Practice Midwife

## 2016-11-08 DIAGNOSIS — O1410 Severe pre-eclampsia, unspecified trimester: Secondary | ICD-10-CM

## 2016-11-08 NOTE — Progress Notes (Signed)
Subjective:     Barbara Robertson is a 25 y.o. female who presents for a postpartum visit. She is 6 weeks postpartum following a spontaneous vaginal delivery. I have fully reviewed the prenatal and intrapartum course. The delivery was at 36.6 gestational weeks. Outcome: spontaneous vaginal delivery. Anesthesia: none. Postpartum course has been complicated by preeclampsia. Patient is hypertensive today. Baby's course has been unremarkable. Baby is feeding by bottle - Similac Advance. Bleeding red. Bowel function is normal. Bladder function is normal. Patient is not sexually active. Contraception method is OCP (estrogen/progesterone). Postpartum depression screening: negative.  The following portions of the patient's history were reviewed and updated as appropriate: allergies, current medications, past family history, past medical history, past social history, past surgical history and problem list.  Review of Systems Pertinent items are noted in HPI.   Objective:    BP 137/83 (BP Location: Left Arm, Patient Position: Sitting, Cuff Size: Large)   Pulse (!) 107   Wt 243 lb (110.2 kg)   LMP 11/05/2016   Breastfeeding? No   BMI 32.06 kg/m   General:  alert, cooperative and no distress   Breasts:  inspection negative, no nipple discharge or bleeding, no masses or nodularity palpable  Lungs: clear to auscultation bilaterally  Heart:  regular rate and rhythm, S1, S2 normal, no murmur, click, rub or gallop  Abdomen: soft, non-tender; bowel sounds normal; no masses,  no organomegaly   Vulva:  not evaluated  Vagina: not evaluated  Cervix:  n/a  Corpus: not examined  Adnexa:  not evaluated  Rectal Exam: Not performed.        Assessment:     Normal postpartum exam. Pap smear not done at today's visit.     Chronic hypertension     Plan:    1. Contraception: OCP (estrogen/progesterone) 2. Discussed Norvasc. Still on 10mg  daily.  Pt will contact primary doctor to evaluate whether or not to  decrease dose, stay on dose or try stopping. Was never on meds nonpregnant before pregnancy. 3. Follow up in: 1 year or prn. WIllfollow Bp with primary doctor or as needed.

## 2016-11-08 NOTE — Patient Instructions (Signed)
Oral Contraception Use Oral contraceptive pills (OCPs) are medicines taken to prevent pregnancy. OCPs work by preventing the ovaries from releasing eggs. The hormones in OCPs also cause the cervical mucus to thicken, preventing the sperm from entering the uterus. The hormones also cause the uterine lining to become thin, not allowing a fertilized egg to attach to the inside of the uterus. OCPs are highly effective when taken exactly as prescribed. However, OCPs do not prevent sexually transmitted diseases (STDs). Safe sex practices, such as using condoms along with an OCP, can help prevent STDs. Before taking OCPs, you may have a physical exam and Pap test. Your health care provider may also order blood tests if necessary. Your health care provider will make sure you are a good candidate for oral contraception. Discuss with your health care provider the possible side effects of the OCP you may be prescribed. When starting an OCP, it can take 2 to 3 months for the body to adjust to the changes in hormone levels in your body. How to take oral contraceptive pills Your health care provider may advise you on how to start taking the first cycle of OCPs. Otherwise, you can:  Start on day 1 of your menstrual period. You will not need any backup contraceptive protection with this start time.  Start on the first Sunday after your menstrual period or the day you get your prescription. In these cases, you will need to use backup contraceptive protection for the first week.  Start the pill at any time of your cycle. If you take the pill within 5 days of the start of your period, you are protected against pregnancy right away. In this case, you will not need a backup form of birth control. If you start at any other time of your menstrual cycle, you will need to use another form of birth control for 7 days. If your OCP is the type called a minipill, it will protect you from pregnancy after taking it for 2 days (48  hours).  After you have started taking OCPs:  If you forget to take 1 pill, take it as soon as you remember. Take the next pill at the regular time.  If you miss 2 or more pills, call your health care provider because different pills have different instructions for missed doses. Use backup birth control until your next menstrual period starts.  If you use a 28-day pack that contains inactive pills and you miss 1 of the last 7 pills (pills with no hormones), it will not matter. Throw away the rest of the non-hormone pills and start a new pill pack.  No matter which day you start the OCP, you will always start a new pack on that same day of the week. Have an extra pack of OCPs and a backup contraceptive method available in case you miss some pills or lose your OCP pack. Follow these instructions at home:  Do not smoke.  Always use a condom to protect against STDs. OCPs do not protect against STDs.  Use a calendar to mark your menstrual period days.  Read the information and directions that came with your OCP. Talk to your health care provider if you have questions. Contact a health care provider if:  You develop nausea and vomiting.  You have abnormal vaginal discharge or bleeding.  You develop a rash.  You miss your menstrual period.  You are losing your hair.  You need treatment for mood swings or depression.  You   get dizzy when taking the OCP.  You develop acne from taking the OCP.  You become pregnant. Get help right away if:  You develop chest pain.  You develop shortness of breath.  You have an uncontrolled or severe headache.  You develop numbness or slurred speech.  You develop visual problems.  You develop pain, redness, and swelling in the legs. This information is not intended to replace advice given to you by your health care provider. Make sure you discuss any questions you have with your health care provider. Document Released: 11/16/2011 Document  Revised: 05/04/2016 Document Reviewed: 05/18/2013 Elsevier Interactive Patient Education  2017 [employer]  

## 2016-11-20 ENCOUNTER — Other Ambulatory Visit: Payer: Self-pay | Admitting: Advanced Practice Midwife

## 2017-01-03 ENCOUNTER — Ambulatory Visit: Payer: Self-pay | Admitting: Nurse Practitioner

## 2017-01-05 ENCOUNTER — Telehealth: Payer: Self-pay | Admitting: Nurse Practitioner

## 2017-01-05 NOTE — Telephone Encounter (Signed)
Patient said she brought her daughter in this week and saw Eber JonesCarolyn.  She said while she was in the room, she mentioned a Roseum shot to Mauldinarolyn and if we would be able to give this to her if her and her husband decided to get pregnant again.  She said Eber JonesCarolyn was checking on this for her, but she hasn't heard anything back yet.  Please advise.

## 2017-01-23 ENCOUNTER — Encounter: Payer: Self-pay | Admitting: Family Medicine

## 2017-02-22 ENCOUNTER — Encounter: Payer: Self-pay | Admitting: Nurse Practitioner

## 2017-04-05 ENCOUNTER — Ambulatory Visit: Payer: Self-pay | Admitting: Nurse Practitioner

## 2017-04-10 ENCOUNTER — Ambulatory Visit: Payer: BLUE CROSS/BLUE SHIELD | Admitting: Obstetrics and Gynecology

## 2017-04-12 ENCOUNTER — Encounter: Payer: Self-pay | Admitting: Student

## 2017-04-12 ENCOUNTER — Ambulatory Visit (INDEPENDENT_AMBULATORY_CARE_PROVIDER_SITE_OTHER): Payer: Medicaid Other | Admitting: Student

## 2017-04-12 VITALS — BP 147/99 | HR 91 | Ht 72.0 in

## 2017-04-12 DIAGNOSIS — Z3041 Encounter for surveillance of contraceptive pills: Secondary | ICD-10-CM | POA: Diagnosis not present

## 2017-04-12 DIAGNOSIS — I10 Essential (primary) hypertension: Secondary | ICD-10-CM | POA: Diagnosis not present

## 2017-04-12 DIAGNOSIS — N939 Abnormal uterine and vaginal bleeding, unspecified: Secondary | ICD-10-CM

## 2017-04-12 MED ORDER — MEGESTROL ACETATE 40 MG PO TABS: 40.0000 mg | ORAL_TABLET | Freq: Two times a day (BID) | ORAL | 0 refills | Status: DC

## 2017-04-12 MED ORDER — AMLODIPINE BESYLATE 10 MG PO TABS: 10.0000 mg | ORAL_TABLET | Freq: Every day | ORAL | 1 refills | Status: DC

## 2017-04-12 MED ORDER — NORETHIN ACE-ETH ESTRAD-FE 1-20 MG-MCG(24) PO TABS: 1.0000 | ORAL_TABLET | Freq: Every day | ORAL | 11 refills | Status: DC

## 2017-04-12 NOTE — Progress Notes (Signed)
Subjective:     Barbara Robertson is a 26 y.o. female here for contraception counseling. Current complaints: irregular bleeding on OCPs. Patient has been on sprintec since her delivery 09/2016. Reports vaginal bleeding 2-3 weeks out of the month. States she takes the pill at the same time everyday. Reports some irregularity with her bleeding prior to this pregnancy but never this bad. Desires change in contraception. Condsidering having another baby in the next 1-2 years.  Patient is chronic hypertensive & started on norvasc after delivery. Has run out of her medication & not met with her PCP yet.    Gynecologic History Patient's last menstrual period was 04/09/2017 (approximate). Contraception: OCP (estrogen/progesterone) Last Pap: 2014. Results were: normal Last mammogram: n/a.   Obstetric History OB History  Gravida Para Term Preterm AB Living  _0 SAB TAB Ectopic Multiple Live Births  1 0 0 0 2    # Outcome Date GA Lbr Len/2nd Weight Sex Delivery Anes PTL Lv  3 Preterm 09/26/16 [id number] / 00:02 7 lb 10.8 oz (3.48 kg) F Vag-Spont None  LIV  2 SAB 11/05/15          1 Term 01/30/14 [id number] lb 3.7 oz (2.825 kg) F Vag-Spont EPI  LIV    Obstetric Comments  No rhogam with SAB in Nov 2016    The following portions of the patient's history were reviewed and updated as appropriate: allergies, current medications, past family history, past medical history, past social history, past surgical history and problem list.  Review of Systems Pertinent items are noted in HPI.    Objective:      Physical Examination: General appearance - alert, well appearing, and in no distress, oriented to person, place, and time and overweight Mental status - alert, oriented to person, place, and time Chest - clear to auscultation, no wheezes, rales or rhonchi, symmetric air entry Heart - normal rate, regular rhythm, normal S1, S2, no murmurs, rubs, clicks or gallops  Pelvic exam deferred today as  patient is here with [place] child & only wished to discuss medication management.  Assessment:    Chronic hypertension - asymptomatc  Reviewed all forms of birth control options available including abstinence; over the counter/barrier methods; hormonal contraceptive medication including pill, patch, ring, injection,contraceptive implant; hormonal and nonhormonal IUDs; permanent sterilization options including vasectomy and the various tubal sterilization modalities. Risks and benefits reviewed.  Questions were answered.  Information was given to patient to review.   C/w Dr. AnHarolyn Rutherfordegarding treatment of symptoms. Will switch patient to OCP with lower estrogen d/t hypertension & weight. Will rx megace 40 mg BID x 2-3 weeks to help with irregular bleeding.    Plan:  1. Encounter for surveillance of contraceptive pills  - Norethindrone Acetate-Ethinyl Estrad-FE (LOESTRIN 24 FE) 1-20 MG-MCG(24) tablet; Take 1 tablet by mouth daily.  Dispense: 1 Package; Refill: 11  2. Chronic hypertension -Stressed importance of follow up with PCP to manage hypertension - amLODipine (NORVASC) 10 MG tablet; Take 1 tablet (10 mg total) by mouth daily.  Dispense: 30 tablet; Refill: 1  3. Abnormal uterine bleeding (AUB) -Return in 1-2 months if symptoms worsen or don't improve - megestrol (MEGACE) 40 MG tablet; Take 1 tablet (40 mg total) by mouth 2 (two) times daily.  Dispense: 30 tablet; Refill: 0   ErJorje GuildNP

## 2017-04-12 NOTE — Patient Instructions (Signed)
Hypertension Hypertension, commonly called high blood pressure, is when the force of blood pumping through the arteries is too strong. The arteries are the blood vessels that carry blood from the heart throughout the body. Hypertension forces the heart to work harder to pump blood and may cause arteries to become narrow or stiff. Having untreated or uncontrolled hypertension can cause heart attacks, strokes, kidney disease, and other problems. A blood pressure reading consists of a higher number over a lower number. Ideally, your blood pressure should be below 120/80. The first ("top") number is called the systolic pressure. It is a measure of the pressure in your arteries as your heart beats. The second ("bottom") number is called the diastolic pressure. It is a measure of the pressure in your arteries as the heart relaxes. What are the causes? The cause of this condition is not known. What increases the risk? Some risk factors for high blood pressure are under your control. Others are not. Factors you can change   Smoking.  Having type 2 diabetes mellitus, high cholesterol, or both.  Not getting enough exercise or physical activity.  Being overweight.  Having too much fat, sugar, calories, or salt (sodium) in your diet.  Drinking too much alcohol. Factors that are difficult or impossible to change   Having chronic kidney disease.  Having a family history of high blood pressure.  Age. Risk increases with age.  Race. You may be at higher risk if you are African-American.  Gender. Men are at higher risk than women before age 45. After age 65, women are at higher risk than men.  Having obstructive sleep apnea.  Stress. What are the signs or symptoms? Extremely high blood pressure (hypertensive crisis) may cause:  Headache.  Anxiety.  Shortness of breath.  Nosebleed.  Nausea and vomiting.  Severe chest pain.  Jerky movements you cannot control (seizures). How is this  diagnosed? This condition is diagnosed by measuring your blood pressure while you are seated, with your arm resting on a surface. The cuff of the blood pressure monitor will be placed directly against the skin of your upper arm at the level of your heart. It should be measured at least twice using the same arm. Certain conditions can cause a difference in blood pressure between your right and left arms. Certain factors can cause blood pressure readings to be lower or higher than normal (elevated) for a short period of time:  When your blood pressure is higher when you are in a health care provider's office than when you are at home, this is called white coat hypertension. Most people with this condition do not need medicines.  When your blood pressure is higher at home than when you are in a health care provider's office, this is called masked hypertension. Most people with this condition may need medicines to control blood pressure. If you have a high blood pressure reading during one visit or you have normal blood pressure with other risk factors:  You may be asked to return on a different day to have your blood pressure checked again.  You may be asked to monitor your blood pressure at home for 1 week or longer. If you are diagnosed with hypertension, you may have other blood or imaging tests to help your health care provider understand your overall risk for other conditions. How is this treated? This condition is treated by making healthy lifestyle changes, such as eating healthy foods, exercising more, and reducing your alcohol intake. Your health   care provider may prescribe medicine if lifestyle changes are not enough to get your blood pressure under control, and if:  Your systolic blood pressure is above 130.  Your diastolic blood pressure is above 80. Your personal target blood pressure may vary depending on your medical conditions, your age, and other factors. Follow these instructions  at home: Eating and drinking   Eat a diet that is high in fiber and potassium, and low in sodium, added sugar, and fat. An example eating plan is called the DASH (Dietary Approaches to Stop Hypertension) diet. To eat this way:  Eat plenty of fresh fruits and vegetables. Try to fill half of your plate at each meal with fruits and vegetables.  Eat whole grains, such as whole wheat pasta, brown rice, or whole grain bread. Fill about one quarter of your plate with whole grains.  Eat or drink low-fat dairy products, such as skim milk or low-fat yogurt.  Avoid fatty cuts of meat, processed or cured meats, and poultry with skin. Fill about one quarter of your plate with lean proteins, such as fish, chicken without skin, beans, eggs, and tofu.  Avoid premade and processed foods. These tend to be higher in sodium, added sugar, and fat.  Reduce your daily sodium intake. Most people with hypertension should eat less than 1,500 mg of sodium a day.  Limit alcohol intake to no more than 1 drink a day for nonpregnant women and 2 drinks a day for men. One drink equals 12 oz of beer, 5 oz of wine, or 1 oz of hard liquor. Lifestyle   Work with your health care provider to maintain a healthy body weight or to lose weight. Ask what an ideal weight is for you.  Get at least 30 minutes of exercise that causes your heart to beat faster (aerobic exercise) most days of the week. Activities may include walking, swimming, or biking.  Include exercise to strengthen your muscles (resistance exercise), such as pilates or lifting weights, as part of your weekly exercise routine. Try to do these types of exercises for 30 minutes at least 3 days a week.  Do not use any products that contain nicotine or tobacco, such as cigarettes and e-cigarettes. If you need help quitting, ask your health care provider.  Monitor your blood pressure at home as told by your health care provider.  Keep all follow-up visits as told by  your health care provider. This is important. Medicines   Take over-the-counter and prescription medicines only as told by your health care provider. Follow directions carefully. Blood pressure medicines must be taken as prescribed.  Do not skip doses of blood pressure medicine. Doing this puts you at risk for problems and can make the medicine less effective.  Ask your health care provider about side effects or reactions to medicines that you should watch for. Contact a health care provider if:  You think you are having a reaction to a medicine you are taking.  You have headaches that keep coming back (recurring).  You feel dizzy.  You have swelling in your ankles.  You have trouble with your vision. Get help right away if:  You develop a severe headache or confusion.  You have unusual weakness or numbness.  You feel faint.  You have severe pain in your chest or abdomen.  You vomit repeatedly.  You have trouble breathing. Summary  Hypertension is when the force of blood pumping through your arteries is too strong. If this condition is   not controlled, it may put you at risk for serious complications.  Your personal target blood pressure may vary depending on your medical conditions, your age, and other factors. For most people, a normal blood pressure is less than 120/80.  Hypertension is treated with lifestyle changes, medicines, or a combination of both. Lifestyle changes include weight loss, eating a healthy, low-sodium diet, exercising more, and limiting alcohol. This information is not intended to replace advice given to you by your health care provider. Make sure you discuss any questions you have with your health care provider. Document Released: 11/27/2005 Document Revised: 10/25/2016 Document Reviewed: 10/25/2016 Elsevier Interactive Patient Education  2017 [employer] Contraception Choices Contraception (birth control) is the use of any methods or devices to  prevent pregnancy. Below are some methods to help avoid pregnancy. Hormonal methods  Contraceptive implant. This is a thin, plastic tube containing progesterone hormone. It does not contain estrogen hormone. Your health care provider inserts the tube in the inner part of the upper arm. The tube can remain in place for up to 3 years. After 3 years, the implant must be removed. The implant prevents the ovaries from releasing an egg (ovulation), thickens the cervical mucus to prevent sperm from entering the uterus, and thins the lining of the inside of the uterus.  Progesterone-only injections. These injections are given every 3 months by your health care provider to prevent pregnancy. This synthetic progesterone hormone stops the ovaries from releasing eggs. It also thickens cervical mucus and changes the uterine lining. This makes it harder for sperm to survive in the uterus.  Birth control pills. These pills contain estrogen and progesterone hormone. They work by preventing the ovaries from releasing eggs (ovulation). They also cause the cervical mucus to thicken, preventing the sperm from entering the uterus. Birth control pills are prescribed by a health care provider.Birth control pills can also be used to treat heavy periods.  Minipill. This type of birth control pill contains only the progesterone hormone. They are taken every day of each month and must be prescribed by your health care provider.  Birth control patch. The patch contains hormones similar to those in birth control pills. It must be changed once a week and is prescribed by a health care provider.  Vaginal ring. The ring contains hormones similar to those in birth control pills. It is left in the vagina for 3 weeks, removed for 1 week, and then a new one is put back in place. The patient must be comfortable inserting and removing the ring from the vagina.A health care provider's prescription is necessary.  Emergency contraception.  Emergency contraceptives prevent pregnancy after unprotected sexual intercourse. This pill can be taken right after sex or up to 5 days after unprotected sex. It is most effective the sooner you take the pills after having sexual intercourse. Most emergency contraceptive pills are available without a prescription. Check with your pharmacist. Do not use emergency contraception as your only form of birth control. Barrier methods  Female condom. This is a thin sheath (latex or rubber) that is worn over the penis during sexual intercourse. It can be used with spermicide to increase effectiveness.  Female condom. This is a soft, loose-fitting sheath that is put into the vagina before sexual intercourse.  Diaphragm. This is a soft, latex, dome-shaped barrier that must be fitted by a health care provider. It is inserted into the vagina, along with a spermicidal jelly. It is inserted before intercourse. The diaphragm should be left  in the vagina for 6 to 8 hours after intercourse.  Cervical cap. This is a round, soft, latex or plastic cup that fits over the cervix and must be fitted by a health care provider. The cap can be left in place for up to 48 hours after intercourse.  Sponge. This is a soft, circular piece of polyurethane foam. The sponge has spermicide in it. It is inserted into the vagina after wetting it and before sexual intercourse.  Spermicides. These are chemicals that kill or block sperm from entering the cervix and uterus. They come in the form of creams, jellies, suppositories, foam, or tablets. They do not require a prescription. They are inserted into the vagina with an applicator before having sexual intercourse. The process must be repeated every time you have sexual intercourse. Intrauterine contraception  Intrauterine device (IUD). This is a T-shaped device that is put in a woman's uterus during a menstrual period to prevent pregnancy. There are 2 types:  Copper IUD. This type of IUD  is wrapped in copper wire and is placed inside the uterus. Copper makes the uterus and fallopian tubes produce a fluid that kills sperm. It can stay in place for 10 years.  Hormone IUD. This type of IUD contains the hormone progestin (synthetic progesterone). The hormone thickens the cervical mucus and prevents sperm from entering the uterus, and it also thins the uterine lining to prevent implantation of a fertilized egg. The hormone can weaken or kill the sperm that get into the uterus. It can stay in place for 3-5 years, depending on which type of IUD is used. Permanent methods of contraception  Female tubal ligation. This is when the woman's fallopian tubes are surgically sealed, tied, or blocked to prevent the egg from traveling to the uterus.  Hysteroscopic sterilization. This involves placing a small coil or insert into each fallopian tube. Your doctor uses a technique called hysteroscopy to do the procedure. The device causes scar tissue to form. This results in permanent blockage of the fallopian tubes, so the sperm cannot fertilize the egg. It takes about 3 months after the procedure for the tubes to become blocked. You must use another form of birth control for these 3 months.  Female sterilization. This is when the female has the tubes that carry sperm tied off (vasectomy).This blocks sperm from entering the vagina during sexual intercourse. After the procedure, the man can still ejaculate fluid (semen). Natural planning methods  Natural family planning. This is not having sexual intercourse or using a barrier method (condom, diaphragm, cervical cap) on days the woman could become pregnant.  Calendar method. This is keeping track of the length of each menstrual cycle and identifying when you are fertile.  Ovulation method. This is avoiding sexual intercourse during ovulation.  Symptothermal method. This is avoiding sexual intercourse during ovulation, using a thermometer and ovulation  symptoms.  Post-ovulation method. This is timing sexual intercourse after you have ovulated. Regardless of which type or method of contraception you choose, it is important that you use condoms to protect against the transmission of sexually transmitted infections (STIs). Talk with your health care provider about which form of contraception is most appropriate for you. This information is not intended to replace advice given to you by your health care provider. Make sure you discuss any questions you have with your health care provider. Document Released: 11/27/2005 Document Revised: 05/04/2016 Document Reviewed: 05/22/2013 Elsevier Interactive Patient Education  2017 [employer].

## 2017-05-04 ENCOUNTER — Ambulatory Visit: Payer: Self-pay | Admitting: Nurse Practitioner

## 2017-05-10 ENCOUNTER — Ambulatory Visit (INDEPENDENT_AMBULATORY_CARE_PROVIDER_SITE_OTHER): Payer: Medicaid Other | Admitting: Nurse Practitioner

## 2017-05-10 ENCOUNTER — Encounter: Payer: Self-pay | Admitting: Nurse Practitioner

## 2017-05-10 VITALS — BP 128/82 | Ht 72.0 in | Wt 258.0 lb

## 2017-05-10 DIAGNOSIS — I10 Essential (primary) hypertension: Secondary | ICD-10-CM

## 2017-05-10 MED ORDER — AMLODIPINE BESYLATE 10 MG PO TABS: 10.0000 mg | ORAL_TABLET | Freq: Every day | ORAL | 5 refills | Status: DC

## 2017-05-10 NOTE — Progress Notes (Signed)
Subjective:  Presents for recheck on her hypertension. Was started on Norvasc by her gynecologist. Denies any adverse effects. No chest pain/ischemic type pain or shortness of breath. Has been struggling with her weight. Limited activity other than looking after her children.  Objective:   BP 128/82   Ht 6' (1.829 m)   Wt 258 lb (117 kg)   BMI 34.99 kg/m  NAD. Alert, oriented. Lungs clear. Heart regular rate rhythm. Lower extremities no edema.  Assessment:   Problem List Items Addressed This Visit      Cardiovascular and Mediastinum   Essential hypertension - Primary   Relevant Medications   amLODipine (NORVASC) 10 MG tablet        Plan:   Meds ordered this encounter  Medications  . amLODipine (NORVASC) 10 MG tablet    Sig: Take 1 tablet (10 mg total) by mouth daily.    Dispense:  30 tablet    Refill:  5    Order Specific Question:   Supervising Provider    Answer:   Riccardo DubinLUKING, WILLIAM S [[id number]]   Encourage continue weight loss efforts. Also increase activity. Use OTC supplements cautiously if at all. Monitor BP and pulse. Return in about 6 months (around 11/09/2017) for recheck.

## 2017-10-30 ENCOUNTER — Ambulatory Visit: Payer: Self-pay

## 2017-11-07 ENCOUNTER — Ambulatory Visit: Payer: Medicaid Other

## 2017-11-29 ENCOUNTER — Ambulatory Visit: Payer: Self-pay | Admitting: Student

## 2017-11-30 ENCOUNTER — Ambulatory Visit: Payer: Self-pay | Admitting: Obstetrics and Gynecology

## 2017-12-24 ENCOUNTER — Ambulatory Visit: Payer: Self-pay | Admitting: Obstetrics and Gynecology

## 2017-12-25 ENCOUNTER — Encounter: Payer: Self-pay | Admitting: Family Medicine

## 2017-12-25 ENCOUNTER — Ambulatory Visit (INDEPENDENT_AMBULATORY_CARE_PROVIDER_SITE_OTHER): Payer: Self-pay | Admitting: Family Medicine

## 2017-12-25 VITALS — BP 163/99 | HR 94 | Ht 72.0 in | Wt 265.3 lb

## 2017-12-25 DIAGNOSIS — Z113 Encounter for screening for infections with a predominantly sexual mode of transmission: Secondary | ICD-10-CM

## 2017-12-25 DIAGNOSIS — E669 Obesity, unspecified: Secondary | ICD-10-CM

## 2017-12-25 DIAGNOSIS — N926 Irregular menstruation, unspecified: Secondary | ICD-10-CM

## 2017-12-25 DIAGNOSIS — I10 Essential (primary) hypertension: Secondary | ICD-10-CM

## 2017-12-25 DIAGNOSIS — Z124 Encounter for screening for malignant neoplasm of cervix: Secondary | ICD-10-CM

## 2017-12-25 NOTE — Progress Notes (Signed)
GYNECOLOGY OFFICE VISIT NOTE  History:  27 y.o. [id number] here today for irregular menses. She reports long history of irregular menses, worse since she had her las baby. She was started on OCPs, but reports she is still getting her menses about every 2 weeks.   She has hx of CHTN and was previously on amlodipine 10 mg. Reports she was taken off this, and she is no longer taking, but still has prescription at home.    She denies any abnormal vaginal discharge, bleeding, pelvic pain or other concerns.   Past Medical History:  Diagnosis Date  . ADHD (attention deficit hyperactivity disorder)    not on meds  . Hypertension   . Pregnancy   . Seizures (HCC)    last seizure 2016    Past Surgical History:  Procedure Laterality Date  . NO PAST SURGERIES      The following portions of the patient's history were reviewed and updated as appropriate: allergies, current medications, past family history, past medical history, past social history, past surgical history and problem list.   Health Maintenance:  Normal pap in 2014.   Review of Systems:  Pertinent items noted in HPI and remainder of comprehensive ROS otherwise negative.   Objective:  Physical Exam BP (!) 163/99   Pulse 94   Ht 6' (1.829 m)   Wt 265 lb 4.8 oz (120.3 kg)   LMP  (LMP Unknown)   BMI 35.98 kg/m  CONSTITUTIONAL: Well-developed, well-nourished female in no acute distress.  HENT:  Normocephalic, atraumatic. External right and left ear normal. Oropharynx is clear and moist EYES: Conjunctivae and EOM are normal. No scleral icterus.  NECK: Normal range of motion, supple, no masses SKIN: Skin is warm and dry. No rash noted. Not diaphoretic. No erythema. No pallor. NEUROLOGIC: Alert and oriented to person, place, and time. Normal reflexes, muscle tone coordination. No cranial nerve deficit noted. PSYCHIATRIC: Normal mood and affect. Normal behavior. Normal judgment and thought content. CARDIOVASCULAR: Normal heart  rate noted RESPIRATORY: Effort and breath sounds normal, no problems with respiration noted ABDOMEN: Soft, no distention noted.   PELVIC: Normal appearing external genitalia; normal appearing vaginal mucosa and cervix.  No abnormal discharge noted.  Normal uterine size, no other palpable masses, no uterine or adnexal tenderness. Exam limited by body habitus. MUSCULOSKELETAL: Normal range of motion. No edema noted.   Assessment & Plan:  1. Irregular menses - US Pelvis Complete; Future - US Transvaginal Non-OB; Future - TSH - CBC --Check CBC, TSH, cultures, pap smear --Discussed management options and highly recommended IUD, pt will consider. Discussed coming off OCPs given her uncontrolled HTN. --F/u in 2 weeks after ultrasound, and likely IUD placement if U/S normal  2. Screening for cervical cancer - Cytology - PAP  3. Essential hypertension - Discussed importance of BP management and contraindication of OCP at this time given uncontrolled HTN - Restart amlodipine 10 mg daily (reprots she still has rx), and follow up with PCP ASAP  4. Obesity (BMI 30-39.9) - Counseled extensively on weight loss to help control HTN and reduce risk of other co-morbid diseases. Discussed strategies for long term weight loss and recommended that she start logging caloric intake as first step.    Routine preventative health maintenance measures emphasized. Please refer to After Visit Summary for other counseling recommendations.   Return in about 2 weeks (around 01/08/2018) for GYN follow up, IUD insertion.  Total face-to-face time with patient: >25 minutes. Over 50% of encounter was spent  on counseling and coordination of care.  Raynelle FanningJulie P. Degele, MD [facility]  Future Appointments  Date Time Provider Department Center  01/18/2018  2:00 PM [facility] 1 [facility]  01/23/2018  3:55 PM Degele, Kandra NicolasJulie P, MD [facility]

## 2017-12-25 NOTE — Patient Instructions (Signed)

## 2017-12-26 LAB — CBC
HEMATOCRIT: 43.6 % (ref 34.0–46.6)
HEMOGLOBIN: 14.9 g/dL (ref 11.1–15.9)
MCH: 29.9 pg (ref 26.6–33.0)
MCHC: 34.2 g/dL (ref 31.5–35.7)
MCV: 87 fL (ref 79–97)
Platelets: 398 10*3/uL — ABNORMAL HIGH (ref 150–379)
RBC: 4.99 x10E6/uL (ref 3.77–5.28)
RDW: 13.8 % (ref 12.3–15.4)
WBC: 7.8 10*3/uL (ref 3.4–10.8)

## 2017-12-26 LAB — TSH: TSH: 0.661 u[IU]/mL (ref 0.450–4.500)

## 2018-01-01 ENCOUNTER — Ambulatory Visit (HOSPITAL_COMMUNITY): Payer: Self-pay

## 2018-01-02 LAB — CYTOLOGY - PAP
BACTERIAL VAGINITIS: NEGATIVE
Candida vaginitis: POSITIVE — AB
Chlamydia: NEGATIVE
Diagnosis: NEGATIVE
Neisseria Gonorrhea: NEGATIVE
TRICH (WINDOWPATH): NEGATIVE

## 2018-01-07 ENCOUNTER — Encounter: Payer: Self-pay | Admitting: *Deleted

## 2018-01-18 ENCOUNTER — Ambulatory Visit (HOSPITAL_COMMUNITY)
Admission: RE | Admit: 2018-01-18 | Discharge: 2018-01-18 | Disposition: A | Discharge status: Home / Self Care | Payer: Medicaid Other | Source: Ambulatory Visit | Attending: Family Medicine | Admitting: Family Medicine

## 2018-01-18 DIAGNOSIS — Z6835 Body mass index (BMI) 35.0-35.9, adult: Secondary | ICD-10-CM | POA: Insufficient documentation

## 2018-01-18 DIAGNOSIS — E669 Obesity, unspecified: Secondary | ICD-10-CM | POA: Insufficient documentation

## 2018-01-18 DIAGNOSIS — N926 Irregular menstruation, unspecified: Secondary | ICD-10-CM | POA: Insufficient documentation

## 2018-01-23 ENCOUNTER — Ambulatory Visit (INDEPENDENT_AMBULATORY_CARE_PROVIDER_SITE_OTHER): Payer: Medicaid Other | Admitting: Family Medicine

## 2018-01-23 ENCOUNTER — Encounter: Payer: Self-pay | Admitting: Family Medicine

## 2018-01-23 VITALS — BP 136/86 | HR 114 | Wt 267.0 lb

## 2018-01-23 DIAGNOSIS — Z3049 Encounter for surveillance of other contraceptives: Secondary | ICD-10-CM | POA: Diagnosis present

## 2018-01-23 DIAGNOSIS — Z30430 Encounter for insertion of intrauterine contraceptive device: Secondary | ICD-10-CM

## 2018-01-23 DIAGNOSIS — Z975 Presence of (intrauterine) contraceptive device: Secondary | ICD-10-CM

## 2018-01-23 LAB — POCT PREGNANCY, URINE: PREG TEST UR: NEGATIVE

## 2018-01-23 MED ORDER — LEVONORGESTREL 19.5 MCG/DAY IU IUD
INTRAUTERINE_SYSTEM | Freq: Once | INTRAUTERINE | Status: AC
  Administered 2018-01-23: 17:00:00 via INTRAUTERINE

## 2018-01-23 NOTE — Progress Notes (Signed)
    GYNECOLOGY OFFICE PROCEDURE NOTE  Charlesetta ShanksMindy B Akhtar is a 27 y.o. [phone] here for Liletta IUD insertion. No GYN concerns.  Last pap smear was on 12/25/17 and was normal.  IUD Insertion Einar Crow(Lilleta) Procedure Note Patient identified, informed consent performed, consent signed.   Discussed risks of irregular bleeding, cramping, infection, malpositioning or misplacement of the IUD outside the uterus which may require further procedure such as laparoscopy. Time out was performed.  Urine pregnancy test negative.  Speculum placed in the vagina.  Cervix visualized.  Cleaned with Betadine x 2.  Grasped anteriorly with a single tooth tenaculum.  Uterus sounded to 9 cm.  Liletta IUD placed per manufacturer's recommendations.  Strings trimmed to 3 cm. Tenaculum was removed, good hemostasis noted.  Patient tolerated procedure well.   Patient was given post-procedure instructions.  She was advised to have backup contraception for one week.  Patient was also asked to check IUD strings periodically and follow up in 4 weeks for IUD check.  Raynelle FanningJulie P. Marylin Lathon, MD OB Fellow

## 2018-01-23 NOTE — Patient Instructions (Signed)

## 2018-02-01 ENCOUNTER — Encounter: Payer: Self-pay | Admitting: *Deleted

## 2018-03-05 ENCOUNTER — Ambulatory Visit: Payer: Medicaid Other | Admitting: Family Medicine

## 2018-03-05 ENCOUNTER — Telehealth: Payer: Self-pay

## 2018-03-05 NOTE — Telephone Encounter (Signed)
Barbara Robertson missed her scheduled appt for IUD. Called pt to resheduled. PT stated she will call back to reshule.

## 2018-03-12 ENCOUNTER — Encounter: Payer: Self-pay | Admitting: *Deleted

## 2018-03-18 ENCOUNTER — Encounter: Payer: Self-pay | Admitting: *Deleted

## 2018-03-29 ENCOUNTER — Other Ambulatory Visit: Payer: Self-pay | Admitting: Student

## 2018-03-29 DIAGNOSIS — Z3041 Encounter for surveillance of contraceptive pills: Secondary | ICD-10-CM

## 2018-04-02 ENCOUNTER — Telehealth: Payer: Self-pay

## 2018-04-02 DIAGNOSIS — Z3041 Encounter for surveillance of contraceptive pills: Secondary | ICD-10-CM

## 2018-04-02 MED ORDER — NORETHIN ACE-ETH ESTRAD-FE 1-20 MG-MCG(24) PO TABS: 1.0000 | ORAL_TABLET | Freq: Every day | ORAL | 11 refills | Status: DC

## 2018-04-02 NOTE — Telephone Encounter (Signed)
Pt call transferred from the front office.  Pt requested a refill on her BCP because she does not have any more refills.  Per chart review pt has had a current pap smear.  Per Doroteo GlassmanPhelps, DO pt can have refill. Pt notified BCP will be e-prescribed as requested.  Pt stated thank you with no further questions.

## 2018-04-03 ENCOUNTER — Ambulatory Visit (INDEPENDENT_AMBULATORY_CARE_PROVIDER_SITE_OTHER): Payer: Self-pay | Admitting: Nurse Practitioner

## 2018-04-03 ENCOUNTER — Encounter: Payer: Self-pay | Admitting: Nurse Practitioner

## 2018-04-03 VITALS — BP 138/86 | Ht 72.0 in | Wt 265.4 lb

## 2018-04-03 DIAGNOSIS — S39012A Strain of muscle, fascia and tendon of lower back, initial encounter: Secondary | ICD-10-CM

## 2018-04-03 DIAGNOSIS — M545 Low back pain: Secondary | ICD-10-CM

## 2018-04-03 MED ORDER — NAPROXEN 500 MG PO TABS: 500.0000 mg | ORAL_TABLET | Freq: Two times a day (BID) | ORAL | 0 refills | Status: DC

## 2018-04-03 MED ORDER — HYDROCODONE-ACETAMINOPHEN 5-325 MG PO TABS: 1.0000 | ORAL_TABLET | ORAL | 0 refills | Status: DC | PRN

## 2018-04-03 MED ORDER — CYCLOBENZAPRINE HCL 10 MG PO TABS: 10.0000 mg | ORAL_TABLET | Freq: Three times a day (TID) | ORAL | 0 refills | Status: DC | PRN

## 2018-04-03 NOTE — Patient Instructions (Signed)
Lidocaine patch TENS unit [facility](Icy Hot Smart Relief) Stretching

## 2018-04-04 ENCOUNTER — Encounter: Payer: Self-pay | Admitting: Nurse Practitioner
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
## 2018-04-25 ENCOUNTER — Other Ambulatory Visit: Payer: Self-pay | Admitting: Nurse Practitioner

## 2018-04-25 DIAGNOSIS — I10 Essential (primary) hypertension: Secondary | ICD-10-CM

## 2018-10-02 ENCOUNTER — Ambulatory Visit (INDEPENDENT_AMBULATORY_CARE_PROVIDER_SITE_OTHER): Payer: Self-pay

## 2018-10-02 DIAGNOSIS — Z23 Encounter for immunization: Secondary | ICD-10-CM

## 2019-01-02 ENCOUNTER — Telehealth: Payer: Self-pay | Admitting: Family Medicine

## 2019-01-02 ENCOUNTER — Other Ambulatory Visit: Payer: Self-pay | Admitting: *Deleted

## 2019-01-02 DIAGNOSIS — I10 Essential (primary) hypertension: Secondary | ICD-10-CM

## 2019-01-02 MED ORDER — AMLODIPINE BESYLATE 10 MG PO TABS: 10.0000 mg | ORAL_TABLET | Freq: Every day | ORAL | 0 refills | Status: DC

## 2019-01-02 NOTE — Telephone Encounter (Signed)
Refill sent to pharm with a note that she needs office visit

## 2019-01-02 NOTE — Telephone Encounter (Signed)
Ok 30 d, must have appt for future refills

## 2019-01-02 NOTE — Telephone Encounter (Signed)
Pharmacy requesting refill on Amlodipine 10 mg tablet. Take one tablet by mouth every day. Pt last seen 04/03/18 for back strain. No future appts made at this time. Please advise. Thank you

## 2019-01-25 ENCOUNTER — Other Ambulatory Visit: Payer: Self-pay | Admitting: Family Medicine

## 2019-01-25 DIAGNOSIS — I10 Essential (primary) hypertension: Secondary | ICD-10-CM

## 2019-01-27 NOTE — Telephone Encounter (Signed)
Ok times one needs six mo ck up 

## 2019-03-08 ENCOUNTER — Other Ambulatory Visit: Payer: Self-pay | Admitting: Family Medicine

## 2019-03-08 DIAGNOSIS — I10 Essential (primary) hypertension: Secondary | ICD-10-CM

## 2019-03-10 NOTE — Telephone Encounter (Signed)
Virtual visit this week 

## 2019-03-10 NOTE — Telephone Encounter (Signed)
Left message to return call 

## 2019-03-11 ENCOUNTER — Telehealth: Payer: Self-pay | Admitting: Family Medicine

## 2019-03-11 DIAGNOSIS — Z3041 Encounter for surveillance of contraceptive pills: Secondary | ICD-10-CM

## 2019-03-11 MED ORDER — NORETHIN ACE-ETH ESTRAD-FE 1-20 MG-MCG(24) PO TABS: 1.0000 | ORAL_TABLET | Freq: Every day | ORAL | 11 refills | Status: DC

## 2019-03-11 NOTE — Telephone Encounter (Signed)
Pt requesting a refill on her BCP and BP medication.  I informed pt that we would be able to refill her BCP and that she will need to contact her PCP for refill on BP medication.  Pt stated understanding.  Judeth Horn, NP pt refilled BCP with 11 refills.

## 2019-03-11 NOTE — Telephone Encounter (Signed)
Patient is requesting a Rx refill. Please call back.

## 2019-03-11 NOTE — Addendum Note (Signed)
Addended by: Faythe Casa on: 03/11/2019 05:06 PM   Modules accepted: Orders

## 2019-03-13 NOTE — Telephone Encounter (Signed)
Left message to return call 

## 2019-03-17 NOTE — Telephone Encounter (Signed)
Contacted patient. Pt informed she would need virtual visit this week for refills. Pt states that her screen is busted on her phone, does not have internet access at home. Pt informed that we could do a phone visit. Pt transferred up front to set up appt.

## 2019-03-18 ENCOUNTER — Other Ambulatory Visit: Payer: Self-pay

## 2019-03-18 ENCOUNTER — Encounter: Payer: Self-pay | Admitting: Family Medicine

## 2019-03-18 ENCOUNTER — Ambulatory Visit (INDEPENDENT_AMBULATORY_CARE_PROVIDER_SITE_OTHER): Payer: Self-pay | Admitting: Family Medicine

## 2019-03-18 DIAGNOSIS — I10 Essential (primary) hypertension: Secondary | ICD-10-CM

## 2019-03-18 NOTE — Progress Notes (Signed)
   Subjective:    Patient ID: Barbara Robertson, female    DOB: 11/23/1991, 28 y.o.   MRN: [id number] Telephone only visit Checks bp 2 -3 times a week 128/65  Hypertension  This is a chronic problem. The current episode started more than 1 year ago. Treatments tried: amlodipine 10mg . Compliance problems: does forget med sometimes has started setting a timer to remind her, eats clean, chicken, fish and vegetables.    Virtual Visit via Telephone Note  I connected with Barbara Robertson on 03/18/19 at  1:10 PM EDT by telephone and verified that I am speaking with the correct person using two identifiers.   I discussed the limitations, risks, security and privacy concerns of performing an evaluation and management service by telephone and the availability of in person appointments. I also discussed with the patient that there may be a patient responsible charge related to this service. The patient expressed understanding and agreed to proceed. Staying home  Blood pressure medicine and blood pressure levels reviewed today with patient. Compliant with blood pressure medicine. States does not miss a dose. No obvious side effects. Blood pressure generally good when checked elsewhere. Watching salt intake.   Numb 120  Over 60   Exercising   History of Present Illness:    Observations/Objective:   Assessment and Plan:   Follow Up Instructions:    I discussed the assessment and treatment plan with the patient. The patient was provided an opportunity to ask questions and all were answered. The patient agreed with the plan and demonstrated an understanding of the instructions.   The patient was advised to call back or seek an in-person evaluation if the symptoms worsen or if the condition fails to improve as anticipated.  I provided 15  minutes of non-face-to-face time during this encounter.    Review of Systems     Objective:   Physical Exam        Assessment & Plan:  Impression  hypertension.  Good control discussed to maintain same meds follow-up in 6 months

## 2019-03-21 ENCOUNTER — Other Ambulatory Visit: Payer: Self-pay | Admitting: *Deleted

## 2019-03-21 DIAGNOSIS — I10 Essential (primary) hypertension: Secondary | ICD-10-CM

## 2019-03-21 MED ORDER — AMLODIPINE BESYLATE 10 MG PO TABS: 10.0000 mg | ORAL_TABLET | Freq: Every day | ORAL | 5 refills | Status: DC

## 2019-09-06 ENCOUNTER — Other Ambulatory Visit: Payer: Self-pay | Admitting: Family Medicine

## 2019-09-06 DIAGNOSIS — I10 Essential (primary) hypertension: Secondary | ICD-10-CM

## 2019-09-24 ENCOUNTER — Other Ambulatory Visit: Payer: Self-pay | Admitting: Family Medicine

## 2019-09-24 DIAGNOSIS — I10 Essential (primary) hypertension: Secondary | ICD-10-CM

## 2019-10-08 ENCOUNTER — Other Ambulatory Visit: Payer: Self-pay

## 2019-10-08 ENCOUNTER — Other Ambulatory Visit (INDEPENDENT_AMBULATORY_CARE_PROVIDER_SITE_OTHER): Payer: Self-pay

## 2019-10-08 DIAGNOSIS — Z23 Encounter for immunization: Secondary | ICD-10-CM

## 2019-11-19 ENCOUNTER — Other Ambulatory Visit: Payer: Self-pay | Admitting: Family Medicine

## 2019-11-19 DIAGNOSIS — I10 Essential (primary) hypertension: Secondary | ICD-10-CM

## 2019-11-19 NOTE — Telephone Encounter (Signed)
Please contact patient to set up appt; then may route to nurses. Thank you 

## 2019-11-20 NOTE — Telephone Encounter (Signed)
Pt has virtual 12/15.

## 2019-11-25 ENCOUNTER — Ambulatory Visit (INDEPENDENT_AMBULATORY_CARE_PROVIDER_SITE_OTHER): Payer: Medicaid Other | Admitting: Family Medicine

## 2019-11-25 ENCOUNTER — Other Ambulatory Visit: Payer: Self-pay

## 2019-11-25 ENCOUNTER — Encounter: Payer: Self-pay | Admitting: Family Medicine

## 2019-11-25 DIAGNOSIS — I10 Essential (primary) hypertension: Secondary | ICD-10-CM

## 2019-11-25 MED ORDER — AMLODIPINE BESYLATE 10 MG PO TABS: 10.0000 mg | ORAL_TABLET | Freq: Every day | ORAL | 5 refills | Status: DC

## 2019-11-25 NOTE — Progress Notes (Signed)
   Subjective:  Audio plus video  Patient ID: Barbara Robertson, female    DOB: [birth year], 28 y.o.   MRN: [id number]  Pt states bp has been running 120's-130's over 85-90. Hypertension This is a chronic problem. Treatments tried: norvasc. There are no compliance problems.    Takes hydrocodone, flexeril and naprosyn when her back goes out.   Pt states her nerves have been shot lately. Her father is not in good health. phq9 and gad 7 done.   Substantial challenges with stress.  Father is in end-stage COPD.  Mother having a difficult time with his and causing undue stress to patient   Virtual Visit via Telephone Note  I connected with Barbara Robertson on 11/25/19 at  8:30 AM EST by telephone and verified that I am speaking with the correct person using two identifiers.  Location: Patient: home Provider: office   I discussed the limitations, risks, security and privacy concerns of performing an evaluation and management service by telephone and the availability of in person appointments. I also discussed with the patient that there may be a patient responsible charge related to this service. The patient expressed understanding and agreed to proceed.   History of Present Illness:    Observations/Objective:   Assessment and Plan:   Follow Up Instructions:    I discussed the assessment and treatment plan with the patient. The patient was provided an opportunity to ask questions and all were answered. The patient agreed with the plan and demonstrated an understanding of the instructions.   The patient was advised to call back or seek an in-person evaluation if the symptoms worsen or if the condition fails to improve as anticipated.  I provided 21minutes of non-face-to-face time during this encounter.  Reg exercise so so   Blood pressure medicine and blood pressure levels reviewed today with patient. Compliant with blood pressure medicine. States does not miss a dose. No obvious side  effects. Blood pressure generally good when checked elsewhere. Watching salt intake.      Review of Systems No headache, no major weight loss or weight gain, no chest pain no back pain abdominal pain no change in bowel habits complete ROS otherwise negative     Objective:   Physical Exam  Virtual      Assessment & Plan:  Impression 1 hypertension.  Control near optimal.  Change blood pressure medicine nighttime.  Diet discussed exercise discussed compliance discussed  2.  Considerable stress with father with end-stage COPD.  Stress reduction measures discussed.  Recommend no medication at this time rationale discussed follow-up in 6 months

## 2019-12-15 ENCOUNTER — Telehealth: Payer: Self-pay | Admitting: Family Medicine

## 2019-12-15 NOTE — Telephone Encounter (Signed)
Patient notified and verbalized understanding. 

## 2019-12-15 NOTE — Telephone Encounter (Signed)
Pt calling to let Dr. Brett Canales know she started taking her BP medication at night but the 2nd night she got up to go to the bathroom and became very light headed and so she sat down and checked her BP it was 115/60. She didn't take her BP medication the next day at all and restarted taking it in the mornings. She felt 100% better switching back to mornings but her BP was still elevated. She has started taking one and a quarter tablet in the mornings and that has gotten her BP to 122/83 as the highest. She would like a new script sent to pharmacy so she can continue taking it the way she is.   [id number] - [place], [place] - [address]

## 2019-12-15 NOTE — Telephone Encounter (Signed)
Do not exceed dose ten mg, numbers are decent tho not perfect, if still elevated in six mo, may need to add addtnl med

## 2019-12-15 NOTE — Telephone Encounter (Signed)
Patient states she has been on Norvasc 10mg  one qam for years. At her visit a few weeks ago she was told her numbers were good but the doctor would like them about 5 points lower and told her to switch to night but she can not take it at night because it made her feel dizzy and sick and she doesn't want to do that anymore. She has switched back to am and wants to make sure her original numbers were good- she only stated the extra quarter tablet to try to get it 5 points lower like it was advised. She doesn't want to switch med if her numbers are fine where they were

## 2019-12-15 NOTE — Telephone Encounter (Signed)
One 10 mg tab is fda allowed full dose, take this and not one and a quarter give it a few months at this dose

## 2019-12-15 NOTE — Telephone Encounter (Signed)
Please advise. Thank you

## 2020-01-12 ENCOUNTER — Encounter: Payer: Self-pay | Admitting: Family Medicine

## 2020-01-17 ENCOUNTER — Other Ambulatory Visit: Payer: Self-pay | Admitting: Student

## 2020-01-17 DIAGNOSIS — Z3041 Encounter for surveillance of contraceptive pills: Secondary | ICD-10-CM

## 2020-06-08 DIAGNOSIS — E6609 Other obesity due to excess calories: Secondary | ICD-10-CM | POA: Diagnosis not present

## 2020-06-08 DIAGNOSIS — R799 Abnormal finding of blood chemistry, unspecified: Secondary | ICD-10-CM | POA: Diagnosis not present

## 2020-06-08 DIAGNOSIS — Z01411 Encounter for gynecological examination (general) (routine) with abnormal findings: Secondary | ICD-10-CM | POA: Diagnosis not present

## 2020-06-08 DIAGNOSIS — Z3200 Encounter for pregnancy test, result unknown: Secondary | ICD-10-CM | POA: Diagnosis not present

## 2020-06-08 DIAGNOSIS — Z3202 Encounter for pregnancy test, result negative: Secondary | ICD-10-CM | POA: Diagnosis not present

## 2020-06-08 DIAGNOSIS — Z113 Encounter for screening for infections with a predominantly sexual mode of transmission: Secondary | ICD-10-CM | POA: Diagnosis not present

## 2020-06-08 DIAGNOSIS — N920 Excessive and frequent menstruation with regular cycle: Secondary | ICD-10-CM | POA: Diagnosis not present

## 2020-06-11 ENCOUNTER — Telehealth: Payer: Self-pay | Admitting: Family Medicine

## 2020-06-11 ENCOUNTER — Other Ambulatory Visit: Payer: Self-pay | Admitting: *Deleted

## 2020-06-11 DIAGNOSIS — I10 Essential (primary) hypertension: Secondary | ICD-10-CM

## 2020-06-11 NOTE — Telephone Encounter (Signed)
Amlodipine has already been refilled. Lisinopril added to med list.

## 2020-06-11 NOTE — Telephone Encounter (Signed)
Scheduled 7/22

## 2020-06-11 NOTE — Telephone Encounter (Signed)
Ok, confused, does she need amilodipine refilled?  You were telling me about lisinopril (pls add this to her med list.) thx. Dr. Ladona Ridgel

## 2020-06-11 NOTE — Progress Notes (Signed)
Error

## 2020-07-01 ENCOUNTER — Other Ambulatory Visit: Payer: Self-pay

## 2020-07-01 ENCOUNTER — Emergency Department (HOSPITAL_COMMUNITY)
Admission: EM | Admit: 2020-07-01 | Discharge: 2020-07-01 | Disposition: A | Discharge status: Home / Self Care | Payer: Self-pay | Attending: Emergency Medicine | Admitting: Emergency Medicine

## 2020-07-01 ENCOUNTER — Encounter (HOSPITAL_COMMUNITY): Payer: Self-pay | Admitting: *Deleted

## 2020-07-01 ENCOUNTER — Emergency Department (HOSPITAL_COMMUNITY): Payer: Self-pay

## 2020-07-01 ENCOUNTER — Ambulatory Visit: Payer: Medicaid Other | Admitting: Family Medicine

## 2020-07-01 DIAGNOSIS — Y929 Unspecified place or not applicable: Secondary | ICD-10-CM | POA: Insufficient documentation

## 2020-07-01 DIAGNOSIS — S93401A Sprain of unspecified ligament of right ankle, initial encounter: Secondary | ICD-10-CM | POA: Insufficient documentation

## 2020-07-01 DIAGNOSIS — W172XXA Fall into hole, initial encounter: Secondary | ICD-10-CM | POA: Insufficient documentation

## 2020-07-01 DIAGNOSIS — Y999 Unspecified external cause status: Secondary | ICD-10-CM | POA: Insufficient documentation

## 2020-07-01 DIAGNOSIS — Y939 Activity, unspecified: Secondary | ICD-10-CM | POA: Insufficient documentation

## 2020-07-01 DIAGNOSIS — Z79899 Other long term (current) drug therapy: Secondary | ICD-10-CM | POA: Insufficient documentation

## 2020-07-01 DIAGNOSIS — X501XXA Overexertion from prolonged static or awkward postures, initial encounter: Secondary | ICD-10-CM | POA: Insufficient documentation

## 2020-07-01 DIAGNOSIS — I10 Essential (primary) hypertension: Secondary | ICD-10-CM | POA: Insufficient documentation

## 2020-07-01 HISTORY — DX: Other adverse food reactions, not elsewhere classified, initial encounter: T78.19XA

## 2020-07-01 HISTORY — DX: Other adverse food reactions, not elsewhere classified, initial encounter: T78.1XXA

## 2020-07-01 MED ORDER — OXYCODONE-ACETAMINOPHEN 5-325 MG PO TABS
1.0000 | ORAL_TABLET | Freq: Once | ORAL | Status: AC
  Administered 2020-07-01: 1 via ORAL
  Filled 2020-07-01: qty 1

## 2020-07-01 MED ORDER — IBUPROFEN 400 MG PO TABS
600.0000 mg | ORAL_TABLET | Freq: Once | ORAL | Status: AC
  Administered 2020-07-01: 600 mg via ORAL
  Filled 2020-07-01: qty 2

## 2020-07-01 NOTE — ED Triage Notes (Signed)
Pt states she fell in a hole this am and states "my knee went one way and my ankle went the other"; pt c/o lower foot pain and states her toes feel numb; pt states she heard a loud "pop"

## 2020-07-05 NOTE — ED Provider Notes (Signed)
[facility] EMERGENCY DEPARTMENT Provider Note   CSN: [id number] Arrival date & time: 07/01/20  1411     History Chief Complaint  Patient presents with  . Ankle Pain    Barbara Robertson is a 29 y.o. female.  HPI   29 year old female with right ankle pain.  Stepped awkwardly earlier today.  She has had persistent right ankle pain since then.  Cannot bear full weight.  Denies any other acute injuries.  Past Medical History:  Diagnosis Date  . ADHD (attention deficit hyperactivity disorder)    not on meds  . Allergic reaction to alpha-gal   . Hypertension   . Pregnancy   . Seizures (HCC)    last seizure 2016    Patient Active Problem List   Diagnosis Date Noted  . IUD (intrauterine device) in place 01/23/2018  . Essential hypertension 04/12/2017  . Severe preeclampsia superimposed on chronic hypertension, antepartum 06/26/2016  . Obesity in pregnancy 09/23/2013  . Rh negative status during pregnancy, antepartum 07/07/2013    Past Surgical History:  Procedure Laterality Date  . NO PAST SURGERIES       OB History    Gravida  3   Para  2   Term  1   Preterm  1   AB  1   Living  2     SAB  1   TAB  0   Ectopic  0   Multiple  0   Live Births  2        Obstetric Comments  No rhogam with SAB in Nov 2016        Family History  Problem Relation Age of Onset  . Arthritis Father   . Cancer Maternal Grandmother   . Diabetes Maternal Grandmother   . Arthritis Mother   . Hypertension Mother     Social History   Tobacco Use  . Smoking status: Never Smoker  . Smokeless tobacco: Never Used  Substance Use Topics  . Alcohol use: No    Alcohol/week: 0.0 standard drinks  . Drug use: No    Home Medications Prior to Admission medications   Medication Sig Start Date End Date Taking? Authorizing Provider  amLODipine (NORVASC) 10 MG tablet TAKE 1 TABLET BY MOUTH EVERYDAY AT BEDTIME 06/11/20   Taylor, Malena M, DO  BLISOVI 24 FE 1-20 MG-MCG(24) tablet  TAKE 1 TABLET BY MOUTH EVERY DAY 01/20/20   Judeth Horn, NP  cyclobenzaprine (FLEXERIL) 10 MG tablet Take 1 tablet (10 mg total) by mouth 3 (three) times daily as needed for muscle spasms. 04/03/18   Campbell Riches, NP  HYDROcodone-acetaminophen (NORCO/VICODIN) 5-325 MG tablet Take 1 tablet by mouth every 4 (four) hours as needed. 04/03/18   Campbell Riches, NP  lisinopril (ZESTRIL) 5 MG tablet Take 5 mg by mouth daily.    [provider]  naproxen (NAPROSYN) 500 MG tablet Take 1 tablet (500 mg total) by mouth 2 (two) times daily with a meal. 04/03/18   Campbell Riches, NP    Allergies    Food and Prednisone  Review of Systems   Review of Systems All systems reviewed and negative, other than as noted in HPI.  Physical Exam Updated Vital Signs BP (!) 135/77   Pulse (!) 107   Temp 98.9 F (37.2 C) (Oral)   Resp 20   Ht 6' (1.829 m)   Wt (!) 119.7 kg   LMP 06/24/2020   SpO2 97%   BMI 35.80 kg/m  Physical Exam Vitals and nursing note reviewed.  Constitutional:      General: She is not in acute distress.    Appearance: She is well-developed.  HENT:     Head: Normocephalic and atraumatic.  Eyes:     General:        Right eye: No discharge.        Left eye: No discharge.     Conjunctiva/sclera: Conjunctivae normal.  Cardiovascular:     Rate and Rhythm: Normal rate and regular rhythm.     Heart sounds: Normal heart sounds. No murmur heard.  No friction rub. No gallop.   Pulmonary:     Effort: Pulmonary effort is normal. No respiratory distress.     Breath sounds: Normal breath sounds.  Abdominal:     General: There is no distension.     Palpations: Abdomen is soft.     Tenderness: There is no abdominal tenderness.  Musculoskeletal:        General: Swelling and tenderness present.     Cervical back: Neck supple.     Comments: Mild swelling the right foot/ankle as compared to the left.  Tenderness to palpation of the talar dome and the lateral malleolus.   Closed injury.  Good DP pulse.  Can range ankle passively although with increased pain.  Skin:    General: Skin is warm and dry.  Neurological:     Mental Status: She is alert.  Psychiatric:        Behavior: Behavior normal.        Thought Content: Thought content normal.     ED Results / Procedures / Treatments   Labs (all labs ordered are listed, but only abnormal results are displayed) Labs Reviewed - No data to display  EKG None  Radiology No results found.   DG Ankle Complete Right  Result Date: 07/01/2020 CLINICAL DATA:  Fall EXAM: RIGHT ANKLE - COMPLETE 3+ VIEW COMPARISON:  None. FINDINGS: There is no evidence of fracture, dislocation, or joint effusion. There is no evidence of arthropathy or other focal bone abnormality. IMPRESSION: No acute fracture. Electronically Signed   By: Guadlupe Spanish M.D.   On: 07/01/2020 15:32     Procedures Procedures (including critical care time)  Medications Ordered in ED Medications  oxyCODONE-acetaminophen (PERCOCET/ROXICET) 5-325 MG per tablet 1 tablet (1 tablet Oral Given 07/01/20 1645)  ibuprofen (ADVIL) tablet 600 mg (600 mg Oral Given 07/01/20 1645)    ED Course  I have reviewed the triage vital signs and the nursing notes.  Pertinent labs & imaging results that were available during my care of the patient were reviewed by me and considered in my medical decision making (see chart for details).    MDM Rules/Calculators/A&P                         29 year old female with right foot/ankle pain.  Negative imaging.  Plan RICE therapy.  Return precautions discussed.  Weightbearing as tolerated. Final Clinical Impression(s) / ED Diagnoses Final diagnoses:  Sprain of right ankle, unspecified ligament, initial encounter    Rx / DC Orders ED Discharge Orders    None       Raeford Razor, MD 07/05/20 2343

## 2020-08-27 ENCOUNTER — Ambulatory Visit
Admission: EM | Admit: 2020-08-27 | Discharge: 2020-08-27 | Disposition: A | Discharge status: Home / Self Care | Payer: Medicaid Other | Attending: Emergency Medicine | Admitting: Emergency Medicine

## 2020-08-27 ENCOUNTER — Other Ambulatory Visit: Payer: Self-pay

## 2020-08-27 DIAGNOSIS — Z1152 Encounter for screening for COVID-19: Secondary | ICD-10-CM | POA: Diagnosis not present

## 2020-08-27 DIAGNOSIS — Z20822 Contact with and (suspected) exposure to covid-19: Secondary | ICD-10-CM

## 2020-08-27 DIAGNOSIS — J069 Acute upper respiratory infection, unspecified: Secondary | ICD-10-CM

## 2020-08-27 MED ORDER — BENZONATATE 100 MG PO CAPS: 100.0000 mg | ORAL_CAPSULE | Freq: Three times a day (TID) | ORAL | 0 refills | Status: DC

## 2020-08-27 MED ORDER — ALBUTEROL SULFATE HFA 108 (90 BASE) MCG/ACT IN AERS
1.0000 | INHALATION_SPRAY | Freq: Four times a day (QID) | RESPIRATORY_TRACT | 0 refills | Status: DC | PRN
Start: 2020-08-27 — End: 2022-03-11

## 2020-08-27 NOTE — ED Provider Notes (Signed)
[facility]   [id number] 08/27/20 Arrival Time: 5284   CC: COVID symptoms  SUBJECTIVE: History from: patient.  Barbara Robertson is a 29 y.o. female who presents with cough x 4 days.  Daughter and husband with similar cough, but tested negative for COVID.  Has tried OTC medications without relief.  Symptoms are made worse at night.  Reports possible COVID infection in January 2020.   Denies fever, chills, sore throat, SOB, wheezing, chest pain, nausea, changes in bowel or bladder habits.    ROS: As per HPI.  All other pertinent ROS negative.     Past Medical History:  Diagnosis Date  . ADHD (attention deficit hyperactivity disorder)    not on meds  . Allergic reaction to alpha-gal   . Hypertension   . Pregnancy   . Seizures (HCC)    last seizure 2016   Past Surgical History:  Procedure Laterality Date  . NO PAST SURGERIES     Allergies  Allergen Reactions  . 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   Reaction:  Seizures   . Prednisone Hives   No current facility-administered medications on file prior to encounter.   Current Outpatient Medications on File Prior to Encounter  Medication Sig Dispense Refill  . amLODipine (NORVASC) 10 MG tablet TAKE 1 TABLET BY MOUTH EVERYDAY AT BEDTIME 30 tablet 0  . BLISOVI 24 FE 1-20 MG-MCG(24) tablet TAKE 1 TABLET BY MOUTH EVERY DAY 84 tablet 3  . lisinopril (ZESTRIL) 5 MG tablet Take 5 mg by mouth daily.     Social History   Socioeconomic History  . Marital status: Married    Spouse name: Not on file  . Number of children: Not on file  . Years of education: Not on file  . Highest education level: Not on file  Occupational History  . Not on file  Tobacco Use  . Smoking status: Never Smoker  . Smokeless tobacco: Never Used  Substance and Sexual Activity  . Alcohol use: No    Alcohol/week: 0.0 standard drinks  . Drug use: No  . Sexual activity: Yes    Partners: Male  Other Topics  Concern  . Not on file  Social History Narrative  . Not on file   Social Determinants of Health   Financial Resource Strain:   . Difficulty of Paying Living Expenses: Not on file  Food Insecurity:   . Worried About Programme researcher, broadcasting/film/video in the Last Year: Not on file  . Ran Out of Food in the Last Year: Not on file  Transportation Needs:   . Lack of Transportation (Medical): Not on file  . Lack of Transportation (Non-Medical): Not on file  Physical Activity:   . Days of Exercise per Week: Not on file  . Minutes of Exercise per Session: Not on file  Stress:   . Feeling of Stress : Not on file  Social Connections:   . Frequency of Communication with Friends and Family: Not on file  . Frequency of Social Gatherings with Friends and Family: Not on file  . Attends Religious Services: Not on file  . Active Member of Clubs or Organizations: Not on file  . Attends Banker Meetings: Not on file  . Marital Status: Not on file  Intimate Partner Violence:   . Fear of Current or Ex-Partner: Not on file  . Emotionally Abused: Not on file  . Physically Abused: Not on file  .  Sexually Abused: Not on file   Family History  Problem Relation Age of Onset  . Arthritis Father   . Cancer Maternal Grandmother   . Diabetes Maternal Grandmother   . Arthritis Mother   . Hypertension Mother     OBJECTIVE:  Vitals:   08/27/20 0835  BP: (!) 138/91  Pulse: (!) 103  Resp: 20  Temp: 98.5 F (36.9 C)  SpO2: 96%     General appearance: alert; appears mildly fatigued, but nontoxic; speaking in full sentences and tolerating own secretions HEENT: NCAT; Ears: EACs clear, TMs pearly gray; Eyes: PERRL.  EOM grossly intact. Nose: nares patent without rhinorrhea, Throat: oropharynx clear, tonsils non erythematous or enlarged, uvula midline  Neck: supple without LAD Lungs: unlabored respirations, symmetrical air entry; cough: mild; no respiratory distress; CTAB Heart: regular rate and  rhythm.  Skin: warm and dry Psychological: alert and cooperative; normal mood and affect   ASSESSMENT & PLAN:  1. Encounter for screening for COVID-19   2. Viral URI with cough   3. Suspected COVID-19 virus infection     Meds ordered this encounter  Medications  . benzonatate (TESSALON) 100 MG capsule    Sig: Take 1 capsule (100 mg total) by mouth every 8 (eight) hours.    Dispense:  21 capsule    Refill:  0    Order Specific Question:   Supervising Provider    Answer:   Eustace Moore [[id number]]  . albuterol (VENTOLIN HFA) 108 (90 Base) MCG/ACT inhaler    Sig: Inhale 1-2 puffs into the lungs every 6 (six) hours as needed for wheezing or shortness of breath.    Dispense:  18 g    Refill:  0    Order Specific Question:   Supervising Provider    Answer:   Eustace Moore [[id number]]    COVID testing ordered.  It will take between 5-7 days for test results.  Someone will contact you regarding abnormal results.    In the meantime: You should remain isolated in your home for 10 days from symptom onset AND greater than 72 hours after symptoms resolution (absence of fever without the use of fever-reducing medication and improvement in respiratory symptoms), whichever is longer Get plenty of rest and push fluids Tessalon Perles prescribed for cough Albuterol inhaler as needed for coughing spasms Use OTC zyrtec for nasal congestion, runny nose, and/or sore throat Use OTC flonase for nasal congestion and runny nose Use medications daily for symptom relief Use OTC medications like ibuprofen or tylenol as needed fever or pain Call or go to the ED if you have any new or worsening symptoms such as fever, worsening cough, shortness of breath, chest tightness, chest pain, turning blue, changes in mental status, etc...   Reviewed expectations re: course of current medical issues. Questions answered. Outlined signs and symptoms indicating need for more acute intervention. Patient  verbalized understanding. After Visit Summary given.         Rennis Harding, PA-C 08/27/20 5643

## 2020-08-27 NOTE — ED Triage Notes (Signed)
Pt presents with c/o cough for past 4 days  ?

## 2020-08-27 NOTE — Discharge Instructions (Signed)

## 2020-08-30 LAB — NOVEL CORONAVIRUS, NAA: SARS-CoV-2, NAA: NOT DETECTED

## 2020-11-09 ENCOUNTER — Encounter: Payer: Self-pay | Admitting: Family Medicine

## 2020-11-09 ENCOUNTER — Ambulatory Visit (INDEPENDENT_AMBULATORY_CARE_PROVIDER_SITE_OTHER): Payer: Medicaid Other | Admitting: Family Medicine

## 2020-11-09 ENCOUNTER — Other Ambulatory Visit: Payer: Self-pay

## 2020-11-09 DIAGNOSIS — R112 Nausea with vomiting, unspecified: Secondary | ICD-10-CM

## 2020-11-09 MED ORDER — ONDANSETRON 4 MG PO TBDP: 4.0000 mg | ORAL_TABLET | Freq: Three times a day (TID) | ORAL | 0 refills | Status: DC | PRN

## 2020-11-09 NOTE — Progress Notes (Signed)
Patient ID: Barbara Robertson, female    DOB: 1991/07/15, 29 y.o.   MRN: [id number]   Chief Complaint  Patient presents with  . Emesis   Subjective:  CC: vomiting and diarrhea x 3 days  This is a new problem.  Diarrhea started 3 days ago, vomiting started last night.  Reports that she vomited approximately 12 times last night, last time was at 02 this morning is able to keep water down and Gatorade.  Feels that she is not dehydrated.  Reports that her stomach was only hurting right before she was going to the bathroom and this was on and off.  Denies bloody vomit or stool.  Denies fever, chills, says that her abdomen just feels bubbly.  diarrhea started 3 days ago. Taking pepto. Started vomiting last night.   Medical History Barbara Robertson has a past medical history of ADHD (attention deficit hyperactivity disorder), Allergic reaction to alpha-gal, Hypertension, Pregnancy, and Seizures (HCC).   Outpatient Encounter Medications as of 11/09/2020  Medication Sig  . albuterol (VENTOLIN HFA) 108 (90 Base) MCG/ACT inhaler Inhale 1-2 puffs into the lungs every 6 (six) hours as needed for wheezing or shortness of breath.  Marland Kitchen amLODipine (NORVASC) 10 MG tablet TAKE 1 TABLET BY MOUTH EVERYDAY AT BEDTIME  . BLISOVI 24 FE 1-20 MG-MCG(24) tablet TAKE 1 TABLET BY MOUTH EVERY DAY  . lisinopril (ZESTRIL) 5 MG tablet Take 5 mg by mouth daily.  . ondansetron (ZOFRAN ODT) 4 MG disintegrating tablet Take 1 tablet (4 mg total) by mouth every 8 (eight) hours as needed for nausea or vomiting.  . [DISCONTINUED] benzonatate (TESSALON) 100 MG capsule Take 1 capsule (100 mg total) by mouth every 8 (eight) hours.   No facility-administered encounter medications on file as of 11/09/2020.     Review of Systems  Constitutional: Negative for chills and fever.  Respiratory: Negative for shortness of breath.   Cardiovascular: Negative for chest pain.  Gastrointestinal: Positive for abdominal pain, diarrhea, nausea and vomiting.  Negative for blood in stool.     Vitals There were no vitals taken for this visit.  Objective:   Physical Exam Constitutional:      General: She is not in acute distress.    Appearance: Normal appearance. She is not toxic-appearing.  HENT:     Right Ear: Tympanic membrane normal.     Left Ear: Tympanic membrane normal.     Nose: Nose normal.     Mouth/Throat:     Mouth: Mucous membranes are moist.     Pharynx: No posterior oropharyngeal erythema.     Tonsils: 1+ on the right. 2+ on the left.  Cardiovascular:     Rate and Rhythm: Normal rate and regular rhythm.     Heart sounds: Normal heart sounds.  Pulmonary:     Effort: Pulmonary effort is normal.     Breath sounds: Normal breath sounds.  Abdominal:     General: Bowel sounds are normal.     Palpations: Abdomen is soft.     Tenderness: There is no abdominal tenderness. There is no guarding or rebound.  Skin:    General: Skin is warm and dry.  Neurological:     Mental Status: She is alert and oriented to person, place, and time.  Psychiatric:        Mood and Affect: Mood normal.        Behavior: Behavior normal.        Thought Content: Thought content normal.  Judgment: Judgment normal.      Assessment and Plan   1. Nausea and vomiting, intractability of vomiting not specified, unspecified vomiting type - ondansetron (ZOFRAN ODT) 4 MG disintegrating tablet; Take 1 tablet (4 mg total) by mouth every 8 (eight) hours as needed for nausea or vomiting.  Dispense: 20 tablet; Refill: 0   It appears that she is recovering from her viral illness.  She has not vomited since 2:00 this morning, and is able to stay hydrated therefore she is appropriate for outpatient treatment.  Zofran sent to assist with the nausea so that she can continue to stay hydrated.  Agrees with plan of care discussed today. Understands warning signs to seek further care: Chest pain, shortness of breath, fever, vomiting, dehydration, blood in the  stool or vomit. Understands to follow-up if symptoms do not improve, or worsen, warning signs discussed concerning abdominal pain, fever, and possible appendicitis, report to the emergency room immediately.  There is a low suspicion today that this is going on, she had a negative abdominal exam.  She declines a Covid test today.   Novella Olive, NP 11/09/2020

## 2021-01-25 ENCOUNTER — Telehealth: Payer: Self-pay

## 2021-01-25 NOTE — Telephone Encounter (Signed)
Refill request received from [facility] for birth control pills. Pt has not been seen in our office since 01/23/18. Pharmacy notified this refill has been declined via fax. Called pt; VM left stating pt will need appt in our office prior to receiving any refills.

## 2021-01-27 ENCOUNTER — Telehealth: Payer: Self-pay | Admitting: Family Medicine

## 2021-01-27 NOTE — Telephone Encounter (Signed)
Started on Sunday having fever, chills, light headness, body aches, slight cough, no sob. Does not feel she needs appt right now.

## 2021-01-27 NOTE — Telephone Encounter (Signed)
Pt contacted and verbalized understanding. Pt states she is leveling off but has lost sense of smell

## 2021-01-27 NOTE — Telephone Encounter (Signed)
Tested positive for Covid yesterday

## 2021-01-27 NOTE — Telephone Encounter (Signed)
Pls call pt and let her know to take vitamins c, d, and zinc.    Covid 19 recommendations- It's symptomatic tx for viral illness. Take mucinex as directed, could change to delsym if needed.  Increase fluid intake. Vit C, vit D and zinc are recommended vitamins to take.  zinc dose of 75 mg-100mg . daily doses of vitamin D3 (1,000-4,000 IU), vitamin C (500 mg), and melatonin (0.3mg -2 mg each night).   Flonase for nasal congestion.  Tylenol or ibuprofen prn.  If not getting better with all this, call back for further evaluation.    Thx. Dr. Ladona Ridgel

## 2021-02-18 DIAGNOSIS — R87610 Atypical squamous cells of undetermined significance on cytologic smear of cervix (ASC-US): Secondary | ICD-10-CM | POA: Diagnosis not present

## 2021-02-18 DIAGNOSIS — Z01419 Encounter for gynecological examination (general) (routine) without abnormal findings: Secondary | ICD-10-CM | POA: Diagnosis not present

## 2021-02-18 DIAGNOSIS — Z0000 Encounter for general adult medical examination without abnormal findings: Secondary | ICD-10-CM | POA: Diagnosis not present

## 2021-02-18 DIAGNOSIS — E079 Disorder of thyroid, unspecified: Secondary | ICD-10-CM | POA: Diagnosis not present

## 2021-04-07 ENCOUNTER — Encounter: Payer: Self-pay | Admitting: Family Medicine

## 2021-04-07 ENCOUNTER — Ambulatory Visit (INDEPENDENT_AMBULATORY_CARE_PROVIDER_SITE_OTHER): Payer: Medicaid Other | Admitting: Family Medicine

## 2021-04-07 ENCOUNTER — Other Ambulatory Visit: Payer: Self-pay

## 2021-04-07 VITALS — BP 139/89 | HR 96 | Temp 97.9°F | Ht 72.0 in | Wt 275.8 lb

## 2021-04-07 DIAGNOSIS — N926 Irregular menstruation, unspecified: Secondary | ICD-10-CM | POA: Diagnosis not present

## 2021-04-07 DIAGNOSIS — I10 Essential (primary) hypertension: Secondary | ICD-10-CM

## 2021-04-07 DIAGNOSIS — R635 Abnormal weight gain: Secondary | ICD-10-CM | POA: Diagnosis not present

## 2021-04-07 DIAGNOSIS — Z3009 Encounter for other general counseling and advice on contraception: Secondary | ICD-10-CM | POA: Diagnosis not present

## 2021-04-07 DIAGNOSIS — Z3041 Encounter for surveillance of contraceptive pills: Secondary | ICD-10-CM

## 2021-04-07 MED ORDER — AMLODIPINE BESYLATE 10 MG PO TABS: ORAL_TABLET | ORAL | 0 refills | Status: DC

## 2021-04-07 MED ORDER — BLISOVI 24 FE 1-20 MG-MCG(24) PO TABS: 1.0000 | ORAL_TABLET | Freq: Every day | ORAL | 0 refills | Status: DC

## 2021-04-07 MED ORDER — LISINOPRIL 10 MG PO TABS: 10.0000 mg | ORAL_TABLET | Freq: Every day | ORAL | 0 refills | Status: DC

## 2021-04-07 NOTE — Progress Notes (Signed)
Patient ID: Barbara Robertson, female    DOB: [birth year], 30 y.o.   MRN: [id number]   Chief Complaint  Patient presents with  . Patient would like switch birth control pills    Patient would also like to discuss weight loss   Subjective:    HPI Pt is on bcp and is wanting to switch. Feeling period is irregular noticing it with last child 4 yrs ago. Having periods too close together. Pap was normal last visit. Last pap with gyn doctor in [place].  Concerns with weight- Wanting to be around 200 lbs at this time is at 275 lbs.   HTN Pt compliant with BP meds.  No SEs Denies chest pain, sob, LE swelling, or blurry vision. Pt taking 65m norvasc and 141mlisinopril.  BP Readings from Last 3 Encounters:  04/07/21 139/89  08/27/20 (!) 138/91  07/01/20 (!) 135/77    Medical History Barbara Robertson has a past medical history of ADHD (attention deficit hyperactivity disorder), Allergic reaction to alpha-gal, Hypertension, Pregnancy, and [place]   Outpatient Encounter Medications as of 04/07/2021  Medication Sig  . albuterol (VENTOLIN HFA) 108 (90 Base) MCG/ACT inhaler Inhale 1-2 puffs into the lungs every 6 (six) hours as needed for wheezing or shortness of breath.  . Marland KitchenmLODipine (NORVASC) 10 MG tablet TAKE 1 TABLET BY MOUTH daily  . lisinopril (ZESTRIL) 10 MG tablet Take 1 tablet (10 mg total) by mouth daily.  . Norethindrone Acetate-Ethinyl Estrad-FE (BLISOVI 24 FE) 1-20 MG-MCG(24) tablet Take 1 tablet by mouth daily.  . ondansetron (ZOFRAN ODT) 4 MG disintegrating tablet Take 1 tablet (4 mg total) by mouth every 8 (eight) hours as needed for nausea or vomiting.  . [DISCONTINUED] amLODipine (NORVASC) 10 MG tablet TAKE 1 TABLET BY MOUTH EVERYDAY AT BEDTIME  . [DISCONTINUED] BLISOVI 24 FE 1-20 MG-MCG(24) tablet TAKE 1 TABLET BY MOUTH EVERY DAY  . [DISCONTINUED] lisinopril (ZESTRIL) 5 MG tablet Take 5 mg by mouth daily.   No facility-administered encounter medications on file as of  04/07/2021.     Review of Systems  Constitutional: Negative for chills and fever.  HENT: Negative for congestion, rhinorrhea and sore throat.   Respiratory: Negative for cough, shortness of breath and wheezing.   Cardiovascular: Negative for chest pain and leg swelling.  Gastrointestinal: Negative for abdominal pain, diarrhea, nausea and vomiting.  Genitourinary: Negative for dysuria and frequency.  Musculoskeletal: Negative for arthralgias and back pain.  Skin: Negative for rash.  Neurological: Negative for dizziness, weakness and headaches.     Vitals BP 139/89   Pulse 96   Temp 97.9 F (36.6 C) (Oral)   Ht 6' (1.829 m)   Wt 275 lb 12.8 oz (125.1 kg)   SpO2 96%   BMI 37.41 kg/m   Objective:   Physical Exam Vitals and nursing note reviewed.  Constitutional:      Appearance: Normal appearance.  HENT:     Head: Normocephalic and atraumatic.     Nose: Nose normal.     Mouth/Throat:     Mouth: Mucous membranes are moist.     Pharynx: Oropharynx is clear.  Eyes:     Extraocular Movements: Extraocular movements intact.     Conjunctiva/sclera: Conjunctivae normal.     Pupils: Pupils are equal, round, and reactive to light.  Cardiovascular:     Rate and Rhythm: Normal rate and regular rhythm.     Pulses: Normal pulses.     Heart sounds: Normal heart sounds.  Pulmonary:  Effort: Pulmonary effort is normal.     Breath sounds: Normal breath sounds. No wheezing, rhonchi or rales.  Musculoskeletal:        General: Normal range of motion.     Right lower leg: No edema.     Left lower leg: No edema.  Skin:    General: Skin is warm and dry.     Findings: No lesion or rash.  Neurological:     General: No focal deficit present.     Mental Status: She is alert and oriented to person, place, and time.  Psychiatric:        Mood and Affect: Mood normal.        Behavior: Behavior normal.      Assessment and Plan   1. Irregular periods/menstrual cycles - Ambulatory  referral to Gynecology - CBC  2. Encounter for other general counseling or advice on contraception - Ambulatory referral to Gynecology  3. Weight gain - CBC - CMP14+EGFR - Lipid panel - TSH  4. Encounter for surveillance of contraceptive pills - Norethindrone Acetate-Ethinyl Estrad-FE (BLISOVI 24 FE) 1-20 MG-MCG(24) tablet; Take 1 tablet by mouth daily.  Dispense: 84 tablet; Refill: 0  5. Essential hypertension - amLODipine (NORVASC) 10 MG tablet; TAKE 1 TABLET BY MOUTH daily  Dispense: 90 tablet; Refill: 0 - lisinopril (ZESTRIL) 10 MG tablet; Take 1 tablet (10 mg total) by mouth daily.  Dispense: 90 tablet; Refill: 0   htn- suboptimal.  Pt encouraged to do low salt diet and increase in exercisng. Cont with norvasc and lisinopril. Inc in lisinopril form 35m to 167m  Gyn- pt given referral for irregular periods and wanting to discuss other forms of birth control.   Weight gain- pt to work on eating lower calorie diet and increase in exercising. Checking labs.     Return in about 6 months (around 10/07/2021) for f/u htn.

## 2021-04-13 LAB — CMP14+EGFR
ALT: 23 IU/L (ref 0–32)
AST: 20 IU/L (ref 0–40)
Albumin/Globulin Ratio: 1.8 (ref 1.2–2.2)
Albumin: 4.6 g/dL (ref 3.9–5.0)
Alkaline Phosphatase: 93 IU/L (ref 44–121)
BUN/Creatinine Ratio: 14 (ref 9–23)
BUN: 9 mg/dL (ref 6–20)
Bilirubin Total: 0.4 mg/dL (ref 0.0–1.2)
CO2: 20 mmol/L (ref 20–29)
Calcium: 9.4 mg/dL (ref 8.7–10.2)
Chloride: 101 mmol/L (ref 96–106)
Creatinine, Ser: 0.66 mg/dL (ref 0.57–1.00)
Globulin, Total: 2.6 g/dL (ref 1.5–4.5)
Glucose: 91 mg/dL (ref 65–99)
Potassium: 4.4 mmol/L (ref 3.5–5.2)
Sodium: 138 mmol/L (ref 134–144)
Total Protein: 7.2 g/dL (ref 6.0–8.5)
eGFR: 122 mL/min/{1.73_m2} (ref >59)

## 2021-04-13 LAB — LIPID PANEL
Chol/HDL Ratio: 2.2 ratio (ref 0.0–4.4)
Cholesterol, Total: 148 mg/dL (ref 100–199)
HDL: 66 mg/dL (ref >39)
LDL Chol Calc (NIH): 55 mg/dL (ref 0–99)
Triglycerides: 164 mg/dL — ABNORMAL HIGH (ref 0–149)
VLDL Cholesterol Cal: 27 mg/dL (ref 5–40)

## 2021-04-13 LAB — CBC
Hematocrit: 42.2 % (ref 34.0–46.6)
Hemoglobin: 14.2 g/dL (ref 11.1–15.9)
MCH: 29.8 pg (ref 26.6–33.0)
MCHC: 33.6 g/dL (ref 31.5–35.7)
MCV: 89 fL (ref 79–97)
Platelets: 410 10*3/uL (ref 150–450)
RBC: 4.76 x10E6/uL (ref 3.77–5.28)
RDW: 12.8 % (ref 11.7–15.4)
WBC: 8.9 10*3/uL (ref 3.4–10.8)

## 2021-04-13 LAB — TSH: TSH: 1.08 u[IU]/mL (ref 0.450–4.500)

## 2021-04-19 ENCOUNTER — Encounter: Payer: Self-pay | Admitting: Family Medicine

## 2021-04-22 ENCOUNTER — Telehealth: Payer: Self-pay | Admitting: Family Medicine

## 2021-04-22 ENCOUNTER — Encounter (HOSPITAL_COMMUNITY): Payer: Self-pay | Admitting: *Deleted

## 2021-04-22 ENCOUNTER — Other Ambulatory Visit: Payer: Self-pay

## 2021-04-22 ENCOUNTER — Emergency Department (HOSPITAL_COMMUNITY)
Admission: EM | Admit: 2021-04-22 | Discharge: 2021-04-22 | Disposition: A | Discharge status: Home / Self Care | Payer: Medicaid Other | Attending: Emergency Medicine | Admitting: Emergency Medicine

## 2021-04-22 DIAGNOSIS — N3000 Acute cystitis without hematuria: Secondary | ICD-10-CM | POA: Insufficient documentation

## 2021-04-22 DIAGNOSIS — I10 Essential (primary) hypertension: Secondary | ICD-10-CM | POA: Insufficient documentation

## 2021-04-22 DIAGNOSIS — Z79899 Other long term (current) drug therapy: Secondary | ICD-10-CM | POA: Insufficient documentation

## 2021-04-22 DIAGNOSIS — R1030 Lower abdominal pain, unspecified: Secondary | ICD-10-CM

## 2021-04-22 DIAGNOSIS — R110 Nausea: Secondary | ICD-10-CM | POA: Insufficient documentation

## 2021-04-22 LAB — URINALYSIS, ROUTINE W REFLEX MICROSCOPIC
Bilirubin Urine: NEGATIVE
Glucose, UA: NEGATIVE mg/dL
Ketones, ur: NEGATIVE mg/dL
Nitrite: NEGATIVE
Protein, ur: NEGATIVE mg/dL
Specific Gravity, Urine: 1.017 (ref 1.005–1.030)
WBC, UA: >50 WBC/hpf — ABNORMAL HIGH (ref 0–5)
pH: 5 (ref 5.0–8.0)

## 2021-04-22 LAB — COMPREHENSIVE METABOLIC PANEL
ALT: 21 U/L (ref 0–44)
AST: 20 U/L (ref 15–41)
Albumin: 3.8 g/dL (ref 3.5–5.0)
Alkaline Phosphatase: 67 U/L (ref 38–126)
Anion gap: 9 (ref 5–15)
BUN: 9 mg/dL (ref 6–20)
CO2: 21 mmol/L — ABNORMAL LOW (ref 22–32)
Calcium: 9.1 mg/dL (ref 8.9–10.3)
Chloride: 106 mmol/L (ref 98–111)
Creatinine, Ser: 0.72 mg/dL (ref 0.44–1.00)
GFR, Estimated: >60 mL/min (ref >60)
Glucose, Bld: 139 mg/dL — ABNORMAL HIGH (ref 70–99)
Potassium: 3.7 mmol/L (ref 3.5–5.1)
Sodium: 136 mmol/L (ref 135–145)
Total Bilirubin: 0.3 mg/dL (ref 0.3–1.2)
Total Protein: 7.2 g/dL (ref 6.5–8.1)

## 2021-04-22 LAB — CBC
HCT: 41.5 % (ref 36.0–46.0)
Hemoglobin: 14.3 g/dL (ref 12.0–15.0)
MCH: 30.5 pg (ref 26.0–34.0)
MCHC: 34.5 g/dL (ref 30.0–36.0)
MCV: 88.5 fL (ref 80.0–100.0)
Platelets: 401 10*3/uL — ABNORMAL HIGH (ref 150–400)
RBC: 4.69 MIL/uL (ref 3.87–5.11)
RDW: 12.6 % (ref 11.5–15.5)
WBC: 9.4 10*3/uL (ref 4.0–10.5)
nRBC: 0 % (ref 0.0–0.2)

## 2021-04-22 LAB — I-STAT BETA HCG BLOOD, ED (MC, WL, AP ONLY): I-stat hCG, quantitative: <5 m[IU]/mL (ref <5)

## 2021-04-22 LAB — LIPASE, BLOOD: Lipase: 27 U/L (ref 11–51)

## 2021-04-22 MED ORDER — CEPHALEXIN 500 MG PO CAPS: 500.0000 mg | ORAL_CAPSULE | Freq: Four times a day (QID) | ORAL | 0 refills | Status: DC

## 2021-04-22 MED ORDER — ONDANSETRON HCL 4 MG PO TABS
4.0000 mg | ORAL_TABLET | Freq: Three times a day (TID) | ORAL | 0 refills | Status: DC | PRN
Start: 2021-04-22 — End: 2021-07-15

## 2021-04-22 NOTE — Telephone Encounter (Signed)
Patient was seen 4/28 and put on birth control and now having stomach pain and nausea for two weeks . She states her period is late also its been two weeks , negative pregnancy test. She wanting to know if she needs a different kind of birth control. Please advise

## 2021-04-22 NOTE — Telephone Encounter (Signed)
Left message to return call 

## 2021-04-22 NOTE — Telephone Encounter (Signed)
Pt states she is at the ED now get blood test for pregnancy due to the fact that all her pregnancies did not show up til she was 6 -8 week and she is rh-.. she states they have done bw and she is waiting for results.

## 2021-04-22 NOTE — Telephone Encounter (Signed)
Noted  

## 2021-04-22 NOTE — Discharge Instructions (Addendum)
You are seen in the emergency department for lower abdominal pain and nausea.  You had a pregnancy test that was negative.  You appear to have some signs of a urinary tract infection so we are putting you on some antibiotics.  We are also giving her prescription for some nausea medicine.  Please drink plenty of fluids.  Follow-up with your doctor.  Return to the emergency department for any worsening or concerning symptoms

## 2021-04-22 NOTE — ED Provider Notes (Signed)
[facility] EMERGENCY DEPARTMENT Provider Note   CSN: [id number] Arrival date & time: 04/22/21  1019     History Chief Complaint  Patient presents with  . Abdominal Pain    Barbara Robertson is a 30 y.o. female.  Presenting with 3 weeks of intermittent lower abdominal pain and nausea.  She had stopped her contraception during that time and is now restarted on it.  She is concerned she may be pregnant because she felt similar to this when she had a early pregnancy.  No fevers chills.  No vomiting or diarrhea.  No real urinary symptoms and no vaginal bleeding or discharge.  The history is provided by the patient.  Abdominal Pain Pain location:  Suprapubic Pain quality: pressure   Pain radiates to:  Does not radiate Pain severity:  Moderate Onset quality:  Gradual Timing:  Intermittent Progression:  Unchanged Chronicity:  Recurrent Context: not sick contacts and not trauma   Relieved by:  Nothing Worsened by:  Nothing Ineffective treatments:  None tried Associated symptoms: nausea   Associated symptoms: no chest pain, no cough, no diarrhea, no dysuria, no fever, no hematemesis, no hematochezia, no hematuria, no shortness of breath, no sore throat, no vaginal bleeding, no vaginal discharge and no vomiting        Past Medical History:  Diagnosis Date  . ADHD (attention deficit hyperactivity disorder)    not on meds  . Allergic reaction to alpha-gal   . Hypertension   . Pregnancy   . Seizures (HCC)    last seizure 2016    Patient Active Problem List   Diagnosis Date Noted  . Nausea and vomiting 11/09/2020  . IUD (intrauterine device) in place 01/23/2018  . Essential hypertension 04/12/2017  . Severe preeclampsia superimposed on chronic hypertension, antepartum 06/26/2016  . Obesity in pregnancy 09/23/2013  . Rh negative status during pregnancy, antepartum 07/07/2013    Past Surgical History:  Procedure Laterality Date  . NO PAST SURGERIES       OB  History    Gravida  3   Para  2   Term  1   Preterm  1   AB  1   Living  2     SAB  1   IAB  0   Ectopic  0   Multiple  0   Live Births  2        Obstetric Comments  No rhogam with SAB in Nov 2016        Family History  Problem Relation Age of Onset  . Arthritis Father   . Cancer Maternal Grandmother   . Diabetes Maternal Grandmother   . Arthritis Mother   . Hypertension Mother     Social History   Tobacco Use  . Smoking status: Never Smoker  . Smokeless tobacco: Never Used  Substance Use Topics  . Alcohol use: No    Alcohol/week: 0.0 standard drinks  . Drug use: No    Home Medications Prior to Admission medications   Medication Sig Start Date End Date Taking? Authorizing Provider  albuterol (VENTOLIN HFA) 108 (90 Base) MCG/ACT inhaler Inhale 1-2 puffs into the lungs every 6 (six) hours as needed for wheezing or shortness of breath. 08/27/20   Wurst, [place], PA-C  amLODipine (NORVASC) 10 MG tablet TAKE 1 TABLET BY MOUTH daily 04/07/21   Ladona Ridgel, Malena M, DO  lisinopril (ZESTRIL) 10 MG tablet Take 1 tablet (10 mg total) by mouth daily. 04/07/21   Ladona Ridgel,  Malena M, DO  Norethindrone Acetate-Ethinyl Estrad-FE (BLISOVI 24 FE) 1-20 MG-MCG(24) tablet Take 1 tablet by mouth daily. 04/07/21   Ladona Ridgel, Malena M, DO  ondansetron (ZOFRAN ODT) 4 MG disintegrating tablet Take 1 tablet (4 mg total) by mouth every 8 (eight) hours as needed for nausea or vomiting. 11/09/20   Novella Olive, NP    Allergies    Food and Prednisone  Review of Systems   Review of Systems  Constitutional: Negative for fever.  HENT: Negative for sore throat.   Eyes: Negative for visual disturbance.  Respiratory: Negative for cough and shortness of breath.   Cardiovascular: Negative for chest pain.  Gastrointestinal: Positive for abdominal pain and nausea. Negative for diarrhea, hematemesis, hematochezia and vomiting.  Genitourinary: Negative for dysuria, hematuria, vaginal bleeding and  vaginal discharge.  Musculoskeletal: Negative for neck pain.  Skin: Negative for rash.  Neurological: Negative for headaches.    Physical Exam Updated Vital Signs BP (!) 146/89   Pulse 93   Temp 99.4 F (37.4 C) (Oral)   Resp 16   Ht 6' (1.829 m)   Wt 117.9 kg   SpO2 97%   BMI 35.26 kg/m   Physical Exam Vitals and nursing note reviewed.  Constitutional:      General: She is not in acute distress.    Appearance: Normal appearance. She is well-developed.  HENT:     Head: Normocephalic and atraumatic.  Eyes:     Conjunctiva/sclera: Conjunctivae normal.  Cardiovascular:     Rate and Rhythm: Normal rate and regular rhythm.     Heart sounds: No murmur heard.   Pulmonary:     Effort: Pulmonary effort is normal. No respiratory distress.     Breath sounds: Normal breath sounds.  Abdominal:     Palpations: Abdomen is soft.     Tenderness: There is no abdominal tenderness.  Musculoskeletal:        General: No deformity or signs of injury. Normal range of motion.     Cervical back: Neck supple.  Skin:    General: Skin is warm and dry.  Neurological:     General: No focal deficit present.     Mental Status: She is alert.     ED Results / Procedures / Treatments   Labs (all labs ordered are listed, but only abnormal results are displayed) Labs Reviewed  COMPREHENSIVE METABOLIC PANEL - Abnormal; Notable for the following components:      Result Value   CO2 21 (*)    Glucose, Bld 139 (*)    All other components within normal limits  CBC - Abnormal; Notable for the following components:   Platelets 401 (*)    All other components within normal limits  URINALYSIS, ROUTINE W REFLEX MICROSCOPIC - Abnormal; Notable for the following components:   APPearance HAZY (*)    Hgb urine dipstick SMALL (*)    Leukocytes,Ua LARGE (*)    WBC, UA >50 (*)    Bacteria, UA FEW (*)    All other components within normal limits  LIPASE, BLOOD  I-STAT BETA HCG BLOOD, ED ([facility], [facility], [facility]  ONLY)    EKG None  Radiology No results found.  Procedures Procedures   Medications Ordered in ED Medications - No data to display  ED Course  I have reviewed the triage vital signs and the nursing notes.  Pertinent labs & imaging results that were available during my care of the patient were reviewed by me and considered in my medical  decision making (see chart for details).    MDM Rules/Calculators/A&P                         Patient here for concerned that she may be pregnant despite having 2 negative pregnancy tests at home.  Her symptoms of her low abdominal pain and her nausea may be related to the urinary tract infection she appears to have.  She is comfortable plan for discharge on antibiotics and nausea medication and PCP follow-up.  Return instructions discussed  Final Clinical Impression(s) / ED Diagnoses Final diagnoses:  Lower abdominal pain  Acute cystitis without hematuria    Rx / DC Orders ED Discharge Orders    None       Terrilee Files, MD 04/23/21 1223

## 2021-04-22 NOTE — ED Triage Notes (Signed)
C/o nausea x 3 weeks c/o lower abd. Pain this am. States her LMO was 3/14 , had taken 2 home preg. Test that were both neg.

## 2021-04-22 NOTE — Telephone Encounter (Signed)
Any missed pills? I can change the pill to see if another will work better with less side effects.

## 2021-04-27 ENCOUNTER — Encounter: Payer: Medicaid Other | Admitting: Obstetrics and Gynecology

## 2021-06-30 ENCOUNTER — Other Ambulatory Visit: Payer: Self-pay | Admitting: Family Medicine

## 2021-06-30 ENCOUNTER — Telehealth: Payer: Self-pay | Admitting: Family Medicine

## 2021-06-30 DIAGNOSIS — Z3041 Encounter for surveillance of contraceptive pills: Secondary | ICD-10-CM

## 2021-06-30 DIAGNOSIS — I10 Essential (primary) hypertension: Secondary | ICD-10-CM

## 2021-06-30 NOTE — Telephone Encounter (Signed)
Please advise. Thank you

## 2021-06-30 NOTE — Telephone Encounter (Signed)
Patient is requesting refill on birth-control norethinrone acetate-ethinyl-estrad-Fe 1-20mg -mcg(24) [facility]

## 2021-07-01 MED ORDER — LISINOPRIL 10 MG PO TABS: 10.0000 mg | ORAL_TABLET | Freq: Every day | ORAL | 0 refills | Status: DC

## 2021-07-01 MED ORDER — AMLODIPINE BESYLATE 10 MG PO TABS: ORAL_TABLET | ORAL | 0 refills | Status: DC

## 2021-07-01 MED ORDER — BLISOVI 24 FE 1-20 MG-MCG(24) PO TABS: 1.0000 | ORAL_TABLET | Freq: Every day | ORAL | 1 refills | Status: DC

## 2021-07-01 NOTE — Telephone Encounter (Signed)
   Yes pls give 84 and 1 refill. Thx. Dr. Ladona Ridgel

## 2021-07-01 NOTE — Telephone Encounter (Signed)
Prescription sent electronically to pharmacy. Left message to return call to notify patient. 

## 2021-07-01 NOTE — Telephone Encounter (Signed)
Patient notified

## 2021-07-15 ENCOUNTER — Other Ambulatory Visit: Payer: Self-pay

## 2021-07-15 ENCOUNTER — Telehealth: Payer: Self-pay | Admitting: Family Medicine

## 2021-07-15 ENCOUNTER — Ambulatory Visit (INDEPENDENT_AMBULATORY_CARE_PROVIDER_SITE_OTHER): Payer: Self-pay | Admitting: Family Medicine

## 2021-07-15 VITALS — BP 126/84 | HR 68 | Temp 98.1°F | Ht 72.0 in | Wt 271.4 lb

## 2021-07-15 DIAGNOSIS — I10 Essential (primary) hypertension: Secondary | ICD-10-CM

## 2021-07-15 MED ORDER — AMLODIPINE BESYLATE 10 MG PO TABS: ORAL_TABLET | ORAL | 0 refills | Status: DC

## 2021-07-15 MED ORDER — LISINOPRIL 10 MG PO TABS: 10.0000 mg | ORAL_TABLET | Freq: Every day | ORAL | 0 refills | Status: DC

## 2021-07-15 NOTE — Telephone Encounter (Signed)
Patient seen in office today and has Medicaid Family Planning. 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 Her ID number is [id number]. Patient hasn't been receiving a bill and wants to know what services her insurance will cover. Patient wants to know if she should pay the bill or have it sent to insurance. Please advise at [phone].

## 2021-07-15 NOTE — Progress Notes (Signed)
Patient ID: Barbara Robertson, female    DOB: [birth year], 30 y.o.   MRN: [id number]   Chief Complaint  Patient presents with   Hypertension    Follow up   Subjective:    HPI Cc- htn f/u Only took bp meds about 15 mins prior to the appt for her bp.  HTN Pt compliant with BP meds.  No SEs Denies chest pain, sob, LE swelling, or blurry vision.  Well woman exam- Was with gyn in [facility] and has appt to see them for wellness appt  Had one in 1/22 in [place]. Normal pap per pt.   Medical History Devanie has a past medical history of ADHD (attention deficit hyperactivity disorder), Allergic reaction to alpha-gal, Hypertension, Pregnancy, and Seizures (HCC).   Outpatient Encounter Medications as of 07/15/2021  Medication Sig   albuterol (VENTOLIN HFA) 108 (90 Base) MCG/ACT inhaler Inhale 1-2 puffs into the lungs every 6 (six) hours as needed for wheezing or shortness of breath.   [START ON 09/10/2021] amLODipine (NORVASC) 10 MG tablet TAKE 1 TABLET BY MOUTH daily   [START ON 09/10/2021] lisinopril (ZESTRIL) 10 MG tablet Take 1 tablet (10 mg total) by mouth daily.   Norethindrone Acetate-Ethinyl Estrad-FE (BLISOVI 24 FE) 1-20 MG-MCG(24) tablet Take 1 tablet by mouth daily.   ondansetron (ZOFRAN ODT) 4 MG disintegrating tablet Take 1 tablet (4 mg total) by mouth every 8 (eight) hours as needed for nausea or vomiting.   [DISCONTINUED] amLODipine (NORVASC) 10 MG tablet TAKE 1 TABLET BY MOUTH daily   [DISCONTINUED] cephALEXin (KEFLEX) 500 MG capsule Take 1 capsule (500 mg total) by mouth 4 (four) times daily.   [DISCONTINUED] lisinopril (ZESTRIL) 10 MG tablet Take 1 tablet (10 mg total) by mouth daily.   [DISCONTINUED] ondansetron (ZOFRAN) 4 MG tablet Take 1 tablet (4 mg total) by mouth every 8 (eight) hours as needed for nausea or vomiting.   No facility-administered encounter medications on file as of 07/15/2021.     Review of Systems  Constitutional:  Negative for chills and fever.  HENT:   Negative for congestion, rhinorrhea and sore throat.   Respiratory:  Negative for cough, shortness of breath and wheezing.   Cardiovascular:  Negative for chest pain and leg swelling.  Gastrointestinal:  Negative for abdominal pain, diarrhea, nausea and vomiting.  Genitourinary:  Negative for dysuria and frequency.  Musculoskeletal:  Negative for arthralgias and back pain.  Skin:  Negative for rash.  Neurological:  Negative for dizziness, weakness and headaches.    Vitals BP 126/84   Pulse 68   Temp 98.1 F (36.7 C) (Oral)   Ht 6' (1.829 m)   Wt 271 lb 6.4 oz (123.1 kg)   SpO2 99%   BMI 36.81 kg/m   Objective:   Physical Exam Vitals and nursing note reviewed.  Constitutional:      General: She is not in acute distress.    Appearance: Normal appearance. She is obese. She is not ill-appearing.  HENT:     Head: Normocephalic and atraumatic.  Cardiovascular:     Rate and Rhythm: Normal rate and regular rhythm.     Pulses: Normal pulses.     Heart sounds: Normal heart sounds. No murmur heard. Pulmonary:     Effort: Pulmonary effort is normal.     Breath sounds: Normal breath sounds. No wheezing, rhonchi or rales.  Musculoskeletal:        General: Normal range of motion.     Right lower leg: No edema.  Left lower leg: No edema.  Skin:    General: Skin is warm and dry.     Findings: No lesion or rash.  Neurological:     General: No focal deficit present.     Mental Status: She is alert and oriented to person, place, and time.     Cranial Nerves: No cranial nerve deficit.  Psychiatric:        Mood and Affect: Mood normal.        Behavior: Behavior normal.        Thought Content: Thought content normal.        Judgment: Judgment normal.     Assessment and Plan   1. Essential hypertension - amLODipine (NORVASC) 10 MG tablet; TAKE 1 TABLET BY MOUTH daily  Dispense: 90 tablet; Refill: 0 - lisinopril (ZESTRIL) 10 MG tablet; Take 1 tablet (10 mg total) by mouth daily.   Dispense: 90 tablet; Refill: 0   Htn- suboptimal.  Dec salt in diet and cont to take meds daily. Pt just had meds prior to appt.   No follow-ups on file.

## 2021-07-18 NOTE — Telephone Encounter (Signed)
I am sending this question to our billing department.

## 2021-08-23 ENCOUNTER — Ambulatory Visit: Payer: Medicaid Other | Admitting: Adult Health

## 2021-10-04 ENCOUNTER — Encounter: Payer: Medicaid Other | Admitting: Adult Health

## 2021-10-07 ENCOUNTER — Other Ambulatory Visit: Payer: Self-pay | Admitting: Family Medicine

## 2021-10-07 DIAGNOSIS — I10 Essential (primary) hypertension: Secondary | ICD-10-CM

## 2021-11-01 ENCOUNTER — Encounter: Payer: Medicaid Other | Admitting: Adult Health

## 2021-12-11 NOTE — L&D Delivery Note (Signed)
Delivery Note 31 y.o. [id number] at [redacted]w[redacted]d admitted for IOL due to chronic HTN in pregnancy. She was started on pitocin, had AROM and progressed to full cervical dilation.  At 2:55 AM a viable female was delivered via Vaginal, Spontaneous (Presentation: Right Occiput Anterior).  APGAR: 9, 9; weight  TBD   Placenta status: Spontaneous, Intact.  Cord: 3 vessels with the following complications: None.  Cord pH: not collected.  Anesthesia: None Episiotomy: None Lacerations: None, perineal abrasions Suture Repair:  none required Est. Blood Loss (mL): 102  Mom to postpartum.  Baby to Couplet care / Skin to Skin.  Sheppard Evens MD MPH OB Fellow, [facility], Center for [facility] 10/23/2022

## 2021-12-19 ENCOUNTER — Encounter: Payer: Medicaid Other | Admitting: Adult Health

## 2022-01-16 ENCOUNTER — Encounter: Payer: Medicaid Other | Admitting: Adult Health

## 2022-01-24 ENCOUNTER — Ambulatory Visit (INDEPENDENT_AMBULATORY_CARE_PROVIDER_SITE_OTHER): Payer: Medicaid Other | Admitting: Adult Health

## 2022-01-24 ENCOUNTER — Encounter: Payer: Self-pay | Admitting: Adult Health

## 2022-01-24 ENCOUNTER — Other Ambulatory Visit: Payer: Self-pay

## 2022-01-24 ENCOUNTER — Other Ambulatory Visit (HOSPITAL_COMMUNITY)
Admission: RE | Admit: 2022-01-24 | Discharge: 2022-01-24 | Disposition: A | Discharge status: Home / Self Care | Payer: Medicaid Other | Source: Ambulatory Visit | Attending: Adult Health | Admitting: Adult Health

## 2022-01-24 VITALS — BP 137/87 | HR 101 | Ht 72.0 in | Wt 269.2 lb

## 2022-01-24 DIAGNOSIS — Z01419 Encounter for gynecological examination (general) (routine) without abnormal findings: Secondary | ICD-10-CM | POA: Diagnosis present

## 2022-01-24 DIAGNOSIS — Z3009 Encounter for other general counseling and advice on contraception: Secondary | ICD-10-CM | POA: Insufficient documentation

## 2022-01-24 DIAGNOSIS — Z113 Encounter for screening for infections with a predominantly sexual mode of transmission: Secondary | ICD-10-CM | POA: Insufficient documentation

## 2022-01-24 DIAGNOSIS — Z3041 Encounter for surveillance of contraceptive pills: Secondary | ICD-10-CM

## 2022-01-24 DIAGNOSIS — I10 Essential (primary) hypertension: Secondary | ICD-10-CM

## 2022-01-24 MED ORDER — BLISOVI 24 FE 1-20 MG-MCG(24) PO TABS: 1.0000 | ORAL_TABLET | Freq: Every day | ORAL | 1 refills | Status: DC

## 2022-01-24 NOTE — Progress Notes (Signed)
Patient ID: Barbara Robertson, female   DOB: 28-Jul-1991, 31 y.o.   MRN: [id number] History of Present Illness: Barbara Robertson is a 31 year old white female,married, [id number], in for a well woman gyn exam and pap.  She is a new pt.  She is currently on COCs but may want another baby. She had preeclampsia with last pregnancy and bleeding after delivery. She says she was told to not have any more children.  PCP is Dr Adriana Simas.    Current Medications, Allergies, Past Medical History, Past Surgical History, Family History and Social History were reviewed in [employer] record.     Review of Systems: Patient denies any headaches, hearing loss, fatigue, blurred vision, shortness of breath, chest pain, abdominal pain, problems with bowel movements, urination, or intercourse. No joint pain or mood swings.     Physical Exam:BP 137/87 (BP Location: Right Arm, Patient Position: Sitting, Cuff Size: Large)    Pulse (!) 101    Ht 6' (1.829 m)    Wt 269 lb 3.2 oz (122.1 kg)    LMP 12/25/2021 (Exact Date)    BMI 36.51 kg/m   General:  Well developed, well nourished, no acute distress Skin:  Warm and dry Neck:  Midline trachea, normal thyroid, good ROM, no lymphadenopathy Lungs; Clear to auscultation bilaterally Breast:  No dominant palpable mass, retraction, or nipple discharge Cardiovascular: Regular rate and rhythm Abdomen:  Soft, non tender, no hepatosplenomegaly Pelvic:  External genitalia is normal in appearance, no lesions.  The vagina is normal in appearance,pink discharge without odor.  Urethra has no lesions or masses. The cervix is bulbous.Pap with GC/CHL and HR HPV genotyping performed.  Uterus is felt to be normal size, shape, and contour.  No adnexal masses or tenderness noted.Bladder is non tender, no masses felt. Rectal is deferred.  Extremities/musculoskeletal:  No swelling or varicosities noted, no clubbing or cyanosis Psych:  No mood changes, alert and cooperative,seems happy AA is  1 Fall risk is low Depression screen [facility] 2/9 01/24/2022 07/15/2021 04/07/2021  Decreased Interest 0 0 0  Down, Depressed, Hopeless 0 0 0  PHQ - 2 Score 0 0 0  Altered sleeping 1 - -  Tired, decreased energy 1 - -  Change in appetite 0 - -  Feeling bad or failure about yourself  0 - -  Trouble concentrating 1 - -  Moving slowly or fidgety/restless 0 - -  Suicidal thoughts 0 - -  PHQ-9 Score 3 - -  Difficult doing work/chores - - -    GAD 7 : Generalized Anxiety Score 01/24/2022 11/25/2019 01/23/2018 11/08/2016  Nervous, Anxious, on Edge 1 2 0 0  Control/stop worrying 1 3 0 0  Worry too much - different things 1 3 0 0  Trouble relaxing 1 3 0 0  Restless 1 3 0 0  Easily annoyed or irritable 1 1 0 0  Afraid - awful might happen 1 0 0 0  Total GAD 7 Score 7 15 0 0  Anxiety Difficulty - Not difficult at all - -    Upstream - 01/24/22 3664       Pregnancy Intention Screening   Does the patient want to become pregnant in the next year? Unsure    Does the patient's partner want to become pregnant in the next year? Unsure    Would the patient like to discuss contraceptive options today? No      Contraception Wrap Up   Current Method Oral Contraceptive  End Method Oral Contraceptive    Contraception Counseling Provided No              Examination chaperoned by Tish RN.  Impression and Plan: 1. Encounter for gynecological examination with Papanicolaou smear of cervix Pap sent Physical in 1 year Pap in 3 years if norma;  - Cytology - PAP  2. Family planning Will check labs HIV and RPR - HIV Antibody (routine testing w rflx) - RPR - Cytology - PAP  3. Screening examination for STD (sexually transmitted disease) Will check labs HIV and RPR - HIV Antibody (routine testing w rflx) - RPR  4. Encounter for surveillance of contraceptive pills Will continue COCs for now   Meds ordered this encounter  Medications   Norethindrone Acetate-Ethinyl Estrad-FE (BLISOVI 24 FE)  1-20 MG-MCG(24) tablet    Sig: Take 1 tablet by mouth daily.    Dispense:  84 tablet    Refill:  1    Order Specific Question:   Supervising Provider    Answer:   Lazaro Arms [[id number]]    But if talks with husband and wants to get pregnant, will stop OCs, start PNV and change BP meds   5. Essential hypertension

## 2022-01-25 LAB — HIV ANTIBODY (ROUTINE TESTING W REFLEX): HIV Screen 4th Generation wRfx: NONREACTIVE

## 2022-01-25 LAB — RPR: RPR Ser Ql: NONREACTIVE

## 2022-01-26 LAB — CYTOLOGY - PAP
Chlamydia: NEGATIVE
Comment: NEGATIVE
Comment: NEGATIVE
Comment: NORMAL
Diagnosis: NEGATIVE
High risk HPV: NEGATIVE
Neisseria Gonorrhea: NEGATIVE

## 2022-02-01 ENCOUNTER — Other Ambulatory Visit: Payer: Self-pay | Admitting: Adult Health

## 2022-02-02 ENCOUNTER — Other Ambulatory Visit: Payer: Self-pay | Admitting: Adult Health

## 2022-02-02 DIAGNOSIS — I10 Essential (primary) hypertension: Secondary | ICD-10-CM

## 2022-02-02 MED ORDER — AMLODIPINE BESYLATE 10 MG PO TABS: ORAL_TABLET | ORAL | 1 refills | Status: DC

## 2022-02-02 NOTE — Progress Notes (Signed)
Refilled norvasc and stopped lisinopril, recheck BP in 2 weeks, trying to get pregnant

## 2022-02-15 ENCOUNTER — Encounter: Payer: Self-pay | Admitting: *Deleted

## 2022-02-15 ENCOUNTER — Ambulatory Visit (INDEPENDENT_AMBULATORY_CARE_PROVIDER_SITE_OTHER): Payer: Self-pay | Admitting: *Deleted

## 2022-02-15 ENCOUNTER — Other Ambulatory Visit: Payer: Self-pay

## 2022-02-15 VITALS — BP 147/95 | HR 110

## 2022-02-15 DIAGNOSIS — Z0130 Encounter for examination of blood pressure without abnormal findings: Secondary | ICD-10-CM

## 2022-02-15 NOTE — Progress Notes (Signed)
? ?  NURSE VISIT- BLOOD PRESSURE CHECK ? ?SUBJECTIVE:  ?Barbara Robertson is a 31 y.o. [phone] female here for BP check. She is a GYN patient   ? ?HYPERTENSION ROS: ? ?GYN patient: ?Taking medicines as instructed yes ?Headaches  Yes; goes away with Tylenol ?Chest pain NO ?Shortness of breath No ?Swelling in legs/ankles No ? ?OBJECTIVE:  ?BP (!) 147/95 (BP Location: Right Arm, Patient Position: Sitting, Cuff Size: Large)   Pulse (!) 110   LMP 01/25/2022 (Approximate)   ?Appearance alert, well appearing, and in no distress. ? ?ASSESSMENT: ?GYN  blood pressure check ? ?PLAN: ?Discussed with Cyril Mourning, AGNP   ?Recommendations:  recheck BP here in 4 weeks with nurse.    ?Follow-up: in 4 weeks  ? ?Barbara Robertson  ?02/15/2022 ?10:04 AM ? ?

## 2022-02-28 ENCOUNTER — Encounter: Payer: Self-pay | Admitting: Obstetrics & Gynecology

## 2022-02-28 ENCOUNTER — Other Ambulatory Visit: Payer: Self-pay | Admitting: Adult Health

## 2022-02-28 ENCOUNTER — Ambulatory Visit (INDEPENDENT_AMBULATORY_CARE_PROVIDER_SITE_OTHER): Payer: Self-pay | Admitting: *Deleted

## 2022-02-28 ENCOUNTER — Other Ambulatory Visit: Payer: Self-pay

## 2022-02-28 VITALS — BP 140/87 | HR 114 | Ht 72.0 in | Wt 278.0 lb

## 2022-02-28 DIAGNOSIS — N926 Irregular menstruation, unspecified: Secondary | ICD-10-CM

## 2022-02-28 DIAGNOSIS — Z3201 Encounter for pregnancy test, result positive: Secondary | ICD-10-CM

## 2022-02-28 LAB — POCT URINE PREGNANCY: Preg Test, Ur: POSITIVE — AB

## 2022-02-28 MED ORDER — LABETALOL HCL 100 MG PO TABS: 100.0000 mg | ORAL_TABLET | Freq: Two times a day (BID) | ORAL | 3 refills | Status: DC

## 2022-02-28 NOTE — Progress Notes (Signed)
? ?  NURSE VISIT- PREGNANCY CONFIRMATION  ? ?SUBJECTIVE:  ?Barbara Robertson is a 31 y.o. [phone] female at [redacted]w[redacted]d by certain LMP of Patient's last menstrual period was 01/28/2022. Here for pregnancy confirmation.  Home pregnancy test: positive x 2   She reports nausea.  She is taking prenatal vitamins.   ? ?OBJECTIVE:  ?BP 140/87 (BP Location: Right Arm, Patient Position: Sitting, Cuff Size: Large)   Pulse (!) 114   Ht 6' (1.829 m)   Wt 278 lb (126.1 kg)   LMP 01/28/2022   BMI 37.70 kg/m?   ?Appears well, in no apparent distress ? ?Results for orders placed or performed in visit on 02/28/22 (from the past 24 hour(s))  ?POCT urine pregnancy  ? Collection Time: 02/28/22  9:54 AM  ?Result Value Ref Range  ? Preg Test, Ur Positive (A) Negative  ? ? ?ASSESSMENT: ?Positive pregnancy test, [redacted]w[redacted]d by LMP   ? ?PLAN: ?Schedule for dating ultrasound in 4 weeks ?Prenatal vitamins: continue   ?Nausea medicines: not currently needed   ?Discussed with Roseanne Reno, NP and will change Norvasc to Labetalol ?OB packet given: Yes ? ?Debbe Odea Kowen Kluth  ?02/28/2022 ?10:04 AM ? ?

## 2022-02-28 NOTE — Progress Notes (Signed)
Will stp norvasc and start labetalol ? ?

## 2022-03-09 ENCOUNTER — Telehealth: Payer: Self-pay | Admitting: *Deleted

## 2022-03-09 NOTE — Telephone Encounter (Signed)
Patient states woke up this morning and went to the bathroom and noticed some mucous and "streaks of light blood" when she wiped.  Had intercourse night before last.  Denies severe cramping.  Informed patient some spotting after intercourse can occur however, if she developed continued bleeding or severe cramping, to let us know.  Since it was just one time, to continue to monitor.  Pt verbalized understanding and agreeable to plan.  ?

## 2022-03-11 ENCOUNTER — Encounter (HOSPITAL_COMMUNITY): Payer: Self-pay | Admitting: *Deleted

## 2022-03-11 ENCOUNTER — Emergency Department (HOSPITAL_COMMUNITY): Payer: Medicaid Other

## 2022-03-11 ENCOUNTER — Other Ambulatory Visit: Payer: Self-pay

## 2022-03-11 ENCOUNTER — Emergency Department (HOSPITAL_COMMUNITY)
Admission: EM | Admit: 2022-03-11 | Discharge: 2022-03-11 | Disposition: A | Discharge status: Home / Self Care | Payer: Medicaid Other | Attending: Emergency Medicine | Admitting: Emergency Medicine

## 2022-03-11 DIAGNOSIS — N9489 Other specified conditions associated with female genital organs and menstrual cycle: Secondary | ICD-10-CM | POA: Diagnosis not present

## 2022-03-11 DIAGNOSIS — Z3A01 Less than 8 weeks gestation of pregnancy: Secondary | ICD-10-CM | POA: Insufficient documentation

## 2022-03-11 DIAGNOSIS — Z79899 Other long term (current) drug therapy: Secondary | ICD-10-CM | POA: Diagnosis not present

## 2022-03-11 DIAGNOSIS — O209 Hemorrhage in early pregnancy, unspecified: Secondary | ICD-10-CM | POA: Diagnosis not present

## 2022-03-11 DIAGNOSIS — D75839 Thrombocytosis, unspecified: Secondary | ICD-10-CM | POA: Insufficient documentation

## 2022-03-11 DIAGNOSIS — O4690 Antepartum hemorrhage, unspecified, unspecified trimester: Secondary | ICD-10-CM

## 2022-03-11 LAB — URINALYSIS, ROUTINE W REFLEX MICROSCOPIC
Bilirubin Urine: NEGATIVE
Glucose, UA: NEGATIVE mg/dL
Ketones, ur: NEGATIVE mg/dL
Nitrite: NEGATIVE
Protein, ur: NEGATIVE mg/dL
Specific Gravity, Urine: 1.005 (ref 1.005–1.030)
pH: 6 (ref 5.0–8.0)

## 2022-03-11 LAB — COMPREHENSIVE METABOLIC PANEL
ALT: 21 U/L (ref 0–44)
AST: 18 U/L (ref 15–41)
Albumin: 4.2 g/dL (ref 3.5–5.0)
Alkaline Phosphatase: 75 U/L (ref 38–126)
Anion gap: 9 (ref 5–15)
BUN: 10 mg/dL (ref 6–20)
CO2: 23 mmol/L (ref 22–32)
Calcium: 9.1 mg/dL (ref 8.9–10.3)
Chloride: 105 mmol/L (ref 98–111)
Creatinine, Ser: 0.61 mg/dL (ref 0.44–1.00)
GFR, Estimated: >60 mL/min (ref >60)
Glucose, Bld: 94 mg/dL (ref 70–99)
Potassium: 4.2 mmol/L (ref 3.5–5.1)
Sodium: 137 mmol/L (ref 135–145)
Total Bilirubin: 0.3 mg/dL (ref 0.3–1.2)
Total Protein: 7.9 g/dL (ref 6.5–8.1)

## 2022-03-11 LAB — WET PREP, GENITAL
Clue Cells Wet Prep HPF POC: NONE SEEN
Sperm: NONE SEEN
Trich, Wet Prep: NONE SEEN
Yeast Wet Prep HPF POC: NONE SEEN

## 2022-03-11 LAB — ABO/RH: ABO/RH(D): A NEG

## 2022-03-11 LAB — CBC WITH DIFFERENTIAL/PLATELET
Abs Immature Granulocytes: 0.03 10*3/uL (ref 0.00–0.07)
Basophils Absolute: 0 10*3/uL (ref 0.0–0.1)
Basophils Relative: 0 %
Eosinophils Absolute: 0.1 10*3/uL (ref 0.0–0.5)
Eosinophils Relative: 1 %
HCT: 43 % (ref 36.0–46.0)
Hemoglobin: 14 g/dL (ref 12.0–15.0)
Immature Granulocytes: 0 %
Lymphocytes Relative: 26 %
Lymphs Abs: 2.5 10*3/uL (ref 0.7–4.0)
MCH: 28.9 pg (ref 26.0–34.0)
MCHC: 32.6 g/dL (ref 30.0–36.0)
MCV: 88.8 fL (ref 80.0–100.0)
Monocytes Absolute: 0.5 10*3/uL (ref 0.1–1.0)
Monocytes Relative: 5 %
Neutro Abs: 6.3 10*3/uL (ref 1.7–7.7)
Neutrophils Relative %: 68 %
Platelets: 416 10*3/uL — ABNORMAL HIGH (ref 150–400)
RBC: 4.84 MIL/uL (ref 3.87–5.11)
RDW: 12.9 % (ref 11.5–15.5)
WBC: 9.5 10*3/uL (ref 4.0–10.5)
nRBC: 0 % (ref 0.0–0.2)

## 2022-03-11 LAB — HCG, QUANTITATIVE, PREGNANCY: hCG, Beta Chain, Quant, S: 17803 m[IU]/mL — ABNORMAL HIGH (ref <5)

## 2022-03-11 MED ORDER — RHO D IMMUNE GLOBULIN 1500 UNIT/2ML IJ SOSY: 300.0000 ug | PREFILLED_SYRINGE | Freq: Once | INTRAMUSCULAR | Status: DC

## 2022-03-11 MED ORDER — RHO D IMMUNE GLOBULIN 1500 UNIT/2ML IJ SOSY
300.0000 ug | PREFILLED_SYRINGE | Freq: Once | INTRAMUSCULAR | Status: AC
  Administered 2022-03-11: 300 ug via INTRAVENOUS

## 2022-03-11 NOTE — Discharge Instructions (Addendum)
Please follow pelvic rest precautions.  Please do not have any sexual contact or insert anything into your vagina.  Please use pads to catch her bleeding and monitor, if she were bleeding and how often you are changing a pad. ?If you develop fevers, you get much heavier bleeding, or you have any new or concerning symptoms I would recommend going to [facility] to the [facility].  Please use entrance C to avoid the emergency room.  ? ?Please make sure you are taking your blood pressure medications.  ?

## 2022-03-11 NOTE — ED Provider Notes (Signed)
I assumed care of patient at shift change, please see note from previous team for full H&P. ?Briefly patient is here for evaluation of vaginal bleeding in the setting of pregnancy. ? ?At the time of shift change ultrasound is pending. ? ?Medical Decision Making ?At the time of shift change plan is to follow-up on ultrasound.  RhoGAM has already been ordered.  And labs except for hCG quant have all resulted. ? ?I discussed ultrasound results with patient.  Recommended pelvic rest until she is able to follow-up with OB/GYN. ?She states that she lives essentially equidistant between [place] and [facility] or wounds hospitalist, I recommended that if she needs additional emergency medical care for this she consider going to the [facility]. ?She states her understanding.   ? ?At the time of discharge patient is hemodynamically stable and overall generally well-appearing. ? ?Return precautions were discussed with patient who states their understanding.  At the time of discharge patient denied any unaddressed complaints or concerns.  Patient is agreeable for discharge home. ? ?Note: Portions of this report may have been transcribed using voice recognition software. Every effort was made to ensure accuracy; however, inadvertent computerized transcription errors may be present ? ? ? ? ?Amount and/or Complexity of Data Reviewed ?Labs: ordered. ?   Details: hCG is elevated at 17,800 consistent with pregnancy. ?Radiology: ordered. ?   Details: Ultrasound shows intrauterine gestational yolk sac measuring about 5 weeks 6 days, fetal pole is not visualized however this was felt to be secondary to early gestation. ? ?Risk ?Prescription drug management. ? ? ? ?  ?Cristina Gong, [place] ?03/12/22 1337 ? ?  ?Sloan Leiter, DO ?03/13/22 0121 ? ?

## 2022-03-11 NOTE — ED Triage Notes (Signed)
Pt believes she is [redacted] wks pregnant, started to have some vaginal bleeding starting today and has passed a clot.  Hx of miscarriages.  ?

## 2022-03-11 NOTE — ED Provider Notes (Signed)
?[facility] ?Provider Note ? ? ?CSN: [id number] ?Arrival date & time: 03/11/22  1659 ? ?  ? ?History ? ?Chief Complaint  ?Patient presents with  ? Vaginal Bleeding  ? ? ?Barbara Robertson is a 31 y.o. female. ? ? ?Vaginal Bleeding ? ?Patient is a 31 year old female roughly [redacted] weeks gestation presenting today due to vaginal bleeding x1 hour.  Started as small amounts of blood, she noticed bright red blood in an hour ago as well as a clot and was concerned.  States she has a history of miscarriages previously.  She not having any abdominal pain, no nausea, no vomiting.  Documented urine pregnancy but has not had pelvic ultrasound or confirmatory IUP ? ?Home Medications ?Prior to Admission medications   ?Medication Sig Start Date End Date Taking? Authorizing Provider  ?labetalol (NORMODYNE) 100 MG tablet Take 1 tablet (100 mg total) by mouth 2 (two) times daily. 02/28/22  Yes Adline Potter, NP  ?Prenatal Vit-Fe Fumarate-FA (PRENATAL VITAMIN PO) Take 1 tablet by mouth daily.   Yes [provider]  ?Probiotic CHEW Chew 1 tablet by mouth daily.   Yes [provider]  ?   ? ?Allergies    ?Alpha-gal, Food, and Prednisone   ? ?Review of Systems   ?Review of Systems  ?Genitourinary:  Positive for vaginal bleeding.  ? ?Physical Exam ?Updated Vital Signs ?BP (!) 146/88 (BP Location: Right Arm)   Pulse (!) 102   Temp 98.5 ?F (36.9 ?C) (Oral)   Resp 14   Ht 6' (1.829 m)   Wt 120.2 kg   LMP 01/28/2022   SpO2 98%   BMI 35.94 kg/m?  ?Physical Exam ?Vitals and nursing note reviewed. Exam conducted with a chaperone present.  ?Constitutional:   ?   Appearance: Normal appearance.  ?HENT:  ?   Head: Normocephalic and atraumatic.  ?Eyes:  ?   General: No scleral icterus.    ?   Right eye: No discharge.     ?   Left eye: No discharge.  ?   Extraocular Movements: Extraocular movements intact.  ?   Pupils: Pupils are equal, round, and reactive to light.  ?Cardiovascular:  ?   Rate and Rhythm:  Normal rate and regular rhythm.  ?   Pulses: Normal pulses.  ?   Heart sounds: Normal heart sounds. No murmur heard. ?  No friction rub. No gallop.  ?Pulmonary:  ?   Effort: Pulmonary effort is normal. No respiratory distress.  ?   Breath sounds: Normal breath sounds.  ?Abdominal:  ?   General: Abdomen is flat. Bowel sounds are normal. There is no distension.  ?   Palpations: Abdomen is soft.  ?   Tenderness: There is no abdominal tenderness.  ?   Comments: Abdomen is soft, no focal tenderness.  ?Genitourinary: ?   Comments: Trace bright red blood in cervical os, no pooling.  Os is closed ?Skin: ?   General: Skin is warm and dry.  ?   Coloration: Skin is not jaundiced.  ?Neurological:  ?   Mental Status: She is alert. Mental status is at baseline.  ?   Coordination: Coordination normal.  ? ? ?ED Results / Procedures / Treatments   ?Labs ?(all labs ordered are listed, but only abnormal results are displayed) ?Labs Reviewed  ?WET PREP, GENITAL - Abnormal; Notable for the following components:  ?    Result Value  ? WBC, Wet Prep HPF POC RARE (*)   ?  All other components within normal limits  ?CBC WITH DIFFERENTIAL/PLATELET - Abnormal; Notable for the following components:  ? Platelets 416 (*)   ? All other components within normal limits  ?URINALYSIS, ROUTINE W REFLEX MICROSCOPIC - Abnormal; Notable for the following components:  ? Hgb urine dipstick LARGE (*)   ? Leukocytes,Ua TRACE (*)   ? Bacteria, UA RARE (*)   ? All other components within normal limits  ?URINE CULTURE  ?COMPREHENSIVE METABOLIC PANEL  ?HCG, QUANTITATIVE, PREGNANCY  ?ABO/RH  ?GC/CHLAMYDIA PROBE AMP ([place]) NOT AT [facility]  ? ? ?EKG ?None ? ?Radiology ?No results found. ? ?Procedures ?Procedures  ? ? ?Medications Ordered in ED ?Medications  ?rho (d) immune globulin (RHIG/RHOPHYLAC) injection 300 mcg (has no administration in time range)  ? ? ?ED Course/ Medical Decision Making/ A&P ?  ?                        ?Medical Decision Making ?Amount  and/or Complexity of Data Reviewed ?Labs: ordered. ?Radiology: ordered. ? ?Risk ?Prescription drug management. ? ? ?This patient presents to the ED for concern of vaginal bleeding, this involves an extensive number of treatment options, and is a complaint that carries with it a high risk of complications and morbidity.  The differential diagnosis includes but not limited to ectopic, threatened abortion ? ?Patient?s presentation is complicated by their history of urine pregnancy without confirmed IUP. ? ?Additional history obtained:  ? ?Reviewed medical record, patient had a confirmed urine pregnancy in the office a few weeks ago. ? ?  ?Lab Tests: ? ?I ordered, viewed, and personally interpreted labs.  The pertinent results include: CBC shows slight increase in platelets but no anemia or leukocytosis. ?CMP does not show any gross electrolyte derangement or AKI. ?A p.o. negative. ?Wet prep notable for white blood cells but rare, ?GC chlamydia pending. ?hCG pending  ? ?  ?Imaging Studies ordered: ? ?I ordered pelvic ultrasound to evaluate for IUP confirmation versus ectopic versus alternative etiology of the vaginal bleeding.  Pending at time of shift change. ? ?I agree with the radiologist interpretation ?  ? ?ECG/Cardiac monitoring:  ? ? ?The patient was maintained on a cardiac monitor.  Visualized monitor strip which showed mild sinus tachycardia with a heart rate of 102 per my interpretation.  ? ? ?Medicines ordered and prescription drug management: ? ?I ordered medication including: rhogan   ? ?I have reviewed the patients home medicines and have made adjustments as needed ? ?Pelvic shows slight vaginal bleeding but no brisk bleed or copious amounts of pooling in the vaginal canal.  Cervical os is closed currently.   ? ?Will obtain pelvic ultrasound to evaluate for confirmed IUP versus alternative etiology of the vaginal bleeding.  Ultrasound is pending at the time of shift change.  Please see [facility] [place] note for final disposition.  I suspect patient will likely be appropriate for close outpatient follow-up and hCG trending with her OB if confirmed IUP. ? ? ? ? ? ? ? ? ?Final Clinical Impression(s) / ED Diagnoses ?Final diagnoses:  ?None  ? ? ?Rx / DC Orders ?ED Discharge Orders   ? ? None  ? ?  ? ? ?  ?Theron Arista, PA-C ?03/11/22 2979 ? ?  ?Sloan Leiter, DO ?03/13/22 0121 ? ?

## 2022-03-13 LAB — GC/CHLAMYDIA PROBE AMP (~~LOC~~) NOT AT ARMC
Chlamydia: NEGATIVE
Comment: NEGATIVE
Comment: NORMAL
Neisseria Gonorrhea: NEGATIVE

## 2022-03-14 LAB — RH IG WORKUP (INCLUDES ABO/RH)
Antibody Screen: NEGATIVE
Gestational Age(Wks): 6
Unit division: 0

## 2022-03-14 LAB — URINE CULTURE

## 2022-03-15 ENCOUNTER — Ambulatory Visit (INDEPENDENT_AMBULATORY_CARE_PROVIDER_SITE_OTHER): Payer: Medicaid Other | Admitting: *Deleted

## 2022-03-15 VITALS — BP 145/80 | HR 96

## 2022-03-15 DIAGNOSIS — O209 Hemorrhage in early pregnancy, unspecified: Secondary | ICD-10-CM | POA: Diagnosis not present

## 2022-03-15 NOTE — Progress Notes (Signed)
? ?  NURSE VISIT- BLOOD PRESSURE CHECK ? ?SUBJECTIVE:  ?Barbara Robertson is a 31 y.o. [phone] female here for BP check. She is [redacted]w[redacted]d pregnant   ? ?HYPERTENSION ROS: ? ?Pregnant/postpartum:  ?Severe headaches that don't go away with tylenol/other medicines: No  ?Visual changes (seeing spots/double/blurred vision) No  ?Severe pain under right breast breast or in center of upper chest No  ?Severe nausea/vomiting No  ?Taking medicines as instructed yes ? ? ?OBJECTIVE:  ?BP (!) 145/80 (BP Location: Left Arm, Patient Position: Sitting, Cuff Size: Normal)   Pulse 96   LMP 01/28/2022   ?Appearance alert, well appearing, and in no distress. ? ?ASSESSMENT: ?Pregnancy [redacted]w[redacted]d  blood pressure check ? ?PLAN: ?Discussed with Derrek Monaco, AGNP   ?Recommendations: no changes needed   ?Follow-up: as scheduled  ? ?Janece Canterbury  ?03/15/2022 ?8:51 AM  ?

## 2022-03-16 LAB — BETA HCG QUANT (REF LAB): hCG Quant: 29156 m[IU]/mL

## 2022-03-27 ENCOUNTER — Other Ambulatory Visit: Payer: Self-pay | Admitting: Obstetrics & Gynecology

## 2022-03-27 DIAGNOSIS — O3680X0 Pregnancy with inconclusive fetal viability, not applicable or unspecified: Secondary | ICD-10-CM

## 2022-03-28 ENCOUNTER — Ambulatory Visit (INDEPENDENT_AMBULATORY_CARE_PROVIDER_SITE_OTHER): Payer: Medicaid Other

## 2022-03-28 DIAGNOSIS — O3680X0 Pregnancy with inconclusive fetal viability, not applicable or unspecified: Secondary | ICD-10-CM

## 2022-03-28 DIAGNOSIS — Z3A08 8 weeks gestation of pregnancy: Secondary | ICD-10-CM

## 2022-03-28 NOTE — Progress Notes (Signed)
[place] 8+3 wks,single IUP with YS,FHR 177 bpm,normal ovaries,CRL 18.07 mm ?

## 2022-04-03 MED ORDER — OMEPRAZOLE 20 MG PO CPDR: 20.0000 mg | DELAYED_RELEASE_CAPSULE | Freq: Every day | ORAL | 6 refills | Status: DC

## 2022-04-27 ENCOUNTER — Other Ambulatory Visit: Payer: Self-pay | Admitting: Obstetrics & Gynecology

## 2022-04-27 DIAGNOSIS — Z3682 Encounter for antenatal screening for nuchal translucency: Secondary | ICD-10-CM

## 2022-04-28 ENCOUNTER — Encounter: Payer: Self-pay | Admitting: Women's Health

## 2022-04-28 DIAGNOSIS — O26899 Other specified pregnancy related conditions, unspecified trimester: Secondary | ICD-10-CM | POA: Insufficient documentation

## 2022-04-28 DIAGNOSIS — O0990 Supervision of high risk pregnancy, unspecified, unspecified trimester: Secondary | ICD-10-CM | POA: Insufficient documentation

## 2022-05-01 ENCOUNTER — Ambulatory Visit (INDEPENDENT_AMBULATORY_CARE_PROVIDER_SITE_OTHER): Payer: Medicaid Other | Admitting: Women's Health

## 2022-05-01 ENCOUNTER — Ambulatory Visit: Payer: Medicaid Other | Admitting: *Deleted

## 2022-05-01 ENCOUNTER — Encounter: Payer: Self-pay | Admitting: Women's Health

## 2022-05-01 ENCOUNTER — Ambulatory Visit (INDEPENDENT_AMBULATORY_CARE_PROVIDER_SITE_OTHER): Payer: Medicaid Other

## 2022-05-01 VITALS — BP 135/81 | HR 82 | Ht 71.0 in | Wt 286.0 lb

## 2022-05-01 DIAGNOSIS — Z3481 Encounter for supervision of other normal pregnancy, first trimester: Secondary | ICD-10-CM | POA: Diagnosis not present

## 2022-05-01 DIAGNOSIS — Z6837 Body mass index (BMI) 37.0-37.9, adult: Secondary | ICD-10-CM

## 2022-05-01 DIAGNOSIS — O10919 Unspecified pre-existing hypertension complicating pregnancy, unspecified trimester: Secondary | ICD-10-CM

## 2022-05-01 DIAGNOSIS — Z3682 Encounter for antenatal screening for nuchal translucency: Secondary | ICD-10-CM

## 2022-05-01 DIAGNOSIS — O0992 Supervision of high risk pregnancy, unspecified, second trimester: Secondary | ICD-10-CM

## 2022-05-01 DIAGNOSIS — I10 Essential (primary) hypertension: Secondary | ICD-10-CM

## 2022-05-01 DIAGNOSIS — Z3A13 13 weeks gestation of pregnancy: Secondary | ICD-10-CM

## 2022-05-01 DIAGNOSIS — Z87898 Personal history of other specified conditions: Secondary | ICD-10-CM | POA: Diagnosis not present

## 2022-05-01 DIAGNOSIS — Z6791 Unspecified blood type, Rh negative: Secondary | ICD-10-CM

## 2022-05-01 DIAGNOSIS — O0991 Supervision of high risk pregnancy, unspecified, first trimester: Secondary | ICD-10-CM | POA: Diagnosis not present

## 2022-05-01 DIAGNOSIS — O09299 Supervision of pregnancy with other poor reproductive or obstetric history, unspecified trimester: Secondary | ICD-10-CM | POA: Diagnosis not present

## 2022-05-01 DIAGNOSIS — O26899 Other specified pregnancy related conditions, unspecified trimester: Secondary | ICD-10-CM

## 2022-05-01 LAB — POCT URINALYSIS DIPSTICK OB
Blood, UA: NEGATIVE
Glucose, UA: NEGATIVE
Ketones, UA: NEGATIVE
Leukocytes, UA: NEGATIVE
Nitrite, UA: NEGATIVE
POC,PROTEIN,UA: NEGATIVE

## 2022-05-01 MED ORDER — BLOOD PRESSURE MONITOR MISC: 0 refills | Status: AC

## 2022-05-01 MED ORDER — ASPIRIN 81 MG PO TBEC: 162.0000 mg | DELAYED_RELEASE_TABLET | Freq: Every day | ORAL | 2 refills | Status: DC

## 2022-05-01 NOTE — Progress Notes (Addendum)
[place] 13+2 wks,measurements c/w dates,CRL 68.88 mm,normal ovaries,NB present,NT 1.4 mm,posterior placenta,FHR 157 bpm

## 2022-05-01 NOTE — Progress Notes (Signed)
INITIAL OBSTETRICAL VISIT Patient name: Barbara Robertson MRN [id number]  Date of birth: 10-23-1991 Chief Complaint:   Initial Prenatal Visit  History of Present Illness:   Barbara Robertson is a 31 y.o. [phone] Caucasian female at [redacted]w[redacted]d by LMP c/w u/s at 8 weeks with an Estimated Date of Delivery: 11/04/22 being seen today for her initial obstetrical visit.   Patient's last menstrual period was 01/28/2022. Her obstetrical history is significant for  term SVB (w/ GHTN) x 1, SAB x 1, 36.6wk SVB after IOL for [facility] w/ superimposed severe pre-e .   CHTN on labetalol 100mg  BID H/O seizures, last 2001, stopped meds 2003 Today she reports  some n/v, declines meds. Reflux, takes prilosec which helps .  Last pap 01/24/22. Results were: NILM w/ HRHPV negative     05/01/2022    9:48 AM 01/24/2022    9:44 AM 07/15/2021    9:01 AM 04/07/2021    2:57 PM 11/25/2019    8:40 AM  Depression screen PHQ 2/9  Decreased Interest 1 0 0 0 0  Down, Depressed, Hopeless 0 0 0 0 0  PHQ - 2 Score 1 0 0 0 0  Altered sleeping 1 1   3   Tired, decreased energy 1 1   1   Change in appetite 1 0   0  Feeling bad or failure about yourself  0 0   0  Trouble concentrating 0 1   0  Moving slowly or fidgety/restless 0 0   0  Suicidal thoughts 0 0   0  PHQ-9 Score 4 3   4   Difficult doing work/chores     Not difficult at all        05/01/2022    9:49 AM 01/24/2022    9:44 AM 11/25/2019    8:38 AM 01/23/2018    5:43 PM  GAD 7 : Generalized Anxiety Score  Nervous, Anxious, on Edge 0 1 2 0  Control/stop worrying 1 1 3  0  Worry too much - different things 1 1 3  0  Trouble relaxing 1 1 3  0  Restless 0 1 3 0  Easily annoyed or irritable 0 1 1 0  Afraid - awful might happen 0 1 0 0  Total GAD 7 Score 3 7 15  0  Anxiety Difficulty   Not difficult at all      Review of Systems:   Pertinent items are noted in HPI Denies cramping/contractions, leakage of fluid, vaginal bleeding, abnormal vaginal discharge w/  itching/odor/irritation, headaches, visual changes, shortness of breath, chest pain, abdominal pain, severe nausea/vomiting, or problems with urination or bowel movements unless otherwise stated above.  Pertinent History Reviewed:  Reviewed past medical,surgical, social, obstetrical and family history.  Reviewed problem list, medications and allergies. OB History  Gravida Para Term Preterm AB Living  4 2 1 1 1 2   SAB IAB Ectopic Multiple Live Births  1 0 0 0 2    # Outcome Date GA Lbr Len/2nd Weight Sex Delivery Anes PTL Lv  4 Current           3 Preterm 09/26/16 [redacted]w[redacted]d 01:16 / 00:02 7 lb 10.8 oz (3.48 kg) F Vag-Spont None N LIV     Complications: Preeclampsia, Chronic hypertension  2 SAB 11/05/15          1 Term 01/30/14 [redacted]w[redacted]d  6 lb 3.7 oz (2.825 kg) F Vag-Spont EPI N LIV     Complications: Gestational hypertension    Obstetric Comments  No rhogam with SAB in Nov 2016   Physical Assessment:   Vitals:   05/01/22 0916 05/01/22 1039  BP: 135/81   Pulse: 82   Weight: 286 lb (129.7 kg)   Height:  5\' 11"  (1.803 m)  Body mass index is 39.89 kg/m.       Physical Examination:  General appearance - well appearing, and in no distress  Mental status - alert, oriented to person, place, and time  Psych:  She has a normal mood and affect  Skin - warm and dry, normal color, no suspicious lesions noted  Chest - effort normal, all lung fields clear to auscultation bilaterally  Heart - normal rate and regular rhythm  Abdomen - soft, nontender  Extremities:  No swelling or varicosities noted  Thin prep pap is not done   Chaperone: N/A    TODAY'S NT [place] 13+2 wks,measurements c/w dates,CRL 68.88 mm,normal ovaries,NB present,NT 1.4 mm,posterior placenta,FHR 157 bpm  Results for orders placed or performed in visit on 05/01/22 (from the past 24 hour(s))  POC Urinalysis Dipstick OB   Collection Time: 05/01/22 10:13 AM  Result Value Ref Range   Color, UA     Clarity, UA     Glucose, UA  Negative Negative   Bilirubin, UA     Ketones, UA neg    Spec Grav, UA     Blood, UA neg    pH, UA     POC,PROTEIN,UA Negative Negative, Trace, Small (1+), Moderate (2+), Large (3+), 4+   Urobilinogen, UA     Nitrite, UA neg    Leukocytes, UA Negative Negative   Appearance     Odor      Assessment & Plan:  1) High-Risk Pregnancy [id number] at [redacted]w[redacted]d with an Estimated Date of Delivery: 11/04/22   2) Initial OB visit  3) CHTN w/ h/o superimposed severe pre-e> continue labetalol 100mg  BID, start ASA 162mg , baseline labs today  4) H/O seizures> last 2001, stopped meds 2003  Meds:  Meds ordered this encounter  Medications   Blood Pressure Monitor MISC    Sig: For regular home bp monitoring during pregnancy    Dispense:  1 each    Refill:  0    O09.91 Please mail to patient Needs large cuff please   aspirin EC 81 MG tablet    Sig: Take 2 tablets (162 mg total) by mouth daily. Swallow whole.    Dispense:  180 tablet    Refill:  2    Order Specific Question:   Supervising Provider    Answer:   Tania Ade H [[id number]]    Initial labs obtained Continue prenatal vitamins Reviewed n/v relief measures and warning s/s to report Reviewed recommended weight gain based on pre-gravid BMI Encouraged well-balanced diet Genetic & carrier screening discussed: requests Panorama and NT/IT, declines Horizon  Ultrasound discussed; fetal survey: requested [place] completed> form faxed if has or is planning to apply for medicaid The nature of [facility] for [employer] with multiple MDs and other Advanced Practice Providers was explained to patient; also emphasized that fellows, residents, and students are part of our team. Does not have home bp cuff. Office bp cuff given: no. Rx sent: yes. Check bp weekly, let us know if consistently >140/90.   Follow-up: Return in about 3 weeks (around 05/22/2022) for HROB, MD or CNM, in person, 2nd IT.   Orders Placed This Encounter  Procedures    Urine Culture   GC/Chlamydia Probe Amp  Integrated 1   Protein / creatinine ratio, urine   Genetic Screening   CBC/D/Plt+RPR+Rh+ABO+RubIgG...   Comprehensive metabolic panel   Hemoglobin A1c   POC Urinalysis Dipstick OB    Roma Schanz CNM, [facility] 05/01/2022 10:56 AM

## 2022-05-01 NOTE — Patient Instructions (Signed)
Crystin, thank you for choosing our office today! We appreciate the opportunity to meet your healthcare needs. You may receive a short survey by mail, e-mail, or through [employer]. If you are happy with your care we would appreciate if you could take just a few minutes to complete the survey questions. We read all of your comments and take your feedback very seriously. Thank you again for choosing our office.  [facility] for [employer] [facility] at [facility]  [facility] at [facility] ([address] [place], [place] [address]) Entrance C, located off of [address] Free 24/7 valet parking   Nausea & Vomiting Have saltine crackers or pretzels by your bed and eat a few bites before you raise your head out of bed in the morning Eat small frequent meals throughout the day instead of large meals Drink plenty of fluids throughout the day to stay hydrated, just don't drink a lot of fluids with your meals.  This can make your stomach fill up faster making you feel sick Do not brush your teeth right after you eat Products with real ginger are good for nausea, like ginger ale and ginger hard candy Make sure it says made with real ginger! Sucking on sour candy like lemon heads is also good for nausea If your prenatal vitamins make you nauseated, take them at night so you will sleep through the nausea Sea Bands If you feel like you need medicine for the nausea & vomiting please let us know If you are unable to keep any fluids or food down please let us know   Constipation Drink plenty of fluid, preferably water, throughout the day Eat foods high in fiber such as fruits, vegetables, and grains Exercise, such as walking, is a good way to keep your bowels regular Drink warm fluids, especially warm prune juice, or decaf coffee Eat a 1/2 cup of real oatmeal (not instant), 1/2 cup applesauce, and 1/2-1 cup warm prune juice every day If needed, you may take Colace (docusate sodium) stool softener  once or twice a day to help keep the stool soft.  If you still are having problems with constipation, you may take Miralax once daily as needed to help keep your bowels regular.   Home Blood Pressure Monitoring for Patients   Your provider has recommended that you check your blood pressure (BP) at least once a week at home. If you do not have a blood pressure cuff at home, one will be provided for you. Contact your provider if you have not received your monitor within 1 week.   Helpful Tips for Accurate Home Blood Pressure Checks  Don't smoke, exercise, or drink caffeine 30 minutes before checking your BP Use the restroom before checking your BP (a full bladder can raise your pressure) Relax in a comfortable upright chair Feet on the ground Left arm resting comfortably on a flat surface at the level of your heart Legs uncrossed Back supported Sit quietly and don't talk Place the cuff on your bare arm Adjust snuggly, so that only two fingertips can fit between your skin and the top of the cuff Check 2 readings separated by at least one minute Keep a log of your BP readings For a visual, please reference this diagram: http://ccnc.care/bpdiagram  Provider Name: [facility]     Phone: [phone]  Zone 1: ALL CLEAR  Continue to monitor your symptoms:  BP reading is less than 140 (top number) or less than 90 (bottom  number)  No right upper stomach pain No headaches or seeing spots No feeling nauseated or throwing up No swelling in face and hands  Zone 2: CAUTION Call your doctor's office for any of the following:  BP reading is greater than 140 (top number) or greater than 90 (bottom number)  Stomach pain under your ribs in the middle or right side Headaches or seeing spots Feeling nauseated or throwing up Swelling in face and hands  Zone 3: EMERGENCY  Seek immediate medical care if you have any of the following:  BP reading is greater than160 (top number) or greater than  110 (bottom number) Severe headaches not improving with Tylenol Serious difficulty catching your breath Any worsening symptoms from Zone 2    First Trimester of Pregnancy The first trimester of pregnancy is from week 1 until the end of week 12 (months 1 through 3). A week after a sperm fertilizes an egg, the egg will implant on the wall of the uterus. This embryo will begin to develop into a baby. Genes from you and your partner are forming the baby. The female genes determine whether the baby is a boy or a girl. At 6-8 weeks, the eyes and face are formed, and the heartbeat can be seen on ultrasound. At the end of 12 weeks, all the baby's organs are formed.  Now that you are pregnant, you will want to do everything you can to have a healthy baby. Two of the most important things are to get good prenatal care and to follow your health care provider's instructions. Prenatal care is all the medical care you receive before the baby's birth. This care will help prevent, find, and treat any problems during the pregnancy and childbirth. BODY CHANGES Your body goes through many changes during pregnancy. The changes vary from woman to woman.  You may gain or lose a couple of pounds at first. You may feel sick to your stomach (nauseous) and throw up (vomit). If the vomiting is uncontrollable, call your health care provider. You may tire easily. You may develop headaches that can be relieved by medicines approved by your health care provider. You may urinate more often. Painful urination may mean you have a bladder infection. You may develop heartburn as a result of your pregnancy. You may develop constipation because certain hormones are causing the muscles that push waste through your intestines to slow down. You may develop hemorrhoids or swollen, bulging veins (varicose veins). Your breasts may begin to grow larger and become tender. Your nipples may stick out more, and the tissue that surrounds them  (areola) may become darker. Your gums may bleed and may be sensitive to brushing and flossing. Dark spots or blotches (chloasma, mask of pregnancy) may develop on your face. This will likely fade after the baby is born. Your menstrual periods will stop. You may have a loss of appetite. You may develop cravings for certain kinds of food. You may have changes in your emotions from day to day, such as being excited to be pregnant or being concerned that something may go wrong with the pregnancy and baby. You may have more vivid and strange dreams. You may have changes in your hair. These can include thickening of your hair, rapid growth, and changes in texture. Some women also have hair loss during or after pregnancy, or hair that feels dry or thin. Your hair will most likely return to normal after your baby is born. WHAT TO EXPECT AT YOUR PRENATAL  VISITS During a routine prenatal visit: You will be weighed to make sure you and the baby are growing normally. Your blood pressure will be taken. Your abdomen will be measured to track your baby's growth. The fetal heartbeat will be listened to starting around week 10 or 12 of your pregnancy. Test results from any previous visits will be discussed. Your health care provider may ask you: How you are feeling. If you are feeling the baby move. If you have had any abnormal symptoms, such as leaking fluid, bleeding, severe headaches, or abdominal cramping. If you have any questions. Other tests that may be performed during your first trimester include: Blood tests to find your blood type and to check for the presence of any previous infections. They will also be used to check for low iron levels (anemia) and Rh antibodies. Later in the pregnancy, blood tests for diabetes will be done along with other tests if problems develop. Urine tests to check for infections, diabetes, or protein in the urine. An ultrasound to confirm the proper growth and development  of the baby. An amniocentesis to check for possible genetic problems. Fetal screens for spina bifida and Down syndrome. You may need other tests to make sure you and the baby are doing well. HOME CARE INSTRUCTIONS  Medicines Follow your health care provider's instructions regarding medicine use. Specific medicines may be either safe or unsafe to take during pregnancy. Take your prenatal vitamins as directed. If you develop constipation, try taking a stool softener if your health care provider approves. Diet Eat regular, well-balanced meals. Choose a variety of foods, such as meat or vegetable-based protein, fish, milk and low-fat dairy products, vegetables, fruits, and whole grain breads and cereals. Your health care provider will help you determine the amount of weight gain that is right for you. Avoid raw meat and uncooked cheese. These carry germs that can cause birth defects in the baby. Eating four or five small meals rather than three large meals a day may help relieve nausea and vomiting. If you start to feel nauseous, eating a few soda crackers can be helpful. Drinking liquids between meals instead of during meals also seems to help nausea and vomiting. If you develop constipation, eat more high-fiber foods, such as fresh vegetables or fruit and whole grains. Drink enough fluids to keep your urine clear or pale yellow. Activity and Exercise Exercise only as directed by your health care provider. Exercising will help you: Control your weight. Stay in shape. Be prepared for labor and delivery. Experiencing pain or cramping in the lower abdomen or low back is a good sign that you should stop exercising. Check with your health care provider before continuing normal exercises. Try to avoid standing for long periods of time. Move your legs often if you must stand in one place for a long time. Avoid heavy lifting. Wear low-heeled shoes, and practice good posture. You may continue to have sex  unless your health care provider directs you otherwise. Relief of Pain or Discomfort Wear a good support bra for breast tenderness.   Take warm sitz baths to soothe any pain or discomfort caused by hemorrhoids. Use hemorrhoid cream if your health care provider approves.   Rest with your legs elevated if you have leg cramps or low back pain. If you develop varicose veins in your legs, wear support hose. Elevate your feet for 15 minutes, 3-4 times a day. Limit salt in your diet. Prenatal Care Schedule your prenatal visits by the  twelfth week of pregnancy. They are usually scheduled monthly at first, then more often in the last 2 months before delivery. Write down your questions. Take them to your prenatal visits. Keep all your prenatal visits as directed by your health care provider. Safety Wear your seat belt at all times when driving. Make a list of emergency phone numbers, including numbers for family, friends, the hospital, and police and fire departments. General Tips Ask your health care provider for a referral to a local prenatal education class. Begin classes no later than at the beginning of month 6 of your pregnancy. Ask for help if you have counseling or nutritional needs during pregnancy. Your health care provider can offer advice or refer you to specialists for help with various needs. Do not use hot tubs, steam rooms, or saunas. Do not douche or use tampons or scented sanitary pads. Do not cross your legs for long periods of time. Avoid cat litter boxes and soil used by cats. These carry germs that can cause birth defects in the baby and possibly loss of the fetus by miscarriage or stillbirth. Avoid all smoking, herbs, alcohol, and medicines not prescribed by your health care provider. Chemicals in these affect the formation and growth of the baby. Schedule a dentist appointment. At home, brush your teeth with a soft toothbrush and be gentle when you floss. SEEK MEDICAL CARE IF:   You have dizziness. You have mild pelvic cramps, pelvic pressure, or nagging pain in the abdominal area. You have persistent nausea, vomiting, or diarrhea. You have a bad smelling vaginal discharge. You have pain with urination. You notice increased swelling in your face, hands, legs, or ankles. SEEK IMMEDIATE MEDICAL CARE IF:  You have a fever. You are leaking fluid from your vagina. You have spotting or bleeding from your vagina. You have severe abdominal cramping or pain. You have rapid weight gain or loss. You vomit blood or material that looks like coffee grounds. You are exposed to [place] measles and have never had them. You are exposed to fifth disease or chickenpox. You develop a severe headache. You have shortness of breath. You have any kind of trauma, such as from a fall or a car accident. Document Released: 11/21/2001 Document Revised: 04/13/2014 Document Reviewed: 10/07/2013 [facility] Patient Information 2015 [place], [place]. This information is not intended to replace advice given to you by your health care provider. Make sure you discuss any questions you have with your health care provider.

## 2022-05-03 LAB — GC/CHLAMYDIA PROBE AMP
Chlamydia trachomatis, NAA: NEGATIVE
Neisseria Gonorrhoeae by PCR: NEGATIVE

## 2022-05-03 LAB — URINE CULTURE

## 2022-05-05 LAB — STATUS REPORT

## 2022-05-12 LAB — CBC/D/PLT+RPR+RH+ABO+RUBIGG...
Antibody Screen: NEGATIVE
Basophils Absolute: 0 10*3/uL (ref 0.0–0.2)
Basos: 0 %
EOS (ABSOLUTE): 0.1 10*3/uL (ref 0.0–0.4)
Eos: 1 %
HCV Ab: NONREACTIVE
HIV Screen 4th Generation wRfx: NONREACTIVE
Hematocrit: 42.5 % (ref 34.0–46.6)
Hemoglobin: 14.3 g/dL (ref 11.1–15.9)
Hepatitis B Surface Ag: NEGATIVE
Immature Grans (Abs): 0 10*3/uL (ref 0.0–0.1)
Immature Granulocytes: 0 %
Lymphocytes Absolute: 2.2 10*3/uL (ref 0.7–3.1)
Lymphs: 21 %
MCH: 29.9 pg (ref 26.6–33.0)
MCHC: 33.6 g/dL (ref 31.5–35.7)
MCV: 89 fL (ref 79–97)
Monocytes Absolute: 0.6 10*3/uL (ref 0.1–0.9)
Monocytes: 5 %
Neutrophils Absolute: 7.6 10*3/uL — ABNORMAL HIGH (ref 1.4–7.0)
Neutrophils: 73 %
Platelets: 394 10*3/uL (ref 150–450)
RBC: 4.79 x10E6/uL (ref 3.77–5.28)
RDW: 12.7 % (ref 11.7–15.4)
RPR Ser Ql: NONREACTIVE
Rh Factor: NEGATIVE
Rubella Antibodies, IGG: 1.6 index (ref >0.99)
WBC: 10.5 10*3/uL (ref 3.4–10.8)

## 2022-05-12 LAB — COMPREHENSIVE METABOLIC PANEL
ALT: 23 IU/L (ref 0–32)
AST: 20 IU/L (ref 0–40)
Albumin/Globulin Ratio: 1.7 (ref 1.2–2.2)
Albumin: 4.3 g/dL (ref 3.9–5.0)
Alkaline Phosphatase: 75 IU/L (ref 44–121)
BUN/Creatinine Ratio: 9 (ref 9–23)
BUN: 5 mg/dL — ABNORMAL LOW (ref 6–20)
Bilirubin Total: 0.2 mg/dL (ref 0.0–1.2)
CO2: 20 mmol/L (ref 20–29)
Calcium: 9.1 mg/dL (ref 8.7–10.2)
Chloride: 103 mmol/L (ref 96–106)
Creatinine, Ser: 0.53 mg/dL — ABNORMAL LOW (ref 0.57–1.00)
Globulin, Total: 2.6 g/dL (ref 1.5–4.5)
Glucose: 81 mg/dL (ref 70–99)
Potassium: 4.1 mmol/L (ref 3.5–5.2)
Sodium: 137 mmol/L (ref 134–144)
Total Protein: 6.9 g/dL (ref 6.0–8.5)
eGFR: 128 mL/min/{1.73_m2} (ref >59)

## 2022-05-12 LAB — INTEGRATED 1
Crown Rump Length: 68.9 mm
Gest. Age on Collection Date: 13 weeks
Maternal Age at EDD: 31.1 yr
Nuchal Translucency (NT): 1.4 mm
Number of Fetuses: 1
PAPP-A Value: 686 ng/mL
Weight: 286 [lb_av]

## 2022-05-12 LAB — HEMOGLOBIN A1C
Est. average glucose Bld gHb Est-mCnc: 103 mg/dL
Hgb A1c MFr Bld: 5.2 % (ref 4.8–5.6)

## 2022-05-12 LAB — PROTEIN / CREATININE RATIO, URINE
Creatinine, Urine: 48.5 mg/dL
Protein, Ur: 8.3 mg/dL
Protein/Creat Ratio: 171 mg/g creat (ref 0–200)

## 2022-05-12 LAB — HCV INTERPRETATION

## 2022-05-19 ENCOUNTER — Other Ambulatory Visit: Payer: Self-pay | Admitting: Obstetrics & Gynecology

## 2022-05-19 ENCOUNTER — Telehealth: Payer: Self-pay | Admitting: *Deleted

## 2022-05-19 DIAGNOSIS — O219 Vomiting of pregnancy, unspecified: Secondary | ICD-10-CM

## 2022-05-19 MED ORDER — ONDANSETRON HCL 4 MG PO TABS: 4.0000 mg | ORAL_TABLET | Freq: Three times a day (TID) | ORAL | 0 refills | Status: DC | PRN

## 2022-05-19 NOTE — Telephone Encounter (Signed)
Patient states several in her family have had a stomach virus and now she has it.  She is vomiting and not able to keep much of anything down including water and is wanting something for nausea.  Informed most viruses just need to run their course but will get provider to send in nausea medication to hopefully help her keep liquids down.  Advised if she began not urinating much at all or started to have cramping, to go to [facility] for possible fluids.  Pt verbalized understanding with no further questions.

## 2022-05-19 NOTE — Progress Notes (Signed)
Rx for zofran

## 2022-05-22 ENCOUNTER — Encounter: Payer: Self-pay | Admitting: Women's Health

## 2022-05-22 ENCOUNTER — Ambulatory Visit (INDEPENDENT_AMBULATORY_CARE_PROVIDER_SITE_OTHER): Payer: Medicaid Other | Admitting: Women's Health

## 2022-05-22 VITALS — BP 130/89 | HR 90 | Wt 276.6 lb

## 2022-05-22 DIAGNOSIS — Z1379 Encounter for other screening for genetic and chromosomal anomalies: Secondary | ICD-10-CM | POA: Diagnosis not present

## 2022-05-22 DIAGNOSIS — Z363 Encounter for antenatal screening for malformations: Secondary | ICD-10-CM

## 2022-05-22 DIAGNOSIS — O0992 Supervision of high risk pregnancy, unspecified, second trimester: Secondary | ICD-10-CM

## 2022-05-22 DIAGNOSIS — O10919 Unspecified pre-existing hypertension complicating pregnancy, unspecified trimester: Secondary | ICD-10-CM

## 2022-05-22 NOTE — Patient Instructions (Addendum)
Barbara Robertson, thank you for choosing our office today! We appreciate the opportunity to meet your healthcare needs. You may receive a short survey by mail, e-mail, or through [employer]. If you are happy with your care we would appreciate if you could take just a few minutes to complete the survey questions. We read all of your comments and take your feedback very seriously. Thank you again for choosing our office.  Center for [employer] Team at [facility] at [facility] ([address] [place], [place] [address]) Entrance C, located off of [address] Free 24/7 valet parking  Go to [email or web address] to register for FREE online childbirth classes  Call the office [phone]) or go to [facility] if: You begin to severe cramping Your water breaks.  Sometimes it is a big gush of fluid, sometimes it is just a trickle that keeps getting your panties wet or running down your legs You have vaginal bleeding.  It is normal to have a small amount of spotting if your cervix was checked.   [facility] Pediatricians/Family Doctors [facility]): [address] Colette Ribas, [phone]           [facility]: [address], [phone]                [facility] Medicine [facility]): [address], [phone] (call to ask if accepting patients) [facility] Department: [address], [place], [phone]    [facility] Pediatricians/Family Doctors [facility]): [phone] [address], [phone] [facility]: [address] [place], [phone] [facility]: [address], [phone]  [facility] Doctors  [facility]): [phone] [facility]: [address], [phone]   [facility] Doctors [facility]: [address] [phone]  [facility] Doctors  [facility]: [phone], [phone]  Home Blood Pressure Monitoring for Patients   Your provider has recommended that you check your blood pressure (BP) at least once a week at home. If you do not have a blood pressure cuff at home, one will be provided for you. Contact your provider if you have not received your monitor within 1 week.   Helpful Tips for Accurate Home Blood Pressure Checks  Don't smoke, exercise, or drink caffeine 30 minutes before checking your BP Use the restroom before checking your BP (a full bladder can raise your pressure) Relax in a comfortable upright chair Feet on the ground Left arm resting comfortably on a flat surface at the level of your heart Legs uncrossed Back supported Sit quietly and don't talk Place the cuff on your bare arm Adjust snuggly, so that only two fingertips can fit between your skin and the top of the cuff Check 2 readings separated by at least one minute Keep a log of your BP readings For a visual, please reference this diagram: http://ccnc.care/bpdiagram  Provider Name: [facility]     Phone: [phone]  Zone 1: ALL CLEAR  Continue to monitor your symptoms:  BP reading is less than 140 (top number) or less than 90 (bottom number)  No right upper stomach pain No headaches or seeing spots No feeling nauseated or throwing up No swelling in face and hands  Zone 2: CAUTION Call your doctor's office for any of the following:  BP reading is greater than 140 (top number) or greater than  90 (bottom number)  Stomach pain under your ribs in the middle or right side Headaches or seeing spots Feeling nauseated or throwing up Swelling in face and hands  Zone 3: EMERGENCY  Seek immediate medical care if you have any of the following:  BP reading is greater than160 (top number) or greater than 110 (bottom number) Severe headaches not improving with Tylenol Serious difficulty catching your breath Any worsening symptoms from  Zone 2  For Headaches:  Stay well hydrated, drink enough water so that your urine is clear, sometimes if you are dehydrated you can get headaches Eat small frequent meals and snacks, sometimes if you are hungry you can get headaches Sometimes you get headaches during pregnancy from the pregnancy hormones You can try tylenol (1-2 regular strength  or 1-2 extra strength ) as directed on the box. The least amount of medication that works is best.  Cool compresses (cool wet washcloth or ice pack) to area of head that is hurting You can also try drinking a caffeinated drink to see if this will help If not helping, try below:  For Prevention of Headaches/Migraines: CoQ10  three times daily Vitamin B2  daily Magnesium Oxide 400-600mg  daily  Foods to alleviate migraines:  1) dark leafy greens 2) avocado 3) tuna 4) salmon  5) beans and legumes  Foods to avoid: 1) Excessive (or irregular timing) coffee 3) aged cheeses 4) chocolate 5) citrus fruits 6) aspartame and other artifical sweeteners 7) yeast 8) MSG (in processed foods) 9) processed and cured meats 10) nuts and certain seeds 11) chicken livers and other organ meats 12) dairy products like buttermilk, sour cream, and yogurt 13) dried fruits like dates, figs, and raisins 14) garlic 15) onions 16) potato chips 17) pickled foods like olives and sauerkraut 18) some fresh fruits like ripe banana, papaya, red plums, raspberries, kiwi, pineapple 19) tomato-based products  Recommend to keep a migraine diary: rate daily the severity of your headache (1-10) and what foods you eat that day to help determine patterns.   If You Get a Bad Headache/Migraine: Benadryl   Magnesium Oxide 1 large Gatorade 2 extra strength Tylenol (1,000mg  total) 1 cup coffee or Coke      If this doesn't help please call us @ [phone]     Second Trimester of Pregnancy The second trimester is from week 14 through week 27  (months 4 through 6). The second trimester is often a time when you feel your best. Your body has adjusted to being pregnant, and you begin to feel better physically. Usually, morning sickness has lessened or quit completely, you may have more energy, and you may have an increase in appetite. The second trimester is also a time when the fetus is growing rapidly. At the end of the sixth month, the fetus is about 9 inches long and weighs about 1 pounds. You will likely begin to feel the baby move (quickening) between 16 and 20 weeks of pregnancy. Body changes during your second trimester Your body continues to go through many changes during your second trimester. The changes vary from woman to woman. Your weight will continue to increase. You will notice your lower abdomen bulging out. You may begin to get stretch marks on your hips, abdomen, and breasts. You may develop headaches that can be relieved by medicines. The medicines should be approved by your health care provider. You may urinate more often because the fetus is pressing on your bladder. You may develop or continue to  have heartburn as a result of your pregnancy. You may develop constipation because certain hormones are causing the muscles that push waste through your intestines to slow down. You may develop hemorrhoids or swollen, bulging veins (varicose veins). You may have back pain. This is caused by: Weight gain. Pregnancy hormones that are relaxing the joints in your pelvis. A shift in weight and the muscles that support your balance. Your breasts will continue to grow and they will continue to become tender. Your gums may bleed and may be sensitive to brushing and flossing. Dark spots or blotches (chloasma, mask of pregnancy) may develop on your face. This will likely fade after the baby is born. A dark line from your belly button to the pubic area (linea nigra) may appear. This will likely fade after the baby is born. You may  have changes in your hair. These can include thickening of your hair, rapid growth, and changes in texture. Some women also have hair loss during or after pregnancy, or hair that feels dry or thin. Your hair will most likely return to normal after your baby is born.  What to expect at prenatal visits During a routine prenatal visit: You will be weighed to make sure you and the fetus are growing normally. Your blood pressure will be taken. Your abdomen will be measured to track your baby's growth. The fetal heartbeat will be listened to. Any test results from the previous visit will be discussed.  Your health care provider may ask you: How you are feeling. If you are feeling the baby move. If you have had any abnormal symptoms, such as leaking fluid, bleeding, severe headaches, or abdominal cramping. If you are using any tobacco products, including cigarettes, chewing tobacco, and electronic cigarettes. If you have any questions.  Other tests that may be performed during your second trimester include: Blood tests that check for: Low iron levels (anemia). High blood sugar that affects pregnant women (gestational diabetes) between 37 and 28 weeks. Rh antibodies. This is to check for a protein on red blood cells (Rh factor). Urine tests to check for infections, diabetes, or protein in the urine. An ultrasound to confirm the proper growth and development of the baby. An amniocentesis to check for possible genetic problems. Fetal screens for spina bifida and Down syndrome. HIV (human immunodeficiency virus) testing. Routine prenatal testing includes screening for HIV, unless you choose not to have this test.  Follow these instructions at home: Medicines Follow your health care provider's instructions regarding medicine use. Specific medicines may be either safe or unsafe to take during pregnancy. Take a prenatal vitamin that contains at least 600 micrograms (mcg) of folic acid. If you  develop constipation, try taking a stool softener if your health care provider approves. Eating and drinking Eat a balanced diet that includes fresh fruits and vegetables, whole grains, good sources of protein such as meat, eggs, or tofu, and low-fat dairy. Your health care provider will help you determine the amount of weight gain that is right for you. Avoid raw meat and uncooked cheese. These carry germs that can cause birth defects in the baby. If you have low calcium intake from food, talk to your health care provider about whether you should take a daily calcium supplement. Limit foods that are high in fat and processed sugars, such as fried and sweet foods. To prevent constipation: Drink enough fluid to keep your urine clear or pale yellow. Eat foods that are high in fiber, such as fresh  fruits and vegetables, whole grains, and beans. Activity Exercise only as directed by your health care provider. Most women can continue their usual exercise routine during pregnancy. Try to exercise for 30 minutes at least 5 days a week. Stop exercising if you experience uterine contractions. Avoid heavy lifting, wear low heel shoes, and practice good posture. A sexual relationship may be continued unless your health care provider directs you otherwise. Relieving pain and discomfort Wear a good support bra to prevent discomfort from breast tenderness. Take warm sitz baths to soothe any pain or discomfort caused by hemorrhoids. Use hemorrhoid cream if your health care provider approves. Rest with your legs elevated if you have leg cramps or low back pain. If you develop varicose veins, wear support hose. Elevate your feet for 15 minutes, 3-4 times a day. Limit salt in your diet. Prenatal Care Write down your questions. Take them to your prenatal visits. Keep all your prenatal visits as told by your health care provider. This is important. Safety Wear your seat belt at all times when driving. Make a list  of emergency phone numbers, including numbers for family, friends, the hospital, and police and fire departments. General instructions Ask your health care provider for a referral to a local prenatal education class. Begin classes no later than the beginning of month 6 of your pregnancy. Ask for help if you have counseling or nutritional needs during pregnancy. Your health care provider can offer advice or refer you to specialists for help with various needs. Do not use hot tubs, steam rooms, or saunas. Do not douche or use tampons or scented sanitary pads. Do not cross your legs for long periods of time. Avoid cat litter boxes and soil used by cats. These carry germs that can cause birth defects in the baby and possibly loss of the fetus by miscarriage or stillbirth. Avoid all smoking, herbs, alcohol, and unprescribed drugs. Chemicals in these products can affect the formation and growth of the baby. Do not use any products that contain nicotine or tobacco, such as cigarettes and e-cigarettes. If you need help quitting, ask your health care provider. Visit your dentist if you have not gone yet during your pregnancy. Use a soft toothbrush to brush your teeth and be gentle when you floss. Contact a health care provider if: You have dizziness. You have mild pelvic cramps, pelvic pressure, or nagging pain in the abdominal area. You have persistent nausea, vomiting, or diarrhea. You have a bad smelling vaginal discharge. You have pain when you urinate. Get help right away if: You have a fever. You are leaking fluid from your vagina. You have spotting or bleeding from your vagina. You have severe abdominal cramping or pain. You have rapid weight gain or weight loss. You have shortness of breath with chest pain. You notice sudden or extreme swelling of your face, hands, ankles, feet, or legs. You have not felt your baby move in over an hour. You have severe headaches that do not go away when you  take medicine. You have vision changes. Summary The second trimester is from week 14 through week 27 (months 4 through 6). It is also a time when the fetus is growing rapidly. Your body goes through many changes during pregnancy. The changes vary from woman to woman. Avoid all smoking, herbs, alcohol, and unprescribed drugs. These chemicals affect the formation and growth your baby. Do not use any tobacco products, such as cigarettes, chewing tobacco, and e-cigarettes. If you need help quitting,  ask your health care provider. Contact your health care provider if you have any questions. Keep all prenatal visits as told by your health care provider. This is important. This information is not intended to replace advice given to you by your health care provider. Make sure you discuss any questions you have with your health care provider. Document Released: 11/21/2001 Document Revised: 05/04/2016 Document Reviewed: 01/28/2013 Elsevier Interactive Patient Education  2017 [employer].

## 2022-05-22 NOTE — Progress Notes (Signed)
HIGH-RISK PREGNANCY VISIT Patient name: Barbara Robertson MRN [id number]  Date of birth: 1991-09-09 Chief Complaint:   Routine Prenatal Visit (2nd IT)  History of Present Illness:   Barbara Robertson is a 31 y.o. [phone] female at [redacted]w[redacted]d with an Estimated Date of Delivery: 11/04/22 being seen today for ongoing management of a high-risk pregnancy complicated by chronic hypertension currently on labetalol 100mg  BID.    Today she reports  headaches, pain in bilateral mid back when standing up from squatting, dog jumped on her belly yesterday . Contractions: Not present.  .  Movement: Absent. denies leaking of fluid.      05/01/2022    9:48 AM 01/24/2022    9:44 AM 07/15/2021    9:01 AM 04/07/2021    2:57 PM 11/25/2019    8:40 AM  Depression screen PHQ 2/9  Decreased Interest 1 0 0 0 0  Down, Depressed, Hopeless 0 0 0 0 0  PHQ - 2 Score 1 0 0 0 0  Altered sleeping 1 1   3   Tired, decreased energy 1 1   1   Change in appetite 1 0   0  Feeling bad or failure about yourself  0 0   0  Trouble concentrating 0 1   0  Moving slowly or fidgety/restless 0 0   0  Suicidal thoughts 0 0   0  PHQ-9 Score 4 3   4   Difficult doing work/chores     Not difficult at all        05/01/2022    9:49 AM 01/24/2022    9:44 AM 11/25/2019    8:38 AM 01/23/2018    5:43 PM  GAD 7 : Generalized Anxiety Score  Nervous, Anxious, on Edge 0 1 2 0  Control/stop worrying 1 1 3  0  Worry too much - different things 1 1 3  0  Trouble relaxing 1 1 3  0  Restless 0 1 3 0  Easily annoyed or irritable 0 1 1 0  Afraid - awful might happen 0 1 0 0  Total GAD 7 Score 3 7 15  0  Anxiety Difficulty   Not difficult at all      Review of Systems:   Pertinent items are noted in HPI Denies abnormal vaginal discharge w/ itching/odor/irritation, headaches, visual changes, shortness of breath, chest pain, abdominal pain, severe nausea/vomiting, or problems with urination or bowel movements unless otherwise stated above. Pertinent History  Reviewed:  Reviewed past medical,surgical, social, obstetrical and family history.  Reviewed problem list, medications and allergies. Physical Assessment:   Vitals:   05/22/22 0945  BP: 130/89  Pulse: 90  Weight: 276 lb 9.6 oz (125.5 kg)  Body mass index is 38.58 kg/m.           Physical Examination:   General appearance: alert, well appearing, and in no distress  Mental status: alert, oriented to person, place, and time  Skin: warm & dry   Extremities: Edema: None    Cardiovascular: normal heart rate noted  Respiratory: normal respiratory effort, no distress  Abdomen: gravid, soft, non-tender  Pelvic: Cervical exam deferred         Fetal Status: Fetal Heart Rate (bpm): 153   Movement: Absent    Fetal Surveillance Testing today: doppler   Chaperone: N/A    No results found for this or any previous visit (from the past 24 hour(s)).  Assessment & Plan:  High-risk pregnancy: [id number] at [redacted]w[redacted]d with an Estimated Date of Delivery:  11/04/22   1) CHTN, Labetalol 100mg  BID, ASA  2) H/O pre-e  3) Headaches> gave printed prevention/relief measures   Meds: No orders of the defined types were placed in this encounter.   Labs/procedures today: 2nd IT  Treatment Plan: Growth u/s q 4wks    2x/wk testing nst/sono @ 32wks     Deliver 38-39wks (37wks or prn if poor control)____   Reviewed: Preterm labor symptoms and general obstetric precautions including but not limited to vaginal bleeding, contractions, leaking of fluid and fetal movement were reviewed in detail with the patient.  All questions were answered. Does have home bp cuff. Office bp cuff given: not applicable. Check bp weekly, let us know if consistently >140 and/or >90.  Follow-up: Return in about 3 weeks (around 06/12/2022) for HROB, [id number], MD or CNM, in person.   No future appointments.  Orders Placed This Encounter  Procedures   INTEGRATED 2   Roma Schanz CNM, [facility] 05/22/2022 10:16 AM

## 2022-05-24 LAB — INTEGRATED 2
AFP MoM: 1.3
Alpha-Fetoprotein: 23.6 ng/mL
Crown Rump Length: 68.9 mm
DIA MoM: 2.97
DIA Value: 324.9 pg/mL
Estriol, Unconjugated: 0.99 ng/mL
Gest. Age on Collection Date: 13 weeks
Gestational Age: 16 weeks
Maternal Age at EDD: 31.1 yr
Nuchal Translucency (NT): 1.4 mm
Nuchal Translucency MoM: 0.9
Number of Fetuses: 1
PAPP-A MoM: 1.28
PAPP-A Value: 686 ng/mL
Test Results:: NEGATIVE
Weight: 286 [lb_av]
Weight: 286 [lb_av]
hCG MoM: 2.34
hCG Value: 51.7 IU/mL
uE3 MoM: 1.34

## 2022-05-30 DIAGNOSIS — O0991 Supervision of high risk pregnancy, unspecified, first trimester: Secondary | ICD-10-CM | POA: Diagnosis not present

## 2022-06-22 ENCOUNTER — Ambulatory Visit (INDEPENDENT_AMBULATORY_CARE_PROVIDER_SITE_OTHER): Payer: Medicaid Other | Admitting: Obstetrics & Gynecology

## 2022-06-22 ENCOUNTER — Ambulatory Visit (INDEPENDENT_AMBULATORY_CARE_PROVIDER_SITE_OTHER): Payer: Medicaid Other

## 2022-06-22 ENCOUNTER — Encounter: Payer: Self-pay | Admitting: Obstetrics & Gynecology

## 2022-06-22 VITALS — BP 121/83 | HR 89 | Wt 285.0 lb

## 2022-06-22 DIAGNOSIS — Z6791 Unspecified blood type, Rh negative: Secondary | ICD-10-CM

## 2022-06-22 DIAGNOSIS — O0992 Supervision of high risk pregnancy, unspecified, second trimester: Secondary | ICD-10-CM

## 2022-06-22 DIAGNOSIS — Z3A20 20 weeks gestation of pregnancy: Secondary | ICD-10-CM

## 2022-06-22 DIAGNOSIS — O10919 Unspecified pre-existing hypertension complicating pregnancy, unspecified trimester: Secondary | ICD-10-CM

## 2022-06-22 DIAGNOSIS — Z363 Encounter for antenatal screening for malformations: Secondary | ICD-10-CM

## 2022-06-22 DIAGNOSIS — O0991 Supervision of high risk pregnancy, unspecified, first trimester: Secondary | ICD-10-CM

## 2022-06-22 NOTE — Progress Notes (Signed)
HIGH-RISK PREGNANCY VISIT Patient name: Barbara Robertson MRN [id number]  Date of birth: Jul 04, 1991 Chief Complaint:   Routine Prenatal Visit  History of Present Illness:   Barbara Robertson is a 31 y.o. [id number] female at [redacted]w[redacted]d with an Estimated Date of Delivery: 11/04/22 being seen today for ongoing management of a high-risk pregnancy complicated by chronic hypertension currently on labetalol 100 BID.    Today she reports no complaints. Contractions: Not present. Vag. Bleeding: None.  Movement: Present. denies leaking of fluid.      05/01/2022    9:48 AM 01/24/2022    9:44 AM 07/15/2021    9:01 AM 04/07/2021    2:57 PM 11/25/2019    8:40 AM  Depression screen PHQ 2/9  Decreased Interest 1 0 0 0 0  Down, Depressed, Hopeless 0 0 0 0 0  PHQ - 2 Score 1 0 0 0 0  Altered sleeping 1 1   3   Tired, decreased energy 1 1   1   Change in appetite 1 0   0  Feeling bad or failure about yourself  0 0   0  Trouble concentrating 0 1   0  Moving slowly or fidgety/restless 0 0   0  Suicidal thoughts 0 0   0  PHQ-9 Score 4 3   4   Difficult doing work/chores     Not difficult at all        05/01/2022    9:49 AM 01/24/2022    9:44 AM 11/25/2019    8:38 AM 01/23/2018    5:43 PM  GAD 7 : Generalized Anxiety Score  Nervous, Anxious, on Edge 0 1 2 0  Control/stop worrying 1 1 3  0  Worry too much - different things 1 1 3  0  Trouble relaxing 1 1 3  0  Restless 0 1 3 0  Easily annoyed or irritable 0 1 1 0  Afraid - awful might happen 0 1 0 0  Total GAD 7 Score 3 7 15  0  Anxiety Difficulty   Not difficult at all      Review of Systems:   Pertinent items are noted in HPI Denies abnormal vaginal discharge w/ itching/odor/irritation, headaches, visual changes, shortness of breath, chest pain, abdominal pain, severe nausea/vomiting, or problems with urination or bowel movements unless otherwise stated above. Pertinent History Reviewed:  Reviewed past medical,surgical, social, obstetrical and family  history.  Reviewed problem list, medications and allergies. Physical Assessment:   Vitals:   06/22/22 1214 06/22/22 1219  BP: 131/90 121/83  Pulse: 82 89  Weight: 285 lb (129.3 kg)   Body mass index is 39.75 kg/m.           Physical Examination:   General appearance: alert, well appearing, and in no distress  Mental status: alert, oriented to person, place, and time  Skin: warm & dry   Extremities: Edema: None    Cardiovascular: normal heart rate noted  Respiratory: normal respiratory effort, no distress  Abdomen: gravid, soft, non-tender  Pelvic: Cervical exam deferred         Fetal Status:     Movement: Present    Fetal Surveillance Testing today: sonogram normal   Chaperone: N/A    No results found for this or any previous visit (from the past 24 hour(s)).  Assessment & Plan:  High-risk pregnancy: 01/25/2018 at [redacted]w[redacted]d with an Estimated Date of Delivery: 11/04/22      ICD-10-CM   1. Supervision of high risk pregnancy in  first trimester  O09.91     2. Chronic hypertension affecting pregnancy  O10.919    labetalol 100 BID         Meds: No orders of the defined types were placed in this encounter.   Orders: No orders of the defined types were placed in this encounter.    Labs/procedures today: U/S  Treatment Plan:  per protocol  Reviewed: Preterm labor symptoms and general obstetric precautions including but not limited to vaginal bleeding, contractions, leaking of fluid and fetal movement were reviewed in detail with the patient.  All questions were answered. Does not have home bp cuff. Office bp cuff given: not applicable. Check bp daily, let us know if consistently >150 and/or >95.  Follow-up: Return in about 4 weeks (around 07/20/2022) for HROB.   No future appointments.  No orders of the defined types were placed in this encounter.  Lazaro Arms  Attending Physician for the Center for [facility] 06/22/2022 12:40 PM

## 2022-06-22 NOTE — Progress Notes (Signed)
[place] 20+5 wks,cephalic,posterior placenta gr 0,normal ovaries,cx 3.6 cm,SVP of fluid 4.6 cm,FHR 157 bpm,EFW 377 g 48%,anatomy complete,no obvious abnormalities

## 2022-06-29 ENCOUNTER — Other Ambulatory Visit: Payer: Self-pay | Admitting: Adult Health

## 2022-06-29 DIAGNOSIS — Z3041 Encounter for surveillance of contraceptive pills: Secondary | ICD-10-CM

## 2022-07-04 ENCOUNTER — Other Ambulatory Visit: Payer: Self-pay | Admitting: Adult Health

## 2022-07-20 ENCOUNTER — Encounter: Payer: Self-pay | Admitting: Obstetrics & Gynecology

## 2022-07-20 ENCOUNTER — Ambulatory Visit (INDEPENDENT_AMBULATORY_CARE_PROVIDER_SITE_OTHER): Payer: Medicaid Other | Admitting: Obstetrics & Gynecology

## 2022-07-20 ENCOUNTER — Encounter: Payer: Medicaid Other | Admitting: Obstetrics & Gynecology

## 2022-07-20 VITALS — BP 129/80 | HR 97 | Wt 285.2 lb

## 2022-07-20 DIAGNOSIS — O0992 Supervision of high risk pregnancy, unspecified, second trimester: Secondary | ICD-10-CM

## 2022-07-20 DIAGNOSIS — Z3A24 24 weeks gestation of pregnancy: Secondary | ICD-10-CM

## 2022-07-20 DIAGNOSIS — O10919 Unspecified pre-existing hypertension complicating pregnancy, unspecified trimester: Secondary | ICD-10-CM

## 2022-07-20 LAB — POCT URINALYSIS DIPSTICK OB
Glucose, UA: NEGATIVE
Nitrite, UA: NEGATIVE
POC,PROTEIN,UA: NEGATIVE

## 2022-07-20 NOTE — Progress Notes (Signed)
HIGH-RISK PREGNANCY VISIT Patient name: Barbara Robertson MRN [id number]  Date of birth: Dec 17, 1990 Chief Complaint:   High Risk Gestation  History of Present Illness:   Barbara Robertson is a 31 y.o. [id number] female at [redacted]w[redacted]d with an Estimated Date of Delivery: 11/04/22 being seen today for ongoing management of a high-risk pregnancy complicated by: -cHTN- on Labetalol 100mg  bid -A neg.    Today she reports no complaints.   Contractions: Not present. Vag. Bleeding: None.  Movement: Present. denies leaking of fluid.      05/01/2022    9:48 AM 01/24/2022    9:44 AM 07/15/2021    9:01 AM 04/07/2021    2:57 PM 11/25/2019    8:40 AM  Depression screen PHQ 2/9  Decreased Interest 1 0 0 0 0  Down, Depressed, Hopeless 0 0 0 0 0  PHQ - 2 Score 1 0 0 0 0  Altered sleeping 1 1   3   Tired, decreased energy 1 1   1   Change in appetite 1 0   0  Feeling bad or failure about yourself  0 0   0  Trouble concentrating 0 1   0  Moving slowly or fidgety/restless 0 0   0  Suicidal thoughts 0 0   0  PHQ-9 Score 4 3   4   Difficult doing work/chores     Not difficult at all     Current Outpatient Medications  Medication Instructions   aspirin EC 162 mg, Oral, Daily, Swallow whole.   Blood Pressure Monitor MISC For regular home bp monitoring during pregnancy   labetalol (NORMODYNE) 100 mg, Oral, 2 times daily   omeprazole (PRILOSEC) 20 mg, Oral, Daily, 1 tablet a day   ondansetron (ZOFRAN) 4 mg, Oral, Every 8 hours PRN   Prenatal Vit-Fe Fumarate-FA (PRENATAL VITAMIN PO) 1 tablet, Oral, Daily   Probiotic CHEW 1 tablet, Oral, Daily     Review of Systems:   Pertinent items are noted in HPI Denies abnormal vaginal discharge w/ itching/odor/irritation, headaches, visual changes, shortness of breath, chest pain, abdominal pain, severe nausea/vomiting, or problems with urination or bowel movements unless otherwise stated above. Pertinent History Reviewed:  Reviewed past medical,surgical, social, obstetrical  and family history.  Reviewed problem list, medications and allergies. Physical Assessment:   Vitals:   07/20/22 0902 07/20/22 0906  BP: (!) 141/92 129/80  Pulse: 96 97  Weight: 285 lb 3.2 oz (129.4 kg)   Body mass index is 39.78 kg/m.           Physical Examination:   General appearance: alert, well appearing, and in no distress  Mental status: normal mood, behavior, speech, dress, motor activity, and thought processes  Skin: warm & dry   Extremities: Edema: Trace    Cardiovascular: normal heart rate noted  Respiratory: normal respiratory effort, no distress  Abdomen: gravid, soft, non-tender  Pelvic: Cervical exam deferred         Fetal Status: Fetal Heart Rate (bpm): 150 Fundal Height: 24 cm Movement: Present    Fetal Surveillance Testing today: doppler   Chaperone: N/A    Results for orders placed or performed in visit on 07/20/22 (from the past 24 hour(s))  POC Urinalysis Dipstick OB   Collection Time: 07/20/22  9:05 AM  Result Value Ref Range   Color, UA     Clarity, UA     Glucose, UA Negative Negative   Bilirubin, UA     Ketones, UA trace  Spec Grav, UA     Blood, UA trace    pH, UA     POC,PROTEIN,UA Negative Negative, Trace, Small (1+), Moderate (2+), Large (3+), 4+   Urobilinogen, UA     Nitrite, UA neg    Leukocytes, UA Trace (A) Negative   Appearance     Odor       Assessment & Plan:  High-risk pregnancy: [id number] at [redacted]w[redacted]d with an Estimated Date of Delivery: 11/04/22   1) Chronic HTN -doing well with current meds -reviewed her concerns regarding timing of IOL.  Yes IOL pending BP management 37-39wks -antepartum testing to start @ 32wks -continue growth q 4wks  Meds: No orders of the defined types were placed in this encounter.   Labs/procedures today: none  Treatment Plan:  PN2 next visit with growth scan  Reviewed: Preterm labor symptoms and general obstetric precautions including but not limited to vaginal bleeding, contractions, leaking  of fluid and fetal movement were reviewed in detail with the patient.  All questions were answered. Pt has home bp cuff. Check bp weekly, let us know if >140/90.   Follow-up: Return in about 4 weeks (around 08/17/2022) for HROB visit, PN-2 and growth, continue growth every 4wks.   Future Appointments  Date Time Provider Department Center  08/18/2022  8:30 AM [facility]  08/18/2022 10:45 AM [facility] [place] [facility] None  08/18/2022 11:30 AM Lazaro Arms, MD [facility]  09/15/2022  9:15 AM [facility] [place] [facility] None  09/15/2022 10:10 AM Lazaro Arms, MD [facility]  10/13/2022  9:15 AM [facility] [place] [facility] None  10/13/2022 10:10 AM Myna Hidalgo, DO [facility]    Orders Placed This Encounter  Procedures   POC Urinalysis Dipstick OB    Myna Hidalgo, DO Attending Obstetrician & Gynecologist, [facility] for [employer], [facility]

## 2022-07-27 ENCOUNTER — Other Ambulatory Visit: Payer: Self-pay | Admitting: Adult Health

## 2022-07-27 ENCOUNTER — Other Ambulatory Visit: Payer: Self-pay | Admitting: Obstetrics & Gynecology

## 2022-08-17 ENCOUNTER — Other Ambulatory Visit: Payer: Self-pay | Admitting: Obstetrics & Gynecology

## 2022-08-17 DIAGNOSIS — O10919 Unspecified pre-existing hypertension complicating pregnancy, unspecified trimester: Secondary | ICD-10-CM

## 2022-08-18 ENCOUNTER — Encounter: Payer: Self-pay | Admitting: Obstetrics & Gynecology

## 2022-08-18 ENCOUNTER — Ambulatory Visit (INDEPENDENT_AMBULATORY_CARE_PROVIDER_SITE_OTHER): Payer: Medicaid Other | Admitting: Obstetrics & Gynecology

## 2022-08-18 ENCOUNTER — Other Ambulatory Visit: Payer: Medicaid Other

## 2022-08-18 ENCOUNTER — Ambulatory Visit (INDEPENDENT_AMBULATORY_CARE_PROVIDER_SITE_OTHER): Payer: Medicaid Other

## 2022-08-18 VITALS — BP 139/90 | HR 107 | Wt 292.0 lb

## 2022-08-18 DIAGNOSIS — O0992 Supervision of high risk pregnancy, unspecified, second trimester: Secondary | ICD-10-CM

## 2022-08-18 DIAGNOSIS — Z3A28 28 weeks gestation of pregnancy: Secondary | ICD-10-CM

## 2022-08-18 DIAGNOSIS — O26899 Other specified pregnancy related conditions, unspecified trimester: Secondary | ICD-10-CM

## 2022-08-18 DIAGNOSIS — O0993 Supervision of high risk pregnancy, unspecified, third trimester: Secondary | ICD-10-CM

## 2022-08-18 DIAGNOSIS — O10919 Unspecified pre-existing hypertension complicating pregnancy, unspecified trimester: Secondary | ICD-10-CM

## 2022-08-18 DIAGNOSIS — Z131 Encounter for screening for diabetes mellitus: Secondary | ICD-10-CM | POA: Diagnosis not present

## 2022-08-18 LAB — POCT URINALYSIS DIPSTICK OB
Blood, UA: NEGATIVE
Glucose, UA: NEGATIVE
Ketones, UA: NEGATIVE
Leukocytes, UA: NEGATIVE
Nitrite, UA: NEGATIVE

## 2022-08-18 NOTE — Progress Notes (Signed)
[place] 28+6 wks,cephalic,posterior placenta gr 1,cx 3.5 cm,AFI 16.2 cm,dilated lateral ventricles left 1.3 cm,right 1.5 cm,FHR 164 bpm,EFW 1485 g 77%

## 2022-08-18 NOTE — Progress Notes (Signed)
HIGH-RISK PREGNANCY VISIT Patient name: Barbara Robertson MRN [id number]  Date of birth: [birth year] Chief Complaint:   Routine Prenatal Visit  History of Present Illness:   Barbara Robertson is a 31 y.o. [id number] female at [redacted]w[redacted]d with an Estimated Date of Delivery: 11/04/22 being seen today for ongoing management of a high-risk pregnancy complicated by chronic hypertension currently on labetalol 100 BID.    Today she reports no complaints. Contractions: Not present. Vag. Bleeding: None.  Movement: Present. denies leaking of fluid.      08/18/2022   11:30 AM 05/01/2022    9:48 AM 01/24/2022    9:44 AM 07/15/2021    9:01 AM 04/07/2021    2:57 PM  Depression screen PHQ 2/9  Decreased Interest 1 1 0 0 0  Down, Depressed, Hopeless  0 0 0 0  PHQ - 2 Score 1 1 0 0 0  Altered sleeping 2 1 1     Tired, decreased energy 1 1 1     Change in appetite 1 1 0    Feeling bad or failure about yourself  0 0 0    Trouble concentrating 0 0 1    Moving slowly or fidgety/restless 0 0 0    Suicidal thoughts 0 0 0    PHQ-9 Score 5 4 3           08/18/2022   11:30 AM 05/01/2022    9:49 AM 01/24/2022    9:44 AM 11/25/2019    8:38 AM  GAD 7 : Generalized Anxiety Score  Nervous, Anxious, on Edge 1 0 1 2  Control/stop worrying 1 1 1 3   Worry too much - different things 1 1 1 3   Trouble relaxing 1 1 1 3   Restless 0 0 1 3  Easily annoyed or irritable 1 0 1 1  Afraid - awful might happen 0 0 1 0  Total GAD 7 Score 5 3 7 15   Anxiety Difficulty    Not difficult at all     Review of Systems:   Pertinent items are noted in HPI Denies abnormal vaginal discharge w/ itching/odor/irritation, headaches, visual changes, shortness of breath, chest pain, abdominal pain, severe nausea/vomiting, or problems with urination or bowel movements unless otherwise stated above. Pertinent History Reviewed:  Reviewed past medical,surgical, social, obstetrical and family history.  Reviewed problem list, medications and  allergies. Physical Assessment:   Vitals:   08/18/22 1123  BP: (!) 139/90  Pulse: (!) 107  Weight: 292 lb (132.5 kg)  Body mass index is 40.73 kg/m.           Physical Examination:   General appearance: alert, well appearing, and in no distress  Mental status: alert, oriented to person, place, and time  Skin: warm & dry   Extremities: Edema: None    Cardiovascular: normal heart rate noted  Respiratory: normal respiratory effort, no distress  Abdomen: gravid, soft, non-tender  Pelvic: Cervical exam deferred         Fetal Status:     Movement: Present    Fetal Surveillance Testing today: sonogram   Chaperone: N/A    Results for orders placed or performed in visit on 08/18/22 (from the past 24 hour(s))  POC Urinalysis Dipstick OB   Collection Time: 08/18/22 11:28 AM  Result Value Ref Range   Color, UA     Clarity, UA     Glucose, UA Negative Negative   Bilirubin, UA     Ketones, UA neg  Spec Grav, UA     Blood, UA neg    pH, UA     POC,PROTEIN,UA Trace Negative, Trace, Small (1+), Moderate (2+), Large (3+), 4+   Urobilinogen, UA     Nitrite, UA neg    Leukocytes, UA Negative Negative   Appearance     Odor      Assessment & Plan:  High-risk pregnancy: [id number] at [redacted]w[redacted]d with an Estimated Date of Delivery: 11/04/22      ICD-10-CM   1. Supervision of high risk pregnancy in third trimester  O09.93 POC Urinalysis Dipstick OB    2. Chronic hypertension affecting pregnancy  O10.919       Meds: No orders of the defined types were placed in this encounter.   Orders:  Orders Placed This Encounter  Procedures   POC Urinalysis Dipstick OB     Labs/procedures today: U/S  Treatment Plan:  per protocol  Reviewed: Preterm labor symptoms and general obstetric precautions including but not limited to vaginal bleeding, contractions, leaking of fluid and fetal movement were reviewed in detail with the patient.  All questions were answered. Does have home bp cuff. Office  bp cuff given: not applicable. Check bp daily let us know if consistently >150 and/or >95.  Follow-up: No follow-ups on file.   Future Appointments  Date Time Provider Department Center  09/12/2022 10:50 AM [facility] NURSE [facility]  09/15/2022  9:15 AM [facility] [place] [facility] None  09/15/2022 10:10 AM Lazaro Arms, MD [facility]  09/19/2022 10:30 AM [facility] NURSE [facility]  09/26/2022  9:50 AM [facility] NURSE [facility]  09/29/2022 10:45 AM [facility] [place] [facility] None  09/29/2022 11:30 AM Lazaro Arms, MD [facility]  10/03/2022 10:10 AM [facility] NURSE [facility]  10/06/2022 10:00 AM [facility] [place] [facility] None  10/06/2022 10:50 AM Myna Hidalgo, DO [facility]  10/10/2022 10:10 AM [facility] NURSE [facility]  10/13/2022  9:15 AM [facility] [place] [facility] None  10/13/2022 10:10 AM Myna Hidalgo, DO [facility]  10/17/2022  9:30 AM [facility] NURSE [facility]  10/20/2022  8:30 AM [facility] [place] [facility] None  10/20/2022  9:30 AM Lazaro Arms, MD [facility]  10/24/2022  9:30 AM [facility] NURSE [facility]  10/27/2022  9:15 AM [facility] [place] [facility] None  10/27/2022 10:10 AM Ladona Rosten, Amaryllis Dyke, MD [facility]    Orders Placed This Encounter  Procedures   POC Urinalysis Dipstick OB   Lazaro Arms  Attending Physician for the Center for [facility] 08/18/2022 12:06 PM

## 2022-08-19 LAB — CBC
Hematocrit: 38.6 % (ref 34.0–46.6)
Hemoglobin: 13.2 g/dL (ref 11.1–15.9)
MCH: 30.6 pg (ref 26.6–33.0)
MCHC: 34.2 g/dL (ref 31.5–35.7)
MCV: 89 fL (ref 79–97)
Platelets: 403 10*3/uL (ref 150–450)
RBC: 4.32 x10E6/uL (ref 3.77–5.28)
RDW: 12.6 % (ref 11.7–15.4)
WBC: 11.7 10*3/uL — ABNORMAL HIGH (ref 3.4–10.8)

## 2022-08-19 LAB — RPR: RPR Ser Ql: NONREACTIVE

## 2022-08-19 LAB — GLUCOSE TOLERANCE, 2 HOURS W/ 1HR
Glucose, 1 hour: 154 mg/dL (ref 70–179)
Glucose, 2 hour: 106 mg/dL (ref 70–152)
Glucose, Fasting: 72 mg/dL (ref 70–91)

## 2022-08-19 LAB — ANTIBODY SCREEN: Antibody Screen: NEGATIVE

## 2022-08-19 LAB — HIV ANTIBODY (ROUTINE TESTING W REFLEX): HIV Screen 4th Generation wRfx: NONREACTIVE

## 2022-08-21 ENCOUNTER — Telehealth: Payer: Self-pay

## 2022-08-21 NOTE — Telephone Encounter (Signed)
Patient wanting to make sure she passed her sugar test and with question regarding Rhogam.  Advised glucola normal and will need Rhogam at next visit.  Pt verbalized understanding.

## 2022-08-21 NOTE — Telephone Encounter (Signed)
Patient called and stated that she would like for someone to call her about her test results.

## 2022-09-08 ENCOUNTER — Encounter: Payer: Self-pay | Admitting: Obstetrics & Gynecology

## 2022-09-08 ENCOUNTER — Ambulatory Visit (INDEPENDENT_AMBULATORY_CARE_PROVIDER_SITE_OTHER): Payer: Medicaid Other | Admitting: Obstetrics & Gynecology

## 2022-09-08 VITALS — BP 138/90 | HR 108 | Wt 294.0 lb

## 2022-09-08 DIAGNOSIS — O0990 Supervision of high risk pregnancy, unspecified, unspecified trimester: Secondary | ICD-10-CM | POA: Diagnosis not present

## 2022-09-08 DIAGNOSIS — O10919 Unspecified pre-existing hypertension complicating pregnancy, unspecified trimester: Secondary | ICD-10-CM | POA: Diagnosis not present

## 2022-09-08 DIAGNOSIS — Z3A31 31 weeks gestation of pregnancy: Secondary | ICD-10-CM

## 2022-09-08 DIAGNOSIS — Z23 Encounter for immunization: Secondary | ICD-10-CM

## 2022-09-08 LAB — POCT URINALYSIS DIPSTICK OB
Blood, UA: NEGATIVE
Glucose, UA: NEGATIVE
Nitrite, UA: NEGATIVE

## 2022-09-08 MED ORDER — LABETALOL HCL 200 MG PO TABS: 200.0000 mg | ORAL_TABLET | Freq: Two times a day (BID) | ORAL | 2 refills | Status: DC

## 2022-09-08 NOTE — Progress Notes (Signed)
HIGH-RISK PREGNANCY VISIT Patient name: Barbara Robertson MRN [id number]  Date of birth: 06/21/1991 Chief Complaint:   Routine Prenatal Visit  History of Present Illness:   Barbara Robertson is a 31 y.o. [id number] female at [redacted]w[redacted]d with an Estimated Date of Delivery: 11/04/22 being seen today for ongoing management of a high-risk pregnancy complicated by chronic hypertension currently on labetalol 100 BID.    Today she reports no complaints. Contractions: Not present.  .  Movement: Present. denies leaking of fluid.      08/18/2022   11:30 AM 05/01/2022    9:48 AM 01/24/2022    9:44 AM 07/15/2021    9:01 AM 04/07/2021    2:57 PM  Depression screen PHQ 2/9  Decreased Interest 1 1 0 0 0  Down, Depressed, Hopeless  0 0 0 0  PHQ - 2 Score 1 1 0 0 0  Altered sleeping 2 1 1     Tired, decreased energy 1 1 1     Change in appetite 1 1 0    Feeling bad or failure about yourself  0 0 0    Trouble concentrating 0 0 1    Moving slowly or fidgety/restless 0 0 0    Suicidal thoughts 0 0 0    PHQ-9 Score 5 4 3           08/18/2022   11:30 AM 05/01/2022    9:49 AM 01/24/2022    9:44 AM 11/25/2019    8:38 AM  GAD 7 : Generalized Anxiety Score  Nervous, Anxious, on Edge 1 0 1 2  Control/stop worrying 1 1 1 3   Worry too much - different things 1 1 1 3   Trouble relaxing 1 1 1 3   Restless 0 0 1 3  Easily annoyed or irritable 1 0 1 1  Afraid - awful might happen 0 0 1 0  Total GAD 7 Score 5 3 7 15   Anxiety Difficulty    Not difficult at all     Review of Systems:   Pertinent items are noted in HPI Denies abnormal vaginal discharge w/ itching/odor/irritation, headaches, visual changes, shortness of breath, chest pain, abdominal pain, severe nausea/vomiting, or problems with urination or bowel movements unless otherwise stated above. Pertinent History Reviewed:  Reviewed past medical,surgical, social, obstetrical and family history.  Reviewed problem list, medications and allergies. Physical Assessment:    Vitals:   09/08/22 1040 09/08/22 1052 09/08/22 1054  BP: (!) 148/89 (!) 155/97 (!) 138/90  Pulse: 100 (!) 102 (!) 108  Weight: 294 lb (133.4 kg)    Body mass index is 41 kg/m.           Physical Examination:   General appearance: alert, well appearing, and in no distress  Mental status: alert, oriented to person, place, and time  Skin: warm & dry   Extremities: Edema: None    Cardiovascular: normal heart rate noted  Respiratory: normal respiratory effort, no distress  Abdomen: gravid, soft, non-tender  Pelvic: Cervical exam deferred         Fetal Status:     Movement: Present    Fetal Surveillance Testing today: FHR 154   Chaperone: N/A    Results for orders placed or performed in visit on 09/08/22 (from the past 24 hour(s))  POC Urinalysis Dipstick OB   Collection Time: 09/08/22 11:04 AM  Result Value Ref Range   Color, UA     Clarity, UA     Glucose, UA Negative Negative  Bilirubin, UA     Ketones, UA trace    Spec Grav, UA     Blood, UA neg    pH, UA     POC,PROTEIN,UA Small (1+) Negative, Trace, Small (1+), Moderate (2+), Large (3+), 4+   Urobilinogen, UA     Nitrite, UA neg    Leukocytes, UA Small (1+) (A) Negative   Appearance     Odor      Assessment & Plan:  High-risk pregnancy: [id number] at [redacted]w[redacted]d with an Estimated Date of Delivery: 11/04/22      ICD-10-CM   1. Supervision of high risk pregnancy, antepartum  O09.90 POC Urinalysis Dipstick OB    2. [redacted] weeks gestation of pregnancy  Z3A.31 POC Urinalysis Dipstick OB    3. Chronic hypertension affecting pregnancy  O10.919    Increase labetalol to 200 BID        Meds:  Meds ordered this encounter  Medications   labetalol (NORMODYNE) 200 MG tablet    Sig: Take 1 tablet (200 mg total) by mouth 2 (two) times daily.    Dispense:  180 tablet    Refill:  2    Orders:  Orders Placed This Encounter  Procedures   RHO (D) Immune Globulin   Tdap vaccine greater than or equal to 7yo IM   POC  Urinalysis Dipstick OB     Labs/procedures today:   Treatment Plan:  increase labetalol 200 BID, begin twice weekly surveillance   Reviewed: Preterm labor symptoms and general obstetric precautions including but not limited to vaginal bleeding, contractions, leaking of fluid and fetal movement were reviewed in detail with the patient.  All questions were answered. Does have home bp cuff. Office bp cuff given: yes. Check bp daily, let us know if consistently >140 and/or >90.  Follow-up: Return for keep scheduled.   Future Appointments  Date Time Provider [place]  09/12/2022 10:50 AM [facility] NURSE [facility]  09/15/2022  9:15 AM [facility] [place] [facility] None  09/15/2022 10:10 AM Florian Buff, MD [facility]  09/19/2022 10:30 AM [facility] NURSE [facility]  09/26/2022  9:50 AM [facility] NURSE [facility]  09/29/2022 10:45 AM [facility] [place] [facility] None  09/29/2022 11:30 AM Florian Buff, MD [facility]  10/03/2022 10:10 AM [facility] NURSE [facility]  10/06/2022 10:00 AM [facility] [place] [facility] None  10/06/2022 10:50 AM Janyth Pupa, DO [facility]  10/10/2022 10:10 AM [facility] NURSE [facility]  10/13/2022  9:15 AM [facility] [place] [facility] None  10/13/2022 10:10 AM Janyth Pupa, DO [facility]  10/17/2022  9:30 AM [facility] NURSE [facility]  10/20/2022  8:30 AM [facility] [place] [facility] None  10/20/2022  9:30 AM Florian Buff, MD [facility]  10/24/2022  9:30 AM [facility] NURSE [facility]  10/27/2022  9:15 AM [facility] [place] [facility] None  10/27/2022 10:10 AM Florian Buff, MD [facility]    Orders Placed This Encounter  Procedures   RHO (D) Immune Globulin   Tdap vaccine greater than or equal to 7yo IM   POC Urinalysis Dipstick OB   Florian Buff  Attending Physician for the Center for [facility] 09/08/2022 11:32 AM

## 2022-09-12 ENCOUNTER — Ambulatory Visit (INDEPENDENT_AMBULATORY_CARE_PROVIDER_SITE_OTHER): Payer: Medicaid Other | Admitting: *Deleted

## 2022-09-12 ENCOUNTER — Other Ambulatory Visit: Payer: Medicaid Other

## 2022-09-12 VITALS — BP 135/84 | HR 106 | Wt 293.4 lb

## 2022-09-12 DIAGNOSIS — Z3A32 32 weeks gestation of pregnancy: Secondary | ICD-10-CM | POA: Diagnosis not present

## 2022-09-12 DIAGNOSIS — I10 Essential (primary) hypertension: Secondary | ICD-10-CM | POA: Diagnosis not present

## 2022-09-12 DIAGNOSIS — Z331 Pregnant state, incidental: Secondary | ICD-10-CM

## 2022-09-12 DIAGNOSIS — Z1389 Encounter for screening for other disorder: Secondary | ICD-10-CM

## 2022-09-12 DIAGNOSIS — O288 Other abnormal findings on antenatal screening of mother: Secondary | ICD-10-CM

## 2022-09-12 DIAGNOSIS — O0993 Supervision of high risk pregnancy, unspecified, third trimester: Secondary | ICD-10-CM | POA: Diagnosis not present

## 2022-09-12 LAB — POCT URINALYSIS DIPSTICK OB
Blood, UA: NEGATIVE
Glucose, UA: NEGATIVE
Nitrite, UA: NEGATIVE
POC,PROTEIN,UA: NEGATIVE

## 2022-09-12 NOTE — Progress Notes (Signed)
   NURSE VISIT- NST  SUBJECTIVE:  Barbara Robertson is a 31 y.o. [phone] female at [redacted]w[redacted]d, here for a NST for pregnancy complicated by [facility].  She reports active fetal movement, contractions: none, vaginal bleeding: none, membranes: intact.   OBJECTIVE:  BP 135/84   Pulse (!) 106   Wt 293 lb 6.4 oz (133.1 kg)   LMP 01/28/2022   BMI 40.92 kg/m   Appears well, no apparent distress  Results for orders placed or performed in visit on 09/12/22 (from the past 24 hour(s))  POC Urinalysis Dipstick OB   Collection Time: 09/12/22 11:25 AM  Result Value Ref Range   Color, UA     Clarity, UA     Glucose, UA Negative Negative   Bilirubin, UA     Ketones, UA trace    Spec Grav, UA     Blood, UA neg    pH, UA     POC,PROTEIN,UA Negative Negative, Trace, Small (1+), Moderate (2+), Large (3+), 4+   Urobilinogen, UA     Nitrite, UA neg    Leukocytes, UA Small (1+) (A) Negative   Appearance     Odor      NST: FHR baseline 145 bpm, Variability: moderate, Accelerations:present, Decelerations:  Absent= Cat 1/reactive Toco: none   ASSESSMENT: [id number] at [redacted]w[redacted]d with CHTN NST reactive  PLAN: EFM strip reviewed by Dr. Elonda Husky   Recommendations: keep next appointment as scheduled    Alice Rieger  09/12/2022 12:20 PM

## 2022-09-14 ENCOUNTER — Other Ambulatory Visit: Payer: Self-pay | Admitting: Obstetrics & Gynecology

## 2022-09-14 DIAGNOSIS — O3509X0 Maternal care for (suspected) other central nervous system malformation or damage in fetus, not applicable or unspecified: Secondary | ICD-10-CM

## 2022-09-14 DIAGNOSIS — O10919 Unspecified pre-existing hypertension complicating pregnancy, unspecified trimester: Secondary | ICD-10-CM

## 2022-09-15 ENCOUNTER — Ambulatory Visit (INDEPENDENT_AMBULATORY_CARE_PROVIDER_SITE_OTHER): Payer: Medicaid Other

## 2022-09-15 ENCOUNTER — Ambulatory Visit (INDEPENDENT_AMBULATORY_CARE_PROVIDER_SITE_OTHER): Payer: Medicaid Other | Admitting: Obstetrics & Gynecology

## 2022-09-15 ENCOUNTER — Encounter: Payer: Self-pay | Admitting: Obstetrics & Gynecology

## 2022-09-15 VITALS — BP 150/90 | HR 109 | Wt 294.0 lb

## 2022-09-15 DIAGNOSIS — Z3A32 32 weeks gestation of pregnancy: Secondary | ICD-10-CM | POA: Diagnosis not present

## 2022-09-15 DIAGNOSIS — O10919 Unspecified pre-existing hypertension complicating pregnancy, unspecified trimester: Secondary | ICD-10-CM | POA: Diagnosis not present

## 2022-09-15 DIAGNOSIS — O0993 Supervision of high risk pregnancy, unspecified, third trimester: Secondary | ICD-10-CM

## 2022-09-15 DIAGNOSIS — Z6791 Unspecified blood type, Rh negative: Secondary | ICD-10-CM

## 2022-09-15 DIAGNOSIS — O3509X0 Maternal care for (suspected) other central nervous system malformation or damage in fetus, not applicable or unspecified: Secondary | ICD-10-CM | POA: Diagnosis not present

## 2022-09-15 DIAGNOSIS — Z331 Pregnant state, incidental: Secondary | ICD-10-CM

## 2022-09-15 DIAGNOSIS — Z1389 Encounter for screening for other disorder: Secondary | ICD-10-CM

## 2022-09-15 LAB — POCT URINALYSIS DIPSTICK OB
Glucose, UA: NEGATIVE
Nitrite, UA: NEGATIVE
POC,PROTEIN,UA: NEGATIVE

## 2022-09-15 MED ORDER — LABETALOL HCL 200 MG PO TABS: 200.0000 mg | ORAL_TABLET | Freq: Three times a day (TID) | ORAL | 2 refills | Status: DC

## 2022-09-15 NOTE — Addendum Note (Signed)
Addended by: Diona Fanti A on: 09/15/2022 11:05 AM   Modules accepted: Orders

## 2022-09-15 NOTE — Progress Notes (Signed)
HIGH-RISK PREGNANCY VISIT Patient name: Barbara Robertson MRN [id number]  Date of birth: 1991-07-11 Chief Complaint:   Routine Prenatal Visit and Pregnancy Ultrasound  History of Present Illness:   Barbara Robertson is a 31 y.o. [id number] female at [redacted]w[redacted]d with an Estimated Date of Delivery: 11/04/22 being seen today for ongoing management of a high-risk pregnancy complicated by [facility] on labetalol 200 BID ^every 8 hours.    Today she reports no complaints. Contractions: Not present.  .  Movement: Present. denies leaking of fluid.      08/18/2022   11:30 AM 05/01/2022    9:48 AM 01/24/2022    9:44 AM 07/15/2021    9:01 AM 04/07/2021    2:57 PM  Depression screen PHQ 2/9  Decreased Interest 1 1 0 0 0  Down, Depressed, Hopeless  0 0 0 0  PHQ - 2 Score 1 1 0 0 0  Altered sleeping 2 1 1     Tired, decreased energy 1 1 1     Change in appetite 1 1 0    Feeling bad or failure about yourself  0 0 0    Trouble concentrating 0 0 1    Moving slowly or fidgety/restless 0 0 0    Suicidal thoughts 0 0 0    PHQ-9 Score 5 4 3           08/18/2022   11:30 AM 05/01/2022    9:49 AM 01/24/2022    9:44 AM 11/25/2019    8:38 AM  GAD 7 : Generalized Anxiety Score  Nervous, Anxious, on Edge 1 0 1 2  Control/stop worrying 1 1 1 3   Worry too much - different things 1 1 1 3   Trouble relaxing 1 1 1 3   Restless 0 0 1 3  Easily annoyed or irritable 1 0 1 1  Afraid - awful might happen 0 0 1 0  Total GAD 7 Score 5 3 7 15   Anxiety Difficulty    Not difficult at all     Review of Systems:   Pertinent items are noted in HPI Denies abnormal vaginal discharge w/ itching/odor/irritation, headaches, visual changes, shortness of breath, chest pain, abdominal pain, severe nausea/vomiting, or problems with urination or bowel movements unless otherwise stated above. Pertinent History Reviewed:  Reviewed past medical,surgical, social, obstetrical and family history.  Reviewed problem list, medications and allergies. Physical  Assessment:   Vitals:   09/15/22 1015  BP: (!) 150/90  Pulse: (!) 109  Weight: 294 lb (133.4 kg)  Body mass index is 41 kg/m.           Physical Examination:   General appearance: alert, well appearing, and in no distress  Mental status: alert, oriented to person, place, and time  Skin: warm & dry   Extremities: Edema: None    Cardiovascular: normal heart rate noted  Respiratory: normal respiratory effort, no distress  Abdomen: gravid, soft, non-tender  Pelvic: Cervical exam deferred         Fetal Status:     Movement: Present    Fetal Surveillance Testing today: BPP 8/8 with normal UAD   Chaperone: N/A    No results found for this or any previous visit (from the past 24 hour(s)).  Assessment & Plan:  High-risk pregnancy: [id number] at [redacted]w[redacted]d with an Estimated Date of Delivery: 11/04/22      ICD-10-CM   1. Supervision of high risk pregnancy in third trimester  O09.93     2. Chronic hypertension  affecting pregnancy  O10.919          Meds:  Meds ordered this encounter  Medications   labetalol (NORMODYNE) 200 MG tablet    Sig: Take 1 tablet (200 mg total) by mouth every 8 (eight) hours.    Dispense:  90 tablet    Refill:  2    Orders: No orders of the defined types were placed in this encounter.    Labs/procedures today: U/S  Treatment Plan:  per protocol    Follow-up: Return for keep scheduled.   Future Appointments  Date Time Provider [place]  09/19/2022 10:30 AM [facility] NURSE [facility]  09/26/2022  9:50 AM [facility] NURSE [facility]  09/29/2022 10:45 AM [facility] [place] [facility] None  09/29/2022 11:30 AM Florian Buff, MD [facility]  10/03/2022 10:10 AM [facility] NURSE [facility]  10/06/2022 10:00 AM [facility] [place] [facility] None  10/06/2022 10:50 AM Janyth Pupa, DO [facility]  10/10/2022 10:10 AM [facility] NURSE [facility]  10/13/2022  9:15 AM [facility] [place] [facility] None  10/13/2022 10:10 AM  Janyth Pupa, DO [facility]  10/17/2022  9:30 AM [facility] NURSE [facility]  10/20/2022  8:30 AM [facility] [place] [facility] None  10/20/2022  9:30 AM Florian Buff, MD [facility]  10/24/2022  9:30 AM [facility] NURSE [facility]  10/27/2022  9:15 AM [facility] [place] [facility] None  10/27/2022 10:10 AM Florian Buff, MD [facility]    No orders of the defined types were placed in this encounter.  Florian Buff  Attending Physician for the Center for [facility] 09/15/2022 10:42 AM

## 2022-09-15 NOTE — Progress Notes (Signed)
[place] 56+4 wks,cephalic,BPP 3/3,IR .51,.88,.41,.66=06%,[id number] placenta gr 3,EFW 2551 g 93%,mild bilateral ventricular megaly left 1.2 cm,right 1.2 cm,FHR 154 BPM

## 2022-09-19 ENCOUNTER — Ambulatory Visit (INDEPENDENT_AMBULATORY_CARE_PROVIDER_SITE_OTHER): Payer: Medicaid Other | Admitting: *Deleted

## 2022-09-19 VITALS — BP 130/81 | HR 96 | Wt 294.5 lb

## 2022-09-19 DIAGNOSIS — O26899 Other specified pregnancy related conditions, unspecified trimester: Secondary | ICD-10-CM

## 2022-09-19 DIAGNOSIS — I10 Essential (primary) hypertension: Secondary | ICD-10-CM | POA: Diagnosis not present

## 2022-09-19 DIAGNOSIS — Z3A33 33 weeks gestation of pregnancy: Secondary | ICD-10-CM | POA: Diagnosis not present

## 2022-09-19 DIAGNOSIS — O0993 Supervision of high risk pregnancy, unspecified, third trimester: Secondary | ICD-10-CM

## 2022-09-19 LAB — POCT URINALYSIS DIPSTICK OB
Blood, UA: NEGATIVE
Glucose, UA: NEGATIVE
Nitrite, UA: NEGATIVE

## 2022-09-19 NOTE — Progress Notes (Signed)
   NURSE VISIT- NST  SUBJECTIVE:  Barbara Robertson is a 31 y.o. [phone] female at [redacted]w[redacted]d, here for a NST for pregnancy complicated by [facility].  She reports active fetal movement, contractions: occasional, vaginal bleeding: none, membranes: intact.   OBJECTIVE:  BP 130/81   Pulse 96   Wt 294 lb 8 oz (133.6 kg)   LMP 01/28/2022   BMI 41.07 kg/m   Appears well, no apparent distress  Results for orders placed or performed in visit on 09/19/22 (from the past 24 hour(s))  POC Urinalysis Dipstick OB   Collection Time: 09/19/22 10:41 AM  Result Value Ref Range   Color, UA     Clarity, UA     Glucose, UA Negative Negative   Bilirubin, UA     Ketones, UA trace    Spec Grav, UA     Blood, UA neg    pH, UA     POC,PROTEIN,UA Small (1+) Negative, Trace, Small (1+), Moderate (2+), Large (3+), 4+   Urobilinogen, UA     Nitrite, UA neg    Leukocytes, UA Trace (A) Negative   Appearance     Odor      NST: FHR baseline 145 bpm, Variability: moderate, Accelerations:present, Decelerations:  Absent= Cat 1/reactive Toco: none   ASSESSMENT: [id number] at [redacted]w[redacted]d with CHTN NST reactive  PLAN: EFM strip reviewed by Knute Neu, CNM, [facility]   Recommendations: keep next appointment as scheduled    Barbara Robertson  09/19/2022 12:42 PM

## 2022-09-23 ENCOUNTER — Other Ambulatory Visit: Payer: Self-pay | Admitting: Obstetrics & Gynecology

## 2022-09-26 ENCOUNTER — Ambulatory Visit (INDEPENDENT_AMBULATORY_CARE_PROVIDER_SITE_OTHER): Payer: Medicaid Other | Admitting: *Deleted

## 2022-09-26 VITALS — BP 135/87 | HR 107 | Wt 296.6 lb

## 2022-09-26 DIAGNOSIS — Z1389 Encounter for screening for other disorder: Secondary | ICD-10-CM

## 2022-09-26 DIAGNOSIS — Z331 Pregnant state, incidental: Secondary | ICD-10-CM

## 2022-09-26 DIAGNOSIS — O288 Other abnormal findings on antenatal screening of mother: Secondary | ICD-10-CM

## 2022-09-26 LAB — POCT URINALYSIS DIPSTICK OB
Glucose, UA: NEGATIVE
Ketones, UA: NEGATIVE
Nitrite, UA: NEGATIVE
POC,PROTEIN,UA: NEGATIVE

## 2022-09-26 NOTE — Progress Notes (Signed)
   NURSE VISIT- NST  SUBJECTIVE:  Barbara Robertson is a 31 y.o. [phone] female at [redacted]w[redacted]d, here for a NST for pregnancy complicated by [facility].  She reports active fetal movement, contractions: irritability, vaginal bleeding: none, membranes: intact.   OBJECTIVE:  BP 135/87   Pulse (!) 107   Wt 296 lb 9.6 oz (134.5 kg)   LMP 01/28/2022   BMI 41.37 kg/m   Appears well, no apparent distress  No results found for this or any previous visit (from the past 24 hour(s)).  NST: FHR baseline 150 bpm, Variability: moderate, Accelerations:present, Decelerations:  Absent= Cat 1/reactive Toco: irritability   ASSESSMENT: [id number] at [redacted]w[redacted]d with CHTN NST reactive  PLAN: EFM strip reviewed by Dr. Elonda Husky   Recommendations: keep next appointment as scheduled    Janece Canterbury  09/26/2022 10:24 AM

## 2022-09-28 ENCOUNTER — Other Ambulatory Visit: Payer: Self-pay | Admitting: Obstetrics & Gynecology

## 2022-09-28 DIAGNOSIS — O3509X0 Maternal care for (suspected) other central nervous system malformation or damage in fetus, not applicable or unspecified: Secondary | ICD-10-CM

## 2022-09-28 DIAGNOSIS — O10919 Unspecified pre-existing hypertension complicating pregnancy, unspecified trimester: Secondary | ICD-10-CM

## 2022-09-29 ENCOUNTER — Ambulatory Visit (INDEPENDENT_AMBULATORY_CARE_PROVIDER_SITE_OTHER): Payer: Medicaid Other

## 2022-09-29 ENCOUNTER — Encounter: Payer: Self-pay | Admitting: Obstetrics & Gynecology

## 2022-09-29 ENCOUNTER — Ambulatory Visit (INDEPENDENT_AMBULATORY_CARE_PROVIDER_SITE_OTHER): Payer: Medicaid Other | Admitting: Obstetrics & Gynecology

## 2022-09-29 VITALS — BP 135/77 | HR 97 | Wt 298.0 lb

## 2022-09-29 DIAGNOSIS — O3509X0 Maternal care for (suspected) other central nervous system malformation or damage in fetus, not applicable or unspecified: Secondary | ICD-10-CM

## 2022-09-29 DIAGNOSIS — O0993 Supervision of high risk pregnancy, unspecified, third trimester: Secondary | ICD-10-CM

## 2022-09-29 DIAGNOSIS — Z3A34 34 weeks gestation of pregnancy: Secondary | ICD-10-CM | POA: Diagnosis not present

## 2022-09-29 DIAGNOSIS — Z23 Encounter for immunization: Secondary | ICD-10-CM | POA: Diagnosis not present

## 2022-09-29 DIAGNOSIS — O10919 Unspecified pre-existing hypertension complicating pregnancy, unspecified trimester: Secondary | ICD-10-CM | POA: Diagnosis not present

## 2022-09-29 DIAGNOSIS — Z3A35 35 weeks gestation of pregnancy: Secondary | ICD-10-CM

## 2022-09-29 NOTE — Progress Notes (Signed)
HIGH-RISK PREGNANCY VISIT Patient name: Barbara Robertson MRN [id number]  Date of birth: [birth year] Chief Complaint:   Routine Prenatal Visit  History of Present Illness:   Barbara Robertson is a 31 y.o. [phone] female at [redacted]w[redacted]d with an Estimated Date of Delivery: 11/04/22 being seen today for ongoing management of a high-risk pregnancy complicated by chronic hypertension currently on labetalol 200 BID.    Today she reports no complaints. Contractions: Irritability. Vag. Bleeding: None.  Movement: Present. denies leaking of fluid.      08/18/2022   11:30 AM 05/01/2022    9:48 AM 01/24/2022    9:44 AM 07/15/2021    9:01 AM 04/07/2021    2:57 PM  Depression screen PHQ 2/9  Decreased Interest 1 1 0 0 0  Down, Depressed, Hopeless  0 0 0 0  PHQ - 2 Score 1 1 0 0 0  Altered sleeping 2 1 1     Tired, decreased energy 1 1 1     Change in appetite 1 1 0    Feeling bad or failure about yourself  0 0 0    Trouble concentrating 0 0 1    Moving slowly or fidgety/restless 0 0 0    Suicidal thoughts 0 0 0    PHQ-9 Score 5 4 3           08/18/2022   11:30 AM 05/01/2022    9:49 AM 01/24/2022    9:44 AM 11/25/2019    8:38 AM  GAD 7 : Generalized Anxiety Score  Nervous, Anxious, on Edge 1 0 1 2  Control/stop worrying 1 1 1 3   Worry too much - different things 1 1 1 3   Trouble relaxing 1 1 1 3   Restless 0 0 1 3  Easily annoyed or irritable 1 0 1 1  Afraid - awful might happen 0 0 1 0  Total GAD 7 Score 5 3 7 15   Anxiety Difficulty    Not difficult at all     Review of Systems:   Pertinent items are noted in HPI Denies abnormal vaginal discharge w/ itching/odor/irritation, headaches, visual changes, shortness of breath, chest pain, abdominal pain, severe nausea/vomiting, or problems with urination or bowel movements unless otherwise stated above. Pertinent History Reviewed:  Reviewed past medical,surgical, social, obstetrical and family history.  Reviewed problem list, medications and  allergies. Physical Assessment:   Vitals:   09/29/22 1200  BP: 135/77  Pulse: 97  Weight: 298 lb (135.2 kg)  Body mass index is 41.56 kg/m.           Physical Examination:   General appearance: alert, well appearing, and in no distress  Mental status: alert, oriented to person, place, and time  Skin: warm & dry   Extremities: Edema: None    Cardiovascular: normal heart rate noted  Respiratory: normal respiratory effort, no distress  Abdomen: gravid, soft, non-tender  Pelvic: Cervical exam deferred         Fetal Status:     Movement: Present    Fetal Surveillance Testing today: BPP 8/8 UAD normal   Chaperone: N/A    No results found for this or any previous visit (from the past 24 hour(s)).  Assessment & Plan:  High-risk pregnancy: [id number] at [redacted]w[redacted]d with an Estimated Date of Delivery: 11/04/22      ICD-10-CM   1. Supervision of high risk pregnancy in third trimester  O09.93     2. Chronic hypertension affecting pregnancy  O10.919  labetalol 200 BID, good control    3. Pregnancy complicated by fetal cerebral ventriculomegaly, single or unspecified fetus  O35.09X0    mild 1.3 cm    4. [redacted] weeks gestation of pregnancy  Z3A.35         Meds: No orders of the defined types were placed in this encounter.   Orders: No orders of the defined types were placed in this encounter.    Labs/procedures today: U/S  Treatment Plan:  twice weekly surveillance IOL 39 weeks or as clinically indicated  Reviewed: Preterm labor symptoms and general obstetric precautions including but not limited to vaginal bleeding, contractions, leaking of fluid and fetal movement were reviewed in detail with the patient.  All questions were answered. Does have home bp cuff. Office bp cuff given: not applicable. Check bp twice daily, let us know if consistently >140 and/or >90.  Follow-up: Return for keep scheduled.   Future Appointments  Date Time Provider [place]  10/03/2022 10:10  AM [facility] NURSE [facility]  10/06/2022 10:00 AM [facility] [place] [facility] None  10/06/2022 10:50 AM Janyth Pupa, DO [facility]  10/10/2022 10:10 AM [facility] NURSE [facility]  10/13/2022  9:15 AM [facility] [place] [facility] None  10/13/2022 10:10 AM Janyth Pupa, DO [facility]  10/17/2022  9:30 AM [facility] NURSE [facility]  10/20/2022  8:30 AM [facility] [place] [facility] None  10/20/2022  9:30 AM Florian Buff, MD [facility]  10/24/2022  9:30 AM [facility] NURSE [facility]  10/27/2022  9:15 AM [facility] [place] [facility] None  10/27/2022 10:10 AM Florian Buff, MD [facility]    No orders of the defined types were placed in this encounter.  Florian Buff  Attending Physician for [facility] 09/29/2022 12:51 PM

## 2022-09-29 NOTE — Progress Notes (Signed)
[place] 02+1 wks,cephalic,BPP 1/1,NBVAPOLID placenta gr 3,mildly dilated cerebral lateral ventricles,left ventricle 1.3 cm, right ventricle 1.47 cm,FHR 148 BPM,AFI 17.2 cm,RI .66,.54,.65=69%

## 2022-10-03 ENCOUNTER — Ambulatory Visit (INDEPENDENT_AMBULATORY_CARE_PROVIDER_SITE_OTHER): Payer: Medicaid Other | Admitting: *Deleted

## 2022-10-03 ENCOUNTER — Other Ambulatory Visit: Payer: Medicaid Other

## 2022-10-03 VITALS — BP 135/84 | HR 84 | Wt 300.4 lb

## 2022-10-03 DIAGNOSIS — O10919 Unspecified pre-existing hypertension complicating pregnancy, unspecified trimester: Secondary | ICD-10-CM

## 2022-10-03 DIAGNOSIS — Z1389 Encounter for screening for other disorder: Secondary | ICD-10-CM

## 2022-10-03 DIAGNOSIS — Z3A35 35 weeks gestation of pregnancy: Secondary | ICD-10-CM

## 2022-10-03 DIAGNOSIS — O0993 Supervision of high risk pregnancy, unspecified, third trimester: Secondary | ICD-10-CM | POA: Diagnosis not present

## 2022-10-03 DIAGNOSIS — Z331 Pregnant state, incidental: Secondary | ICD-10-CM

## 2022-10-03 DIAGNOSIS — O288 Other abnormal findings on antenatal screening of mother: Secondary | ICD-10-CM

## 2022-10-03 LAB — POCT URINALYSIS DIPSTICK OB
Glucose, UA: NEGATIVE
Nitrite, UA: NEGATIVE
POC,PROTEIN,UA: NEGATIVE

## 2022-10-03 NOTE — Progress Notes (Signed)
   NURSE VISIT- NST  SUBJECTIVE:  Barbara Robertson is a 31 y.o. [phone] female at [redacted]w[redacted]d, here for a NST for pregnancy complicated by [facility].  She reports active fetal movement, contractions: irritability, vaginal bleeding: none, membranes: intact.   OBJECTIVE:  BP 135/84   Pulse 84   Wt (!) 300 lb 6.4 oz (136.3 kg)   LMP 01/28/2022   BMI 41.90 kg/m   Appears well, no apparent distress  Results for orders placed or performed in visit on 10/03/22 (from the past 24 hour(s))  POC Urinalysis Dipstick OB   Collection Time: 10/03/22 10:44 AM  Result Value Ref Range   Color, UA     Clarity, UA     Glucose, UA Negative Negative   Bilirubin, UA     Ketones, UA trace    Spec Grav, UA     Blood, UA trace    pH, UA     POC,PROTEIN,UA Negative Negative, Trace, Small (1+), Moderate (2+), Large (3+), 4+   Urobilinogen, UA     Nitrite, UA neg    Leukocytes, UA Small (1+) (A) Negative   Appearance     Odor      NST: FHR baseline 150 bpm, Variability: moderate, Accelerations:present, Decelerations:  Absent= Cat 1/reactive Toco: none   ASSESSMENT: [id number] at [redacted]w[redacted]d with CHTN NST reactive  PLAN: EFM strip reviewed by Dr. Elonda Husky   Recommendations: keep next appointment as scheduled    Alice Rieger  10/03/2022 11:17 AM

## 2022-10-05 ENCOUNTER — Other Ambulatory Visit: Payer: Self-pay | Admitting: Obstetrics & Gynecology

## 2022-10-05 DIAGNOSIS — O10919 Unspecified pre-existing hypertension complicating pregnancy, unspecified trimester: Secondary | ICD-10-CM

## 2022-10-06 ENCOUNTER — Ambulatory Visit (INDEPENDENT_AMBULATORY_CARE_PROVIDER_SITE_OTHER): Payer: Medicaid Other | Admitting: Obstetrics & Gynecology

## 2022-10-06 ENCOUNTER — Other Ambulatory Visit (HOSPITAL_COMMUNITY)
Admission: RE | Admit: 2022-10-06 | Discharge: 2022-10-06 | Disposition: A | Discharge status: Home / Self Care | Payer: Medicaid Other | Source: Ambulatory Visit | Attending: Obstetrics & Gynecology | Admitting: Obstetrics & Gynecology

## 2022-10-06 ENCOUNTER — Ambulatory Visit (INDEPENDENT_AMBULATORY_CARE_PROVIDER_SITE_OTHER): Payer: Medicaid Other

## 2022-10-06 ENCOUNTER — Encounter: Payer: Self-pay | Admitting: Obstetrics & Gynecology

## 2022-10-06 VITALS — BP 136/84 | HR 106 | Wt 299.5 lb

## 2022-10-06 DIAGNOSIS — O0993 Supervision of high risk pregnancy, unspecified, third trimester: Secondary | ICD-10-CM

## 2022-10-06 DIAGNOSIS — Z3A35 35 weeks gestation of pregnancy: Secondary | ICD-10-CM

## 2022-10-06 DIAGNOSIS — O10919 Unspecified pre-existing hypertension complicating pregnancy, unspecified trimester: Secondary | ICD-10-CM | POA: Diagnosis not present

## 2022-10-06 DIAGNOSIS — Z6791 Unspecified blood type, Rh negative: Secondary | ICD-10-CM

## 2022-10-06 LAB — POCT URINALYSIS DIPSTICK OB
Blood, UA: NEGATIVE
Glucose, UA: NEGATIVE
Nitrite, UA: NEGATIVE
POC,PROTEIN,UA: NEGATIVE

## 2022-10-06 NOTE — Progress Notes (Signed)
HIGH-RISK PREGNANCY VISIT Patient name: Barbara Robertson MRN [id number]  Date of birth: 12-28-1990 Chief Complaint:   Routine Prenatal Visit  History of Present Illness:   Barbara Robertson is a 30 y.o. [id number] female at [redacted]w[redacted]d with an Estimated Date of Delivery: 11/04/22 being seen today for ongoing management of a high-risk pregnancy complicated by:  -Chronic HTN -200mg  bid -A neg -Mild ventriculomegaly  Today she reports  irregular contractions.  Concerned about making it to the hospital due to h/o quick labor .   Contractions: Irritability. Vag. Bleeding: None.  Movement: Present. denies leaking of fluid.      08/18/2022   11:30 AM 05/01/2022    9:48 AM 01/24/2022    9:44 AM 07/15/2021    9:01 AM 04/07/2021    2:57 PM  Depression screen PHQ 2/9  Decreased Interest 1 1 0 0 0  Down, Depressed, Hopeless  0 0 0 0  PHQ - 2 Score 1 1 0 0 0  Altered sleeping 2 1 1     Tired, decreased energy 1 1 1     Change in appetite 1 1 0    Feeling bad or failure about yourself  0 0 0    Trouble concentrating 0 0 1    Moving slowly or fidgety/restless 0 0 0    Suicidal thoughts 0 0 0    PHQ-9 Score 5 4 3        Current Outpatient Medications  Medication Instructions   aspirin EC 162 mg, Oral, Daily, Swallow whole.   Blood Pressure Monitor MISC For regular home bp monitoring during pregnancy   labetalol (NORMODYNE) 200 mg, Oral, Every 8 hours   omeprazole (PRILOSEC) 20 mg, Oral, Daily   ondansetron (ZOFRAN) 4 mg, Oral, Every 8 hours PRN   Prenatal Vit-Fe Fumarate-FA (PRENATAL VITAMIN PO) 1 tablet, Oral, Daily   Probiotic CHEW 1 tablet, Oral, Daily     Review of Systems:   Pertinent items are noted in HPI Denies abnormal vaginal discharge w/ itching/odor/irritation, headaches, visual changes, shortness of breath, chest pain, abdominal pain, severe nausea/vomiting, or problems with urination or bowel movements unless otherwise stated above. Pertinent History Reviewed:  Reviewed past  medical,surgical, social, obstetrical and family history.  Reviewed problem list, medications and allergies. Physical Assessment:   Vitals:   10/06/22 1052  BP: 136/84  Pulse: (!) 106  Weight: 299 lb 8 oz (135.9 kg)  Body mass index is 41.77 kg/m.           Physical Examination:   General appearance: alert, well appearing, and in no distress  Mental status: normal mood, behavior, speech, dress, motor activity, and thought processes  Skin: warm & dry   Extremities: Edema: Trace    Cardiovascular: normal heart rate noted  Respiratory: normal respiratory effort, no distress  Abdomen: gravid, soft, non-tender  Pelvic: Cervical exam performed  Dilation: 1 Effacement (%): Thick Station: -3  Fetal Status:     Movement: Present Presentation: Vertex  Fetal Surveillance Testing today: ,cephalic,BPP [id number] placenta gr 3,AFI 18.1 cm,FHR 169 bpm,RI.58,.66,.62=75%,mildly dilated lateral ventricles,right 1.7 cm,left 1.6 cm   Chaperone:  pt declined     Results for orders placed or performed in visit on 10/06/22 (from the past 24 hour(s))  POC Urinalysis Dipstick OB   Collection Time: 10/06/22 10:54 AM  Result Value Ref Range   Color, UA     Clarity, UA     Glucose, UA Negative Negative   Bilirubin, UA     Ketones,  UA trace    Spec Grav, UA     Blood, UA negative    pH, UA     POC,PROTEIN,UA Negative Negative, Trace, Small (1+), Moderate (2+), Large (3+), 4+   Urobilinogen, UA     Nitrite, UA negative    Leukocytes, UA Small (1+) (A) Negative   Appearance     Odor       Assessment & Plan:  High-risk pregnancy: [id number] at [redacted]w[redacted]d with an Estimated Date of Delivery: 11/04/22   1) Chronic HTN -continue current medication -continue antepartum testing, normal today as above -discussed IOL 38-39wk pending BP management  2) Aneg S/p RhoGAM  3) mild ventriculomegaly- stable  Meds: No orders of the defined types were placed in this encounter.   Labs/procedures today: GBS,  GC/C  Treatment Plan:  as outlined above  Reviewed: Preterm labor symptoms and general obstetric precautions including but not limited to vaginal bleeding, contractions, leaking of fluid and fetal movement were reviewed in detail with the patient.  All questions were answered. Pt has home bp cuff. Check bp weekly, let us know if >140/90.   Follow-up: Return in about 1 week (around 10/13/2022) for HROB visit/BPP weekly as scheduled.   Future Appointments  Date Time Provider [place]  10/10/2022 10:10 AM [facility] NURSE [facility]  10/13/2022  9:15 AM [facility] [place] [facility] None  10/13/2022 10:10 AM Janyth Pupa, DO [facility]  10/17/2022  9:30 AM [facility] NURSE [facility]  10/20/2022  9:10 AM [facility] NURSE [facility]  10/20/2022  9:30 AM Florian Buff, MD [facility]  10/24/2022  9:30 AM [facility] NURSE [facility]  10/27/2022  9:15 AM [facility] [place] [facility] None  10/27/2022 10:10 AM Eure, Mertie Clause, MD [facility]    Orders Placed This Encounter  Procedures   POC Urinalysis Dipstick OB    Janyth Pupa, DO Attending [place], [place] for [employer] [place] [facility]

## 2022-10-06 NOTE — Progress Notes (Signed)
[place] 42+6 wks,cephalic,BPP 8/3,[id number] placenta gr 3,AFI 18.1 cm,FHR 169 bpm,RI.58,.66,.62=75%,mildly dilated lateral ventricles,right 1.7 cm,left 1.6 cm

## 2022-10-06 NOTE — Addendum Note (Signed)
Addended by: Jesusita Oka on: 10/06/2022 11:46 AM   Modules accepted: Orders

## 2022-10-08 ENCOUNTER — Encounter: Payer: Self-pay | Admitting: Obstetrics & Gynecology

## 2022-10-09 ENCOUNTER — Inpatient Hospital Stay (HOSPITAL_COMMUNITY)
Admission: AD | Admit: 2022-10-09 | Discharge: 2022-10-09 | Disposition: A | Discharge status: Home / Self Care | Payer: Medicaid Other | Attending: Family Medicine | Admitting: Family Medicine

## 2022-10-09 ENCOUNTER — Encounter (HOSPITAL_COMMUNITY): Payer: Self-pay | Admitting: Family Medicine

## 2022-10-09 ENCOUNTER — Telehealth: Payer: Self-pay

## 2022-10-09 DIAGNOSIS — Z3689 Encounter for other specified antenatal screening: Secondary | ICD-10-CM | POA: Insufficient documentation

## 2022-10-09 DIAGNOSIS — O10919 Unspecified pre-existing hypertension complicating pregnancy, unspecified trimester: Secondary | ICD-10-CM

## 2022-10-09 DIAGNOSIS — O10913 Unspecified pre-existing hypertension complicating pregnancy, third trimester: Secondary | ICD-10-CM | POA: Diagnosis not present

## 2022-10-09 DIAGNOSIS — Z6791 Unspecified blood type, Rh negative: Secondary | ICD-10-CM

## 2022-10-09 DIAGNOSIS — O4703 False labor before 37 completed weeks of gestation, third trimester: Secondary | ICD-10-CM | POA: Diagnosis not present

## 2022-10-09 DIAGNOSIS — Z7901 Long term (current) use of anticoagulants: Secondary | ICD-10-CM | POA: Diagnosis not present

## 2022-10-09 DIAGNOSIS — Z3A36 36 weeks gestation of pregnancy: Secondary | ICD-10-CM | POA: Diagnosis not present

## 2022-10-09 DIAGNOSIS — O0993 Supervision of high risk pregnancy, unspecified, third trimester: Secondary | ICD-10-CM

## 2022-10-09 LAB — COMPREHENSIVE METABOLIC PANEL
ALT: 19 U/L (ref 0–44)
AST: 21 U/L (ref 15–41)
Albumin: 2.8 g/dL — ABNORMAL LOW (ref 3.5–5.0)
Alkaline Phosphatase: 136 U/L — ABNORMAL HIGH (ref 38–126)
Anion gap: 9 (ref 5–15)
BUN: 5 mg/dL — ABNORMAL LOW (ref 6–20)
CO2: 21 mmol/L — ABNORMAL LOW (ref 22–32)
Calcium: 8.8 mg/dL — ABNORMAL LOW (ref 8.9–10.3)
Chloride: 106 mmol/L (ref 98–111)
Creatinine, Ser: 0.59 mg/dL (ref 0.44–1.00)
GFR, Estimated: >60 mL/min (ref >60)
Glucose, Bld: 87 mg/dL (ref 70–99)
Potassium: 3.7 mmol/L (ref 3.5–5.1)
Sodium: 136 mmol/L (ref 135–145)
Total Bilirubin: 0.4 mg/dL (ref 0.3–1.2)
Total Protein: 6.6 g/dL (ref 6.5–8.1)

## 2022-10-09 LAB — CBC
HCT: 36.4 % (ref 36.0–46.0)
Hemoglobin: 12 g/dL (ref 12.0–15.0)
MCH: 28.2 pg (ref 26.0–34.0)
MCHC: 33 g/dL (ref 30.0–36.0)
MCV: 85.6 fL (ref 80.0–100.0)
Platelets: 396 10*3/uL (ref 150–400)
RBC: 4.25 MIL/uL (ref 3.87–5.11)
RDW: 13 % (ref 11.5–15.5)
WBC: 10.8 10*3/uL — ABNORMAL HIGH (ref 4.0–10.5)
nRBC: 0 % (ref 0.0–0.2)

## 2022-10-09 LAB — PROTEIN / CREATININE RATIO, URINE
Creatinine, Urine: 152 mg/dL
Protein Creatinine Ratio: 0.05 mg/mg{Cre} (ref 0.00–0.15)
Total Protein, Urine: 8 mg/dL

## 2022-10-09 LAB — CERVICOVAGINAL ANCILLARY ONLY
Chlamydia: NEGATIVE
Comment: NEGATIVE
Comment: NORMAL
Neisseria Gonorrhea: NEGATIVE

## 2022-10-09 NOTE — MAU Note (Signed)
.  Barbara Robertson is a 31 y.o. at [redacted]w[redacted]d here in MAU reporting: ctx since last night now about 5-6 min. B/p elevated 143/109. On Labetalol . 2cm last week. Denies any vag bleeding or leaking . Good fetal movement reported.   Onset of complaint: last night Pain score: 3 Vitals:   10/09/22 1058  BP: (!) 154/94  Pulse: (!) 102  Resp: 18  Temp: 97.9 F (36.6 C)     FHT:158 Lab orders placed from triage:  u/a

## 2022-10-09 NOTE — Telephone Encounter (Signed)
Patient called and stated that she is having contractions that are 8 minutes apart and her blood pressure is running around 140/100. I spoke with Dr. Nelda Marseille and notified patient that she needs to go to [facility] hospital for evaluation. Patient agreed and verbalized understanding.

## 2022-10-09 NOTE — MAU Provider Note (Signed)
History     CSN: [id number]  Arrival date and time: 10/09/22 1044   Event Date/Time   First Provider Initiated Contact with Patient 10/09/22 1222      Chief Complaint  Patient presents with   Contractions   Hypertension   HPI Barbara Robertson is a 31 y.o. [id number] at [redacted]w[redacted]d who presents to MAU with chief complaint of contractions. This is a new problem, onset yesterday. Patient reports pain score of 3/10. She states historically she is typically 6 cm before she feels pain. She denies vaginal bleeding, leaking of fluid, decreased fetal movement, fever, falls, or recent illness. She was 2.5 cm in the office last week  Patient also reports worsening blood pressure on her home cuff. She denies severe range pressures. Her pregnancy is c/b Chronic Hypertension, managed with Labetalol 200 mg BID, which she took this morning. She denies headache, visual disturbances, RUQ/epigastric pain, new onset swelling or weight gain.  Patient receives care with [facility].  OB History     Gravida  4   Para  2   Term  1   Preterm  1   AB  1   Living  2      SAB  1   IAB  0   Ectopic  0   Multiple  0   Live Births  2        Obstetric Comments  No rhogam with SAB in Nov 2016         Past Medical History:  Diagnosis Date   ADHD (attention deficit hyperactivity disorder)    not on meds   Allergic reaction to alpha-gal    Hypertension    Pregnancy    Seizures ([facility])    last seizure 2016    Past Surgical History:  Procedure Laterality Date   NO PAST SURGERIES      Family History  Problem Relation Age of Onset   Arthritis Mother    Hypertension Mother    COPD Father    Arthritis Father    Heart failure Father    Cancer Maternal Grandmother    Diabetes Maternal Grandmother     Social History   Tobacco Use   Smoking status: Never   Smokeless tobacco: Never  Vaping Use   Vaping Use: Former  Substance Use Topics   Alcohol use: Not Currently     Alcohol/week: 1.0 standard drink of alcohol    Types: 1 Standard drinks or equivalent per week    Comment: monthly   Drug use: No    Allergies:  Allergies  Allergen Reactions   Alpha-Gal Nausea And Vomiting   Food Other (See Comments)    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   Reaction:  Seizures    Prednisone Hives    Medications Prior to Admission  Medication Sig Dispense Refill Last Dose   aspirin EC 81 MG tablet Take 2 tablets (162 mg total) by mouth daily. Swallow whole. 180 tablet 2 10/09/2022   Blood Pressure Monitor MISC For regular home bp monitoring during pregnancy 1 each 0 10/09/2022   labetalol (NORMODYNE) 200 MG tablet Take 1 tablet (200 mg total) by mouth every 8 (eight) hours. 90 tablet 2 10/09/2022   omeprazole (PRILOSEC) 20 MG capsule TAKE 1 CAPSULE (20 MG TOTAL) BY MOUTH DAILY. 90 capsule 2 10/08/2022   Prenatal Vit-Fe Fumarate-FA (PRENATAL VITAMIN PO) Take 1 tablet by mouth daily.   10/09/2022   Probiotic CHEW Chew 1 tablet by mouth  daily.   Past Week   ondansetron (ZOFRAN) 4 MG tablet Take 1 tablet (4 mg total) by mouth every 8 (eight) hours as needed for nausea or vomiting. (Patient not taking: Reported on 10/06/2022) 10 tablet 0     Review of Systems  Gastrointestinal:  Positive for abdominal pain.  All other systems reviewed and are negative.  Physical Exam   Blood pressure 126/74, pulse 89, temperature 97.9 F (36.6 C), resp. rate 18, height 5\' 11"  (1.803 m), weight 135.2 kg, last menstrual period 01/28/2022, SpO2 96 %.  Physical Exam Vitals and nursing note reviewed. Exam conducted with a chaperone present.  Constitutional:      Appearance: Normal appearance. She is obese. She is not ill-appearing.  Cardiovascular:     Rate and Rhythm: Normal rate and regular rhythm.     Pulses: Normal pulses.     Heart sounds: Normal heart sounds.  Pulmonary:     Effort: Pulmonary effort is normal.     Breath sounds: Normal breath sounds.  Abdominal:      Comments: Gravid  Musculoskeletal:     Right lower leg: 1+ Edema present.     Left lower leg: 1+ Edema present.  Skin:    Capillary Refill: Capillary refill takes less than 2 seconds.  Neurological:     Mental Status: She is alert and oriented to person, place, and time.  Psychiatric:        Mood and Affect: Mood normal.        Behavior: Behavior normal.        Thought Content: Thought content normal.        Judgment: Judgment normal.     MAU Course  Procedures  MDM  --EMR reviewed. OB problem list includes hx of Severe Preeclampsia. Patient amenable to [facility] labs --Reactive tracing: baseline 140, mod var, + accels, no decels --Toco: irregular contractions q 3-7 minutes --No severe range BPs or severe symptoms. Patient compliant with Labetalol regimen. PEC labs WNL  Orders Placed This Encounter  Procedures   Protein / creatinine ratio, urine   CBC   Comprehensive metabolic panel   Measure blood pressure   Discharge patient   Patient Vitals for the past 24 hrs:  BP Temp Pulse Resp SpO2 Height Weight  10/09/22 1216 126/74 -- 89 -- -- -- --  10/09/22 1200 131/78 -- 91 -- 96 % -- --  10/09/22 1146 132/82 -- 91 -- -- -- --  10/09/22 1131 138/83 -- (!) 101 -- -- -- --  10/09/22 1119 (!) 143/85 -- 98 -- -- -- --  10/09/22 1058 (!) 154/94 97.9 F (36.6 C) (!) 102 18 -- 5\' 11"  (1.803 m) 135.2 kg   Results for orders placed or performed during the hospital encounter of 10/09/22 (from the past 24 hour(s))  Protein / creatinine ratio, urine     Status: None   Collection Time: 10/09/22 11:12 AM  Result Value Ref Range   Creatinine, Urine 152 mg/dL   Total Protein, Urine 8 mg/dL   Protein Creatinine Ratio 0.05 0.00 - 0.15 mg/mg[Cre]  CBC     Status: Abnormal   Collection Time: 10/09/22 11:12 AM  Result Value Ref Range   WBC 10.8 (H) 4.0 - 10.5 K/uL   RBC 4.25 3.87 - 5.11 MIL/uL   Hemoglobin 12.0 12.0 - 15.0 g/dL   HCT 36.4 36.0 - 46.0 %   MCV 85.6 80.0 - 100.0 fL   MCH 28.2  26.0 - 34.0 pg   MCHC  33.0 30.0 - 36.0 g/dL   RDW 13.0 11.5 - 15.5 %   Platelets 396 150 - 400 K/uL   nRBC 0.0 0.0 - 0.2 %  Comprehensive metabolic panel     Status: Abnormal   Collection Time: 10/09/22 11:12 AM  Result Value Ref Range   Sodium 136 135 - 145 mmol/L   Potassium 3.7 3.5 - 5.1 mmol/L   Chloride 106 98 - 111 mmol/L   CO2 21 (L) 22 - 32 mmol/L   Glucose, Bld 87 70 - 99 mg/dL   BUN 5 (L) 6 - 20 mg/dL   Creatinine, Ser 0.59 0.44 - 1.00 mg/dL   Calcium 8.8 (L) 8.9 - 10.3 mg/dL   Total Protein 6.6 6.5 - 8.1 g/dL   Albumin 2.8 (L) 3.5 - 5.0 g/dL   AST 21 15 - 41 U/L   ALT 19 0 - 44 U/L   Alkaline Phosphatase 136 (H) 38 - 126 U/L   Total Bilirubin 0.4 0.3 - 1.2 mg/dL   GFR, Estimated >60 >60 mL/min   Anion gap 9 5 - 15   Assessment and Plan  --31 y.o. [id number] at [redacted]w[redacted]d  --Reactive tracing --Cervix remains 2.5/thick/ballotable --Chronic HTN well controlled on Labetalol 200 mg BID --PEC labs WNL --Discharge home in stable condition with return precautions  F/U: --Patient has appt for NST tomorrow 10/10/2022 --Next HROB appt is 10/13/2022  Darlina Rumpf, [place], MSN, CNM 10/09/2022, 2:00 PM

## 2022-10-10 ENCOUNTER — Ambulatory Visit (INDEPENDENT_AMBULATORY_CARE_PROVIDER_SITE_OTHER): Payer: Medicaid Other | Admitting: *Deleted

## 2022-10-10 VITALS — BP 127/83 | HR 102 | Wt 298.6 lb

## 2022-10-10 DIAGNOSIS — O288 Other abnormal findings on antenatal screening of mother: Secondary | ICD-10-CM

## 2022-10-10 DIAGNOSIS — Z3A36 36 weeks gestation of pregnancy: Secondary | ICD-10-CM

## 2022-10-10 DIAGNOSIS — O0993 Supervision of high risk pregnancy, unspecified, third trimester: Secondary | ICD-10-CM | POA: Diagnosis not present

## 2022-10-10 DIAGNOSIS — Z1389 Encounter for screening for other disorder: Secondary | ICD-10-CM

## 2022-10-10 DIAGNOSIS — O10919 Unspecified pre-existing hypertension complicating pregnancy, unspecified trimester: Secondary | ICD-10-CM | POA: Diagnosis not present

## 2022-10-10 DIAGNOSIS — Z331 Pregnant state, incidental: Secondary | ICD-10-CM

## 2022-10-10 LAB — POCT URINALYSIS DIPSTICK OB
Glucose, UA: NEGATIVE
Nitrite, UA: NEGATIVE

## 2022-10-10 LAB — CULTURE, BETA STREP (GROUP B ONLY): Strep Gp B Culture: POSITIVE — AB

## 2022-10-10 NOTE — Progress Notes (Signed)
   NURSE VISIT- NST  SUBJECTIVE:  Barbara Robertson is a 31 y.o. [phone] female at [redacted]w[redacted]d, here for a NST for pregnancy complicated by [facility].  She reports active fetal movement, contractions: irregular, every 8 minutes, vaginal bleeding: none, membranes: intact.   OBJECTIVE:  BP 127/83   Pulse (!) 102   Wt 298 lb 9.6 oz (135.4 kg)   LMP 01/28/2022   BMI 41.65 kg/m   Appears well, no apparent distress  Results for orders placed or performed in visit on 10/10/22 (from the past 24 hour(s))  POC Urinalysis Dipstick OB   Collection Time: 10/10/22 10:27 AM  Result Value Ref Range   Color, UA     Clarity, UA     Glucose, UA Negative Negative   Bilirubin, UA     Ketones, UA trace    Spec Grav, UA     Blood, UA trace    pH, UA     POC,PROTEIN,UA Trace Negative, Trace, Small (1+), Moderate (2+), Large (3+), 4+   Urobilinogen, UA     Nitrite, UA neg    Leukocytes, UA Small (1+) (A) Negative   Appearance     Odor      NST: FHR baseline 150 bpm, Variability: moderate, Accelerations:present, Decelerations:  Absent= Cat 1/reactive Toco: irregular   ASSESSMENT: [id number] at [redacted]w[redacted]d with CHTN NST reactive  PLAN: EFM strip reviewed by Dr. Elonda Husky   Recommendations: keep next appointment as scheduled    Alice Rieger  10/10/2022 11:14 AM

## 2022-10-12 ENCOUNTER — Other Ambulatory Visit: Payer: Self-pay | Admitting: Obstetrics & Gynecology

## 2022-10-12 DIAGNOSIS — O10919 Unspecified pre-existing hypertension complicating pregnancy, unspecified trimester: Secondary | ICD-10-CM

## 2022-10-13 ENCOUNTER — Encounter: Payer: Self-pay | Admitting: Obstetrics & Gynecology

## 2022-10-13 ENCOUNTER — Encounter (HOSPITAL_COMMUNITY): Payer: Self-pay

## 2022-10-13 ENCOUNTER — Telehealth (HOSPITAL_COMMUNITY): Payer: Self-pay | Admitting: *Deleted

## 2022-10-13 ENCOUNTER — Other Ambulatory Visit: Payer: Self-pay | Admitting: Advanced Practice Midwife

## 2022-10-13 ENCOUNTER — Ambulatory Visit (INDEPENDENT_AMBULATORY_CARE_PROVIDER_SITE_OTHER): Payer: Medicaid Other

## 2022-10-13 ENCOUNTER — Ambulatory Visit (INDEPENDENT_AMBULATORY_CARE_PROVIDER_SITE_OTHER): Payer: Medicaid Other | Admitting: Obstetrics & Gynecology

## 2022-10-13 VITALS — BP 138/86 | HR 114 | Wt 300.0 lb

## 2022-10-13 DIAGNOSIS — O10919 Unspecified pre-existing hypertension complicating pregnancy, unspecified trimester: Secondary | ICD-10-CM | POA: Diagnosis not present

## 2022-10-13 DIAGNOSIS — O26899 Other specified pregnancy related conditions, unspecified trimester: Secondary | ICD-10-CM

## 2022-10-13 DIAGNOSIS — Z3A36 36 weeks gestation of pregnancy: Secondary | ICD-10-CM | POA: Diagnosis not present

## 2022-10-13 DIAGNOSIS — O0993 Supervision of high risk pregnancy, unspecified, third trimester: Secondary | ICD-10-CM

## 2022-10-13 DIAGNOSIS — Z1389 Encounter for screening for other disorder: Secondary | ICD-10-CM

## 2022-10-13 DIAGNOSIS — Z331 Pregnant state, incidental: Secondary | ICD-10-CM

## 2022-10-13 DIAGNOSIS — Z6791 Unspecified blood type, Rh negative: Secondary | ICD-10-CM

## 2022-10-13 LAB — POCT URINALYSIS DIPSTICK OB
Blood, UA: NEGATIVE
Glucose, UA: NEGATIVE
Ketones, UA: NEGATIVE
Nitrite, UA: NEGATIVE
POC,PROTEIN,UA: NEGATIVE

## 2022-10-13 NOTE — Progress Notes (Signed)
[place] 27+6 wks,cephalic,BPP 1/4,[id number] placenta gr 3,AFI 17.5 cm,FHR 150 bpm,RI .63,.59,.61,.62=70%,EFW 3588 g 93%

## 2022-10-13 NOTE — Progress Notes (Signed)
HIGH-RISK PREGNANCY VISIT Patient name: Barbara Robertson MRN [id number]  Date of birth: [birth year] Chief Complaint:   High Risk Gestation, Routine Prenatal Visit, and Pregnancy Ultrasound (Want cervix check)  History of Present Illness:   Barbara Robertson is a 31 y.o. [phone] female at [redacted]w[redacted]d with an Estimated Date of Delivery: 11/04/22 being seen today for ongoing management of a high-risk pregnancy complicated by:  -Chronic HTN on Labetalol 200mg  bid -h/o seizures- no meds -GBS positive -A neg, s/p rhoGAM  Today she reports no complaints.   Contractions: Regular. Vag. Bleeding: None.  Movement: Present. denies leaking of fluid.      08/18/2022   11:30 AM 05/01/2022    9:48 AM 01/24/2022    9:44 AM 07/15/2021    9:01 AM 04/07/2021    2:57 PM  Depression screen PHQ 2/9  Decreased Interest 1 1 0 0 0  Down, Depressed, Hopeless  0 0 0 0  PHQ - 2 Score 1 1 0 0 0  Altered sleeping 2 1 1     Tired, decreased energy 1 1 1     Change in appetite 1 1 0    Feeling bad or failure about yourself  0 0 0    Trouble concentrating 0 0 1    Moving slowly or fidgety/restless 0 0 0    Suicidal thoughts 0 0 0    PHQ-9 Score 5 4 3        Current Outpatient Medications  Medication Instructions   aspirin EC 162 mg, Oral, Daily, Swallow whole.   Blood Pressure Monitor MISC For regular home bp monitoring during pregnancy   labetalol (NORMODYNE) 200 mg, Oral, Every 8 hours   omeprazole (PRILOSEC) 20 mg, Oral, Daily   ondansetron (ZOFRAN) 4 mg, Oral, Every 8 hours PRN   Prenatal Vit-Fe Fumarate-FA (PRENATAL VITAMIN PO) 1 tablet, Oral, Daily   Probiotic CHEW 1 tablet, Oral, Daily     Review of Systems:   Pertinent items are noted in HPI Denies abnormal vaginal discharge w/ itching/odor/irritation, headaches, visual changes, shortness of breath, chest pain, abdominal pain, severe nausea/vomiting, or problems with urination or bowel movements unless otherwise stated above. Pertinent History Reviewed:   Reviewed past medical,surgical, social, obstetrical and family history.  Reviewed problem list, medications and allergies. Physical Assessment:   Vitals:   10/13/22 1002  BP: 138/86  Pulse: (!) 114  Weight: 300 lb (136.1 kg)  Body mass index is 41.84 kg/m.           Physical Examination:   General appearance: alert, well appearing, and in no distress  Mental status: normal mood, behavior, speech, dress, motor activity, and thought processes  Skin: warm & dry   Extremities:      Cardiovascular: normal heart rate noted  Respiratory: normal respiratory effort, no distress  Abdomen: gravid, soft, non-tender  Pelvic:  unchanged from prior exam   Dilation: 1 Effacement (%): 20 Station: -3  Fetal Status:     Movement: Present Presentation: Vertex  Fetal Surveillance Testing today: cephalic,BPP 0/2,HENIDPOEU placenta gr 3,AFI 17.5 cm,FHR 150 bpm,RI .63,.59,.61,.62=70%,EFW 3588 g 93%    Chaperone:  pt declined     No results found for this or any previous visit (from the past 24 hour(s)).   Assessment & Plan:  High-risk pregnancy: [id number] at [redacted]w[redacted]d with an Estimated Date of Delivery: 11/04/22   1) Chronic HTN -continue current medication -BPP 8/8 as above. LGA noted.  Continue antepartum testing -plan for IOL 38-39wks, scheduled for 11/12  -  h/o seizures- no meds -GBS positive -A neg, s/p rhoGAM  Meds: No orders of the defined types were placed in this encounter.   Labs/procedures today: BPP/doppler as Jethro Poling  Treatment Plan:  as outlined above, IOL scheduled  Reviewed: Term labor symptoms and general obstetric precautions including but not limited to vaginal bleeding, contractions, leaking of fluid and fetal movement were reviewed in detail with the patient.  All questions were answered. Pt has home bp cuff. Check bp weekly, let us know if >140/90.   Follow-up: Return for as scheduled.   Future Appointments  Date Time Provider [place]  10/17/2022  9:30 AM  [facility] NURSE [facility]  10/20/2022  9:10 AM [facility] NURSE [facility]  10/20/2022  9:30 AM Florian Buff, MD [facility]    No orders of the defined types were placed in this encounter.   Janyth Pupa, DO Attending [place], [facility] for [employer], [place]

## 2022-10-13 NOTE — Addendum Note (Signed)
Addended by: Diona Fanti A on: 10/13/2022 10:51 AM   Modules accepted: Orders

## 2022-10-13 NOTE — Telephone Encounter (Signed)
Preadmission screen  

## 2022-10-16 ENCOUNTER — Telehealth (HOSPITAL_COMMUNITY): Payer: Self-pay | Admitting: *Deleted

## 2022-10-16 NOTE — Telephone Encounter (Signed)
Preadmission screen  

## 2022-10-17 ENCOUNTER — Ambulatory Visit (INDEPENDENT_AMBULATORY_CARE_PROVIDER_SITE_OTHER): Payer: Medicaid Other | Admitting: *Deleted

## 2022-10-17 VITALS — BP 132/76 | HR 102 | Wt 298.8 lb

## 2022-10-17 DIAGNOSIS — Z3A37 37 weeks gestation of pregnancy: Secondary | ICD-10-CM | POA: Diagnosis not present

## 2022-10-17 DIAGNOSIS — Z1389 Encounter for screening for other disorder: Secondary | ICD-10-CM | POA: Diagnosis not present

## 2022-10-17 DIAGNOSIS — Z331 Pregnant state, incidental: Secondary | ICD-10-CM

## 2022-10-17 LAB — POCT URINALYSIS DIPSTICK OB
Blood, UA: NEGATIVE
Glucose, UA: NEGATIVE
Nitrite, UA: NEGATIVE
POC,PROTEIN,UA: NEGATIVE

## 2022-10-17 NOTE — Progress Notes (Signed)
   NURSE VISIT- NST  SUBJECTIVE:  Barbara Robertson is a 31 y.o. [phone] female at [redacted]w[redacted]d, here for a NST for pregnancy complicated by [facility].  She reports active fetal movement, contractions: none, vaginal bleeding: none, membranes: intact.   OBJECTIVE:  BP 132/76   Pulse (!) 102   Wt 298 lb 12.8 oz (135.5 kg)   LMP 01/28/2022   BMI 41.67 kg/m   Appears well, no apparent distress  Results for orders placed or performed in visit on 10/17/22 (from the past 24 hour(s))  POC Urinalysis Dipstick OB   Collection Time: 10/17/22  8:57 AM  Result Value Ref Range   Color, UA     Clarity, UA     Glucose, UA Negative Negative   Bilirubin, UA     Ketones, UA trace    Spec Grav, UA     Blood, UA neg    pH, UA     POC,PROTEIN,UA Negative Negative, Trace, Small (1+), Moderate (2+), Large (3+), 4+   Urobilinogen, UA     Nitrite, UA neg    Leukocytes, UA Large (3+) (A) Negative   Appearance     Odor      NST: FHR baseline 150 bpm, Variability: moderate, Accelerations:present, Decelerations:  Absent= Cat 1/reactive Toco: none   ASSESSMENT: [id number] at [redacted]w[redacted]d with CHTN NST reactive  PLAN: EFM strip reviewed by Knute Neu, CNM, [facility]   Recommendations: keep next appointment as scheduled    Janece Canterbury  10/17/2022 9:20 AM

## 2022-10-18 ENCOUNTER — Other Ambulatory Visit: Payer: Self-pay | Admitting: Advanced Practice Midwife

## 2022-10-18 DIAGNOSIS — O10919 Unspecified pre-existing hypertension complicating pregnancy, unspecified trimester: Secondary | ICD-10-CM

## 2022-10-19 ENCOUNTER — Encounter (HOSPITAL_COMMUNITY): Payer: Self-pay | Admitting: *Deleted

## 2022-10-19 ENCOUNTER — Telehealth (HOSPITAL_COMMUNITY): Payer: Self-pay | Admitting: *Deleted

## 2022-10-19 NOTE — Telephone Encounter (Signed)
Preadmission screen  

## 2022-10-20 ENCOUNTER — Encounter: Payer: Self-pay | Admitting: Obstetrics & Gynecology

## 2022-10-20 ENCOUNTER — Other Ambulatory Visit: Payer: Medicaid Other

## 2022-10-20 ENCOUNTER — Ambulatory Visit (INDEPENDENT_AMBULATORY_CARE_PROVIDER_SITE_OTHER): Payer: Medicaid Other | Admitting: Obstetrics & Gynecology

## 2022-10-20 VITALS — BP 132/85 | HR 103 | Wt 301.2 lb

## 2022-10-20 DIAGNOSIS — Z3A37 37 weeks gestation of pregnancy: Secondary | ICD-10-CM

## 2022-10-20 DIAGNOSIS — O10919 Unspecified pre-existing hypertension complicating pregnancy, unspecified trimester: Secondary | ICD-10-CM

## 2022-10-20 DIAGNOSIS — O0993 Supervision of high risk pregnancy, unspecified, third trimester: Secondary | ICD-10-CM

## 2022-10-20 DIAGNOSIS — Z1389 Encounter for screening for other disorder: Secondary | ICD-10-CM | POA: Diagnosis not present

## 2022-10-20 DIAGNOSIS — Z331 Pregnant state, incidental: Secondary | ICD-10-CM

## 2022-10-20 LAB — POCT URINALYSIS DIPSTICK OB
Blood, UA: NEGATIVE
Glucose, UA: NEGATIVE
Ketones, UA: NEGATIVE
Leukocytes, UA: NEGATIVE
Nitrite, UA: NEGATIVE
POC,PROTEIN,UA: NEGATIVE

## 2022-10-20 NOTE — Progress Notes (Signed)
HIGH-RISK PREGNANCY VISIT Patient name: Barbara Robertson MRN [id number]  Date of birth: May 24, 1991 Chief Complaint:   Routine Prenatal Visit, High Risk Gestation, and Non-stress Test  History of Present Illness:   Barbara Robertson is a 31 y.o. [id number] female at [redacted]w[redacted]d with an Estimated Date of Delivery: 11/04/22 being seen today for ongoing management of a high-risk pregnancy complicated by chronic hypertension currently on labetalol 200 q8h.    Today she reports no complaints. Contractions: Regular.  .  Movement: Present. denies leaking of fluid.      08/18/2022   11:30 AM 05/01/2022    9:48 AM 01/24/2022    9:44 AM 07/15/2021    9:01 AM 04/07/2021    2:57 PM  Depression screen PHQ 2/9  Decreased Interest 1 1 0 0 0  Down, Depressed, Hopeless  0 0 0 0  PHQ - 2 Score 1 1 0 0 0  Altered sleeping 2 1 1     Tired, decreased energy 1 1 1     Change in appetite 1 1 0    Feeling bad or failure about yourself  0 0 0    Trouble concentrating 0 0 1    Moving slowly or fidgety/restless 0 0 0    Suicidal thoughts 0 0 0    PHQ-9 Score 5 4 3           08/18/2022   11:30 AM 05/01/2022    9:49 AM 01/24/2022    9:44 AM 11/25/2019    8:38 AM  GAD 7 : Generalized Anxiety Score  Nervous, Anxious, on Edge 1 0 1 2  Control/stop worrying 1 1 1 3   Worry too much - different things 1 1 1 3   Trouble relaxing 1 1 1 3   Restless 0 0 1 3  Easily annoyed or irritable 1 0 1 1  Afraid - awful might happen 0 0 1 0  Total GAD 7 Score 5 3 7 15   Anxiety Difficulty    Not difficult at all     Review of Systems:   Pertinent items are noted in HPI Denies abnormal vaginal discharge w/ itching/odor/irritation, headaches, visual changes, shortness of breath, chest pain, abdominal pain, severe nausea/vomiting, or problems with urination or bowel movements unless otherwise stated above. Pertinent History Reviewed:  Reviewed past medical,surgical, social, obstetrical and family history.  Reviewed problem list, medications  and allergies. Physical Assessment:   Vitals:   10/20/22 0903  BP: 132/85  Pulse: (!) 103  Weight: (!) 301 lb 3.2 oz (136.6 kg)  Body mass index is 42.01 kg/m.           Physical Examination:   General appearance: alert, well appearing, and in no distress  Mental status: alert, oriented to person, place, and time  Skin: warm & dry   Extremities: Edema: None    Cardiovascular: normal heart rate noted  Respiratory: normal respiratory effort, no distress  Abdomen: gravid, soft, non-tender  Pelvic: Cervical exam deferred         Fetal Status:     Movement: Present    Fetal Surveillance Testing today: Barbara Robertson is at [redacted]w[redacted]d Estimated Date of Delivery: 11/04/22  NST being performed due to [facility]  Today the NST is Reactive  Fetal Monitoring:  Baseline: 140 bpm, Variability: Good {> 6 bpm), Accelerations: Reactive, and Decelerations: Absent   reactive  The accelerations are >15 bpm and more than 2 in 20 minutes  Final diagnosis:  Reactive NST   Despina Hidden, MD     Chaperone: N/A    Results for orders placed or performed in visit on 10/20/22 (from the past 24 hour(s))  POC Urinalysis Dipstick OB   Collection Time: 10/20/22  9:28 AM  Result Value Ref Range   Color, UA     Clarity, UA     Glucose, UA Negative Negative   Bilirubin, UA     Ketones, UA neg    Spec Grav, UA     Blood, UA neg    pH, UA     POC,PROTEIN,UA Negative Negative, Trace, Small (1+), Moderate (2+), Large (3+), 4+   Urobilinogen, UA     Nitrite, UA neg    Leukocytes, UA Negative Negative   Appearance     Odor      Assessment & Plan:  High-risk pregnancy: [id number] at [redacted]w[redacted]d with an Estimated Date of Delivery: 11/04/22      ICD-10-CM   1. Supervision of high risk pregnancy in third trimester  O09.93     2. Chronic hypertension affecting pregnancy  O10.919    labetalol 200 q8h with good control    3. Pregnant state, incidental  Z33.1 POC Urinalysis Dipstick OB    4. Screening for  genitourinary condition  Z13.89 POC Urinalysis Dipstick OB        Meds: No orders of the defined types were placed in this encounter.   Orders:  Orders Placed This Encounter  Procedures   POC Urinalysis Dipstick OB     Labs/procedures today: NST  Treatment Plan:  IOL 11/12  Reviewed: Preterm labor symptoms and general obstetric precautions including but not limited to vaginal bleeding, contractions, leaking of fluid and fetal movement were reviewed in detail with the patient.  All questions were answered. Does have home bp cuff. Office bp cuff given: not applicable. Check bp twice daily, let us know if consistently >150 and/or >95.  Follow-up: Return in about 2 weeks (around 11/03/2022) for BP check.   Future Appointments  Date Time Provider Department Center  10/22/2022  6:45 AM [facility] SCHED ROOM [facility] None  10/30/2022  2:50 PM [facility] NURSE [facility]  11/27/2022  1:30 PM Cheral Marker, CNM [facility]    Orders Placed This Encounter  Procedures   POC Urinalysis Dipstick OB   Lazaro Arms  Attending Physician for the Center for [facility] 10/20/2022 9:51 AM

## 2022-10-22 ENCOUNTER — Encounter (HOSPITAL_COMMUNITY): Payer: Self-pay | Admitting: Obstetrics and Gynecology

## 2022-10-22 ENCOUNTER — Inpatient Hospital Stay (HOSPITAL_COMMUNITY): Payer: Medicaid Other

## 2022-10-22 ENCOUNTER — Inpatient Hospital Stay (HOSPITAL_COMMUNITY)
Admission: RE | Admit: 2022-10-22 | Discharge: 2022-10-24 | DRG: 807 | Disposition: A | Discharge status: Home / Self Care | Payer: Medicaid Other | Attending: Obstetrics and Gynecology | Admitting: Obstetrics and Gynecology

## 2022-10-22 DIAGNOSIS — Z6791 Unspecified blood type, Rh negative: Secondary | ICD-10-CM

## 2022-10-22 DIAGNOSIS — O3509X0 Maternal care for (suspected) other central nervous system malformation or damage in fetus, not applicable or unspecified: Secondary | ICD-10-CM | POA: Diagnosis present

## 2022-10-22 DIAGNOSIS — O1002 Pre-existing essential hypertension complicating childbirth: Secondary | ICD-10-CM | POA: Diagnosis not present

## 2022-10-22 DIAGNOSIS — Z3A38 38 weeks gestation of pregnancy: Secondary | ICD-10-CM | POA: Diagnosis not present

## 2022-10-22 DIAGNOSIS — O0993 Supervision of high risk pregnancy, unspecified, third trimester: Principal | ICD-10-CM

## 2022-10-22 DIAGNOSIS — O26893 Other specified pregnancy related conditions, third trimester: Secondary | ICD-10-CM | POA: Diagnosis not present

## 2022-10-22 DIAGNOSIS — O99824 Streptococcus B carrier state complicating childbirth: Secondary | ICD-10-CM | POA: Diagnosis not present

## 2022-10-22 DIAGNOSIS — O3509X1 Maternal care for (suspected) other central nervous system malformation or damage in fetus, fetus 1: Secondary | ICD-10-CM | POA: Diagnosis not present

## 2022-10-22 DIAGNOSIS — O0990 Supervision of high risk pregnancy, unspecified, unspecified trimester: Secondary | ICD-10-CM

## 2022-10-22 DIAGNOSIS — O26899 Other specified pregnancy related conditions, unspecified trimester: Secondary | ICD-10-CM

## 2022-10-22 DIAGNOSIS — O10919 Unspecified pre-existing hypertension complicating pregnancy, unspecified trimester: Secondary | ICD-10-CM | POA: Diagnosis present

## 2022-10-22 DIAGNOSIS — O09299 Supervision of pregnancy with other poor reproductive or obstetric history, unspecified trimester: Secondary | ICD-10-CM

## 2022-10-22 DIAGNOSIS — Z7982 Long term (current) use of aspirin: Secondary | ICD-10-CM

## 2022-10-22 LAB — CBC
HCT: 34.3 % — ABNORMAL LOW (ref 36.0–46.0)
Hemoglobin: 11.6 g/dL — ABNORMAL LOW (ref 12.0–15.0)
MCH: 28.2 pg (ref 26.0–34.0)
MCHC: 33.8 g/dL (ref 30.0–36.0)
MCV: 83.5 fL (ref 80.0–100.0)
Platelets: 387 10*3/uL (ref 150–400)
RBC: 4.11 MIL/uL (ref 3.87–5.11)
RDW: 13.4 % (ref 11.5–15.5)
WBC: 9.4 10*3/uL (ref 4.0–10.5)
nRBC: 0 % (ref 0.0–0.2)

## 2022-10-22 LAB — TYPE AND SCREEN
ABO/RH(D): A NEG
Antibody Screen: NEGATIVE

## 2022-10-22 MED ORDER — OXYTOCIN BOLUS FROM INFUSION
333.0000 mL | Freq: Once | INTRAVENOUS | Status: AC
  Administered 2022-10-23: 333 mL via INTRAVENOUS

## 2022-10-22 MED ORDER — TERBUTALINE SULFATE 1 MG/ML IJ SOLN: 0.2500 mg | Freq: Once | INTRAMUSCULAR | Status: DC | PRN

## 2022-10-22 MED ORDER — MISOPROSTOL 50MCG HALF TABLET: 50.0000 ug | ORAL_TABLET | Freq: Once | ORAL | Status: DC

## 2022-10-22 MED ORDER — ACETAMINOPHEN 325 MG PO TABS: 650.0000 mg | ORAL_TABLET | ORAL | Status: DC | PRN

## 2022-10-22 MED ORDER — SODIUM CHLORIDE 0.9 % IV SOLN
5.0000 10*6.[IU] | Freq: Once | INTRAVENOUS | Status: AC
  Administered 2022-10-22: 5 10*6.[IU] via INTRAVENOUS
  Filled 2022-10-22: qty 5

## 2022-10-22 MED ORDER — MISOPROSTOL 25 MCG QUARTER TABLET: 25.0000 ug | ORAL_TABLET | Freq: Once | ORAL | Status: DC

## 2022-10-22 MED ORDER — OXYCODONE-ACETAMINOPHEN 5-325 MG PO TABS: 1.0000 | ORAL_TABLET | ORAL | Status: DC | PRN

## 2022-10-22 MED ORDER — OXYTOCIN-SODIUM CHLORIDE 30-0.9 UT/500ML-% IV SOLN
1.0000 m[IU]/min | INTRAVENOUS | Status: DC
  Administered 2022-10-22: 2 m[IU]/min via INTRAVENOUS
  Filled 2022-10-22: qty 500

## 2022-10-22 MED ORDER — ONDANSETRON HCL 4 MG/2ML IJ SOLN: 4.0000 mg | Freq: Four times a day (QID) | INTRAMUSCULAR | Status: DC | PRN

## 2022-10-22 MED ORDER — LACTATED RINGERS IV SOLN: INTRAVENOUS | Status: DC

## 2022-10-22 MED ORDER — OXYTOCIN-SODIUM CHLORIDE 30-0.9 UT/500ML-% IV SOLN: 2.5000 [IU]/h | INTRAVENOUS | Status: DC

## 2022-10-22 MED ORDER — SOD CITRATE-CITRIC ACID 500-334 MG/5ML PO SOLN: 30.0000 mL | ORAL | Status: DC | PRN

## 2022-10-22 MED ORDER — LABETALOL HCL 200 MG PO TABS
200.0000 mg | ORAL_TABLET | Freq: Three times a day (TID) | ORAL | Status: DC
  Administered 2022-10-22: 200 mg via ORAL
  Filled 2022-10-22: qty 1

## 2022-10-22 MED ORDER — OXYCODONE-ACETAMINOPHEN 5-325 MG PO TABS: 2.0000 | ORAL_TABLET | ORAL | Status: DC | PRN

## 2022-10-22 MED ORDER — LACTATED RINGERS IV SOLN: 500.0000 mL | INTRAVENOUS | Status: DC | PRN

## 2022-10-22 MED ORDER — PENICILLIN G POT IN DEXTROSE 60000 UNIT/ML IV SOLN
3.0000 10*6.[IU] | INTRAVENOUS | Status: DC
  Administered 2022-10-22: 3 10*6.[IU] via INTRAVENOUS
  Filled 2022-10-22 (×4): qty 50

## 2022-10-22 MED ORDER — LIDOCAINE HCL (PF) 1 % IJ SOLN: 30.0000 mL | INTRAMUSCULAR | Status: DC | PRN

## 2022-10-22 MED ORDER — FENTANYL CITRATE (PF) 100 MCG/2ML IJ SOLN
100.0000 ug | INTRAMUSCULAR | Status: DC | PRN
  Administered 2022-10-23: 100 ug via INTRAVENOUS
  Filled 2022-10-22: qty 2

## 2022-10-22 NOTE — H&P (Cosign Needed Addendum)
OBSTETRIC ADMISSION HISTORY AND PHYSICAL  Barbara Robertson is a 31 y.o. female [phone] with IUP at [redacted]w[redacted]d by LMP presenting for IOL for cHTN. She reports +FMs, No LOF, no VB, no blurry vision, headaches or peripheral edema, and RUQ pain.  She plans on breast feeding. She request Nexplanon if SVD, BTL if pLTCS for birth control. She received her prenatal care at [facility]   Dating: By LMP --->  Estimated Date of Delivery: 11/04/22  Sono:   @[redacted]w[redacted]d , CWD, mild stable ventriculomegaly (1.2cm on the right, 1.5cm on the left) , cephalic presentation, posterior placenta, 3588g, 93% EFW    Prenatal History/Complications: -cHTN: on labetalol 200mg  every 8 hours.  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) -H/o seizures: no meds last seizure activity 2001 -H/o preeclampsia: s/p ASA, baseline labs wnl  -RH neg  Past Medical History: Past Medical History:  Diagnosis Date   ADHD (attention deficit hyperactivity disorder)    not on meds   Allergic reaction to alpha-gal    History of gestational hypertension    Hypertension    Pregnancy    Seizures (HCC)    last seizure 2016   Past Surgical History: Past Surgical History:  Procedure Laterality Date   NO PAST SURGERIES     Obstetrical History: OB History     Gravida  4   Para  2   Term  1   Preterm  1   AB  1   Living  2      SAB  1   IAB  0   Ectopic  0   Multiple  0   Live Births  2        Obstetric Comments  No rhogam with SAB in Nov 2016        Social History Social History   Socioeconomic History   Marital status: Married    Spouse name: Not on file   Number of children: Not on file   Years of education: Not on file   Highest education level: Not on file  Occupational History   Not on file  Tobacco Use   Smoking status: Never   Smokeless tobacco: Never  Vaping Use   Vaping Use: Former  Substance and Sexual Activity   Alcohol use: Not Currently    Alcohol/week: 1.0 standard drink of  alcohol    Types: 1 Standard drinks or equivalent per week    Comment: monthly   Drug use: No   Sexual activity: Yes    Partners: Male    Birth control/protection: None  Other Topics Concern   Not on file  Social History Narrative   Not on file   Social Determinants of Health   Financial Resource Strain: Low Risk  (08/18/2022)   Overall Financial Resource Strain (CARDIA)    Difficulty of Paying Living Expenses: Not very hard  Food Insecurity: No Food Insecurity (10/22/2022)   Hunger Vital Sign    Worried About Running Out of Food in the Last Year: Never true    Ran Out of Food in the Last Year: Never true  Transportation Needs: No Transportation Needs (10/22/2022)   PRAPARE - 13/11/2022 (Medical): No    Lack of Transportation (Non-Medical): No  Physical Activity: Sufficiently Active (08/18/2022)   Exercise Vital Sign    Days of Exercise per Week: 3 days    Minutes of Exercise per Session: 60 min  Stress: Stress Concern Present (08/18/2022)   10/18/2022 of  Occupational Health - Occupational Stress Questionnaire    Feeling of Stress : To some extent  Social Connections: Moderately Isolated (08/18/2022)   Social Connection and Isolation Panel [NHANES]    Frequency of Communication with Friends and Family: Three times a week    Frequency of Social Gatherings with Friends and Family: Twice a week    Attends Religious Services: Never    Marine scientist or Organizations: No    Attends Music therapist: Never    Marital Status: Married   Family History: Family History  Problem Relation Age of Onset   Arthritis Mother    Hypertension Mother    COPD Father    Arthritis Father    Heart failure Father    Cancer Maternal Grandmother    Diabetes Maternal Grandmother    Allergies: Allergies  Allergen Reactions   Alpha-Gal Nausea And Vomiting   Food Other (See Comments)    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   Reaction:   Seizures    Prednisone Hives   Wound Dressing Adhesive Rash   Medications Prior to Admission  Medication Sig Dispense Refill Last Dose   aspirin EC 81 MG tablet Take 2 tablets (162 mg total) by mouth daily. Swallow whole. 180 tablet 2    Blood Pressure Monitor MISC For regular home bp monitoring during pregnancy 1 each 0    labetalol (NORMODYNE) 200 MG tablet Take 1 tablet (200 mg total) by mouth every 8 (eight) hours. 90 tablet 2    omeprazole (PRILOSEC) 20 MG capsule TAKE 1 CAPSULE (20 MG TOTAL) BY MOUTH DAILY. 90 capsule 2    ondansetron (ZOFRAN) 4 MG tablet Take 1 tablet (4 mg total) by mouth every 8 (eight) hours as needed for nausea or vomiting. (Patient not taking: Reported on 10/06/2022) 10 tablet 0    Prenatal Vit-Fe Fumarate-FA (PRENATAL VITAMIN PO) Take 1 tablet by mouth daily.      Probiotic CHEW Chew 1 tablet by mouth daily.      Review of Systems   All systems reviewed and negative except as stated in HPI  Blood pressure (!) 149/96, pulse 95, temperature 99.1 F (37.3 C), temperature source Oral, resp. rate 20, height 5\' 11"  (1.803 m), weight (!) 136.9 kg, last menstrual period 01/28/2022. General appearance: alert, cooperative, appears stated age, and no distress Lungs: clear to auscultation bilaterally Heart: regular rate and rhythm Abdomen: soft, non-tender; bowel sounds normal Pelvic: adequate Extremities: Homans sign is negative, no sign of DVT Presentation: cephalic Fetal monitoring Baseline: 150 bpm, Variability: moderate, Accelerations: present, and Decelerations: Absent Uterine activityFrequency: Every 10-15 minutes Dilation: 4 Effacement (%): 50 Station: -3 Exam by:: Mercado-Ortiz, MD  Prenatal labs: ABO, Rh: --/--/A NEG (11/12 1816) Antibody: NEG (11/12 1816) Rubella: 1.60 (05/22 1437) RPR: Non Reactive (09/08 0816)  HBsAg: Negative (05/22 1437)  HIV: Non Reactive (09/08 0816)  GBS: Positive/-- (10/27 1330)  1 hr Glucola: neg Genetic screening:  neg Anatomy US: mild stable ventriculomegaly (1.2cm on the right, 1.5cm on the left)   Prenatal Transfer Tool  Maternal Diabetes: No Genetic Screening: Normal Maternal Ultrasounds/Referrals: Other: Fetal Ultrasounds or other Referrals:  None Maternal Substance Abuse:  No Significant Maternal Medications:  None Significant Maternal Lab Results:  Group B Strep positive Number of Prenatal Visits:greater than 3 verified prenatal visits Other Comments:  None  Results for orders placed or performed during the hospital encounter of 10/22/22 (from the past 24 hour(s))  Type and screen  Collection Time: 10/22/22  6:16 PM  Result Value Ref Range   ABO/RH(D) A NEG    Antibody Screen NEG    Sample Expiration      10/25/2022,2359 Performed at [facility], [place] [address], [place], [place] [address]   CBC   Collection Time: 10/22/22  6:29 PM  Result Value Ref Range   WBC 9.4 4.0 - 10.5 K/uL   RBC 4.11 3.87 - 5.11 MIL/uL   Hemoglobin 11.6 (L) 12.0 - 15.0 g/dL   HCT 34.3 (L) 36.0 - 46.0 %   MCV 83.5 80.0 - 100.0 fL   MCH 28.2 26.0 - 34.0 pg   MCHC 33.8 30.0 - 36.0 g/dL   RDW 13.4 11.5 - 15.5 %   Platelets 387 150 - 400 K/uL   nRBC 0.0 0.0 - 0.2 %   Patient Active Problem List   Diagnosis Date Noted   Pregnancy complicated by fetal cerebral ventriculomegaly 09/29/2022   Chronic hypertension affecting pregnancy 09/29/2022   History of seizures 05/01/2022   Supervision of high-risk pregnancy 04/28/2022   Rh negative state in antepartum period 04/28/2022   Essential hypertension 04/12/2017   Hx of severe preeclampsia, prior pregnancy, currently pregnant 06/26/2016   Assessment/Plan:  Barbara Robertson is a 31 y.o. [id number] at [redacted]w[redacted]d here for [place].   #IOL: SVE 3/50/-3. Pt is a multip so will start with Pitocin. AROM when able.  #Pain: Maternal support #FWB: Cat I #ID:  GBS pos: plan for PCN (given [place])  #MOF: Breast and Bottle #MOC: Nexplanon if SVD/ BTL if pLTCS #Circ:  N/a  female  Rebecca Eaton, MD  PGY3 Family Medicine Resident 10/22/2022, 7:43 PM _____ GME ATTESTATION:  Evaluation and management procedures were performed by the [facility] Medicine Resident under my supervision. I was immediately available for direct supervision, assistance and direction throughout this encounter.  I also confirm that I have verified the information documented in the resident's note, and that I have also personally reperformed the pertinent components of the physical exam and all of the medical decision making activities.  I have also made any necessary editorial changes.  Shelda Pal, DO OB Fellow, [place] for [place] 10/22/2022 8:05 PM

## 2022-10-23 ENCOUNTER — Other Ambulatory Visit: Payer: Self-pay

## 2022-10-23 ENCOUNTER — Encounter (HOSPITAL_COMMUNITY): Payer: Self-pay | Admitting: Obstetrics and Gynecology

## 2022-10-23 DIAGNOSIS — O3509X1 Maternal care for (suspected) other central nervous system malformation or damage in fetus, fetus 1: Secondary | ICD-10-CM | POA: Diagnosis not present

## 2022-10-23 DIAGNOSIS — Z3A38 38 weeks gestation of pregnancy: Secondary | ICD-10-CM

## 2022-10-23 DIAGNOSIS — O1002 Pre-existing essential hypertension complicating childbirth: Secondary | ICD-10-CM | POA: Diagnosis not present

## 2022-10-23 DIAGNOSIS — O99824 Streptococcus B carrier state complicating childbirth: Secondary | ICD-10-CM

## 2022-10-23 LAB — RPR: RPR Ser Ql: NONREACTIVE

## 2022-10-23 MED ORDER — SODIUM CHLORIDE 0.9 % IV SOLN: INTRAVENOUS | Status: DC | PRN

## 2022-10-23 MED ORDER — OXYCODONE HCL 5 MG PO TABS: 10.0000 mg | ORAL_TABLET | ORAL | Status: DC | PRN

## 2022-10-23 MED ORDER — DIBUCAINE (PERIANAL) 1 % EX OINT: 1.0000 | TOPICAL_OINTMENT | CUTANEOUS | Status: DC | PRN

## 2022-10-23 MED ORDER — DIPHENHYDRAMINE HCL 50 MG/ML IJ SOLN: 12.5000 mg | INTRAMUSCULAR | Status: DC | PRN

## 2022-10-23 MED ORDER — RHO D IMMUNE GLOBULIN 1500 UNIT/2ML IJ SOSY
300.0000 ug | PREFILLED_SYRINGE | Freq: Once | INTRAMUSCULAR | Status: AC
  Administered 2022-10-23: 300 ug via INTRAVENOUS
  Filled 2022-10-23: qty 2

## 2022-10-23 MED ORDER — DIPHENHYDRAMINE HCL 25 MG PO CAPS: 25.0000 mg | ORAL_CAPSULE | Freq: Four times a day (QID) | ORAL | Status: DC | PRN

## 2022-10-23 MED ORDER — NIFEDIPINE ER OSMOTIC RELEASE 30 MG PO TB24
30.0000 mg | ORAL_TABLET | Freq: Every day | ORAL | Status: DC
  Administered 2022-10-23 – 2022-10-24 (×2): 30 mg via ORAL
  Filled 2022-10-23 (×2): qty 1

## 2022-10-23 MED ORDER — FUROSEMIDE 20 MG PO TABS
20.0000 mg | ORAL_TABLET | Freq: Every day | ORAL | Status: DC
  Administered 2022-10-23 – 2022-10-24 (×2): 20 mg via ORAL
  Filled 2022-10-23 (×2): qty 1

## 2022-10-23 MED ORDER — BENZOCAINE-MENTHOL 20-0.5 % EX AERO
1.0000 | INHALATION_SPRAY | CUTANEOUS | Status: DC | PRN
  Administered 2022-10-23: 1 via TOPICAL
  Filled 2022-10-23: qty 56

## 2022-10-23 MED ORDER — WITCH HAZEL-GLYCERIN EX PADS: 1.0000 | MEDICATED_PAD | CUTANEOUS | Status: DC | PRN

## 2022-10-23 MED ORDER — SIMETHICONE 80 MG PO CHEW: 80.0000 mg | CHEWABLE_TABLET | ORAL | Status: DC | PRN

## 2022-10-23 MED ORDER — SENNOSIDES-DOCUSATE SODIUM 8.6-50 MG PO TABS
2.0000 | ORAL_TABLET | ORAL | Status: DC
  Administered 2022-10-23: 2 via ORAL
  Filled 2022-10-23 (×2): qty 2

## 2022-10-23 MED ORDER — PHENYLEPHRINE 80 MCG/ML (10ML) SYRINGE FOR IV PUSH (FOR BLOOD PRESSURE SUPPORT): 80.0000 ug | PREFILLED_SYRINGE | INTRAVENOUS | Status: DC | PRN

## 2022-10-23 MED ORDER — IBUPROFEN 600 MG PO TABS
600.0000 mg | ORAL_TABLET | Freq: Four times a day (QID) | ORAL | Status: DC
  Administered 2022-10-23 – 2022-10-24 (×6): 600 mg via ORAL
  Filled 2022-10-23 (×6): qty 1

## 2022-10-23 MED ORDER — LACTATED RINGERS IV SOLN
500.0000 mL | Freq: Once | INTRAVENOUS | Status: AC
  Administered 2022-10-23: 500 mL via INTRAVENOUS

## 2022-10-23 MED ORDER — SODIUM CHLORIDE 0.9% FLUSH
3.0000 mL | Freq: Two times a day (BID) | INTRAVENOUS | Status: DC
  Administered 2022-10-23 – 2022-10-24 (×2): 3 mL via INTRAVENOUS

## 2022-10-23 MED ORDER — FENTANYL-BUPIVACAINE-NACL 0.5-0.125-0.9 MG/250ML-% EP SOLN: 12.0000 mL/h | EPIDURAL | Status: DC | PRN

## 2022-10-23 MED ORDER — OXYCODONE HCL 5 MG PO TABS: 5.0000 mg | ORAL_TABLET | ORAL | Status: DC | PRN

## 2022-10-23 MED ORDER — ONDANSETRON HCL 4 MG PO TABS: 4.0000 mg | ORAL_TABLET | ORAL | Status: DC | PRN

## 2022-10-23 MED ORDER — PRENATAL MULTIVITAMIN CH
1.0000 | ORAL_TABLET | Freq: Every day | ORAL | Status: DC
  Administered 2022-10-23 – 2022-10-24 (×2): 1 via ORAL
  Filled 2022-10-23 (×2): qty 1

## 2022-10-23 MED ORDER — ZOLPIDEM TARTRATE 5 MG PO TABS: 5.0000 mg | ORAL_TABLET | Freq: Every evening | ORAL | Status: DC | PRN

## 2022-10-23 MED ORDER — EPHEDRINE 5 MG/ML INJ: 10.0000 mg | INTRAVENOUS | Status: DC | PRN

## 2022-10-23 MED ORDER — SODIUM CHLORIDE 0.9% FLUSH: 3.0000 mL | INTRAVENOUS | Status: DC | PRN

## 2022-10-23 MED ORDER — ONDANSETRON HCL 4 MG/2ML IJ SOLN: 4.0000 mg | INTRAMUSCULAR | Status: DC | PRN

## 2022-10-23 MED ORDER — ACETAMINOPHEN 325 MG PO TABS: 650.0000 mg | ORAL_TABLET | ORAL | Status: DC | PRN

## 2022-10-23 MED ORDER — COCONUT OIL OIL: 1.0000 | TOPICAL_OIL | Status: DC | PRN

## 2022-10-23 NOTE — Progress Notes (Signed)
Per MD call for blood pressure greater than 160/ 110

## 2022-10-23 NOTE — Progress Notes (Signed)
Labor Progress Note Barbara Robertson is a 31 y.o. [id number] at [redacted]w[redacted]d presented for IOL for cHTN S: she is doing well. Feeling some cramping. Is on pitocin at 14units.  O:  BP 119/76   Pulse (!) 106   Temp (!) 97.5 F (36.4 C) (Oral)   Resp 18   Ht 5\' 11"  (1.803 m)   Wt (!) 136.9 kg   LMP 01/28/2022   BMI 42.09 kg/m   EFM: 135bpm/moderate variability /+accels, no decels Contractions; every 3-4 mins on toco.  CVE: Dilation: 6 Effacement (%): 70 Station: -2 Presentation: Vertex Exam by:: Dr. [email or web address]   A&P: 31 y.o. 38 [redacted]w[redacted]d IOL for cHTN.  #Labor: Progressing well. Now in active phase. AROM performed with clear fluid. Continue pitocin - titrate per protocol #Pain: maternal support. Epidural if desires #FWB: Category 1 #GBS positive - PCN cHTN: blood pressures have been stable. Some elevated, none in severe range.  [redacted]w[redacted]d MD MPH OB Fellow, Faculty Practice [facility], Center for [facility] 10/23/2022

## 2022-10-23 NOTE — Discharge Summary (Signed)
Postpartum Discharge Summary     Patient Name: Barbara Robertson DOB: 06/25/1991 MRN: [id number]  Date of admission: 10/22/2022 Delivery date:10/23/2022  Delivering provider: Stormy Card  Date of discharge: 10/24/2022  Admitting diagnosis: Chronic hypertension affecting pregnancy [O10.919] Intrauterine pregnancy: [redacted]w[redacted]d    Secondary diagnosis:  Principal Problem:   Vaginal delivery Active Problems:   Hx of severe preeclampsia, prior pregnancy, currently pregnant   Supervision of high-risk pregnancy   Rh negative state in antepartum period   Pregnancy complicated by fetal cerebral ventriculomegaly   Chronic hypertension affecting pregnancy  Additional problems: n/a    Discharge diagnosis: Term Pregnancy Delivered and CHTN                                              Post partum procedures:rhogam Augmentation: AROM and Pitocin Complications: None  Hospital course: Induction of Labor With Vaginal Delivery   31y.o. yo [id number] admitted to the hospital 10/22/2022 for induction of labor.  Indication for induction:  chronic hypertension .  Patient had an uncomplicated labor course. Membrane Rupture Time/Date: 1:40 AM ,10/23/2022   Delivery Method:Vaginal, Spontaneous  Episiotomy: None  Lacerations:   None, perineal abrasions noted. Details of delivery can be found in separate delivery note.  Patient had a postpartum course complicated by elevated BP readings which are being treated with procardia and lasix; asymptomatic. Patient is discharged home 10/24/22.  Newborn Data: Birth date:10/23/2022  Birth time:2:55 AM  Gender:Female  Living status:Living  Apgars:9 ,9  Weight:3884 g   Magnesium Sulfate received: No BMZ received: No Rhophylac:Yes MMR:N/A, Rubella Immune T-DaP:Given prenatally Flu: No, received prenatally  Transfusion:No  Physical exam  Vitals:   10/23/22 1500 10/23/22 1830 10/23/22 2100 10/24/22 0527  BP: (!) 134/94 (!) 144/107 129/78  129/70  Pulse: (!) 103 (!) 110 90 89  Resp: _0 Temp: 98.1 F (36.7 C) 98.5 F (36.9 C) 97.7 F (36.5 C) 98.1 F (36.7 C)  TempSrc: Oral Oral Oral Oral  SpO2: 100% 98% 99% 97%  Weight:      Height:       General: alert, cooperative, and no distress Lochia: appropriate Uterine Fundus: firm Incision: N/A DVT Evaluation: No evidence of DVT seen on physical exam. Labs: Lab Results  Component Value Date   WBC 9.4 10/22/2022   HGB 11.6 (L) 10/22/2022   HCT 34.3 (L) 10/22/2022   MCV 83.5 10/22/2022   PLT 387 10/22/2022      Latest Ref Rng & Units 10/09/2022   11:12 AM  CMP  Glucose 70 - 99 mg/dL 87   BUN 6 - 20 mg/dL 5   Creatinine 0.44 - 1.00 mg/dL 0.59   Sodium 135 - 145 mmol/L 136   Potassium 3.5 - 5.1 mmol/L 3.7   Chloride 98 - 111 mmol/L 106   CO2 22 - 32 mmol/L 21   Calcium 8.9 - 10.3 mg/dL 8.8   Total Protein 6.5 - 8.1 g/dL 6.6   Total Bilirubin 0.3 - 1.2 mg/dL 0.4   Alkaline Phos 38 - 126 U/L 136   AST 15 - 41 U/L 21   ALT 0 - 44 U/L 19    Edinburgh Score:    10/23/2022    6:34 AM  Edinburgh Postnatal Depression Scale Screening Tool  I have been able to laugh and  see the funny side of things. 0  I have looked forward with enjoyment to things. 0  I have blamed myself unnecessarily when things went wrong. 1  I have been anxious or worried for no good reason. 0  I have felt scared or panicky for no good reason. 0  Things have been getting on top of me. 1  I have been so unhappy that I have had difficulty sleeping. 0  I have felt sad or miserable. 0  I have been so unhappy that I have been crying. 0  The thought of harming myself has occurred to me. 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   Reaction:  Seizures    Prednisone Hives   Wound Dressing Adhesive Rash         Medication List     STOP taking these medications    aspirin EC 81 MG tablet   labetalol 200 MG tablet Commonly known as: NORMODYNE   ondansetron 4 MG tablet Commonly known as: Zofran       TAKE these medications    acetaminophen 325 MG tablet Commonly known as: Tylenol Take 2 tablets (650 mg total) by mouth every 4 (four) hours as needed (for pain scale < 4).   Blood Pressure Monitor Misc For regular home bp monitoring during pregnancy   furosemide 20 MG tablet Commonly known as: LASIX Take 1 tablet (20 mg total) by mouth daily for 3 days. Start taking on: October 25, 2022   ibuprofen 600 MG tablet Commonly known as: ADVIL Take 1 tablet (600 mg total) by mouth every 6 (six) hours.   NIFEdipine 30 MG 24 hr tablet Commonly known as: ADALAT CC Take 1 tablet (30 mg total) by mouth daily. Start taking on: October 25, 2022   omeprazole 20 MG capsule Commonly known as: PRILOSEC TAKE 1 CAPSULE (20 MG TOTAL) BY MOUTH DAILY.   oxyCODONE 5 MG immediate release tablet Commonly known as: Oxy IR/ROXICODONE Take 1 tablet (5 mg total) by mouth every 4 (four) hours as needed (pain scale 4-7).   PRENATAL VITAMIN PO Take 1 tablet by mouth daily.   Probiotic Chew Chew 1 tablet by mouth daily.         Discharge home in stable condition Infant Feeding: Bottle and Breast Infant Disposition:home with mother Discharge instruction: per After Visit Summary and Postpartum booklet. Activity: Advance as tolerated. Pelvic rest for 6 weeks.  Diet: routine diet Future Appointments: Future Appointments  Date Time Provider [place]  10/30/2022 10:50 AM [facility] NURSE [facility]  11/27/2022  1:30 PM Roma Schanz, CNM [facility]   Follow up Visit:  The following message was sent to [facility] by Mikki Santee, MD Please schedule this patient for a In person postpartum visit in 6 weeks with the following provider: Any provider. Additional  Postpartum F/U:BP check 1 week  High risk pregnancy complicated by: HTN, history of seizures, history of pre-eclampsia. Delivery mode:  Vaginal, Spontaneous  Anticipated Birth Control:  plan is for nexplanon prior to discharge   10/24/2022 [place], DO

## 2022-10-23 NOTE — Plan of Care (Signed)
  Problem: Education: Goal: Knowledge of General Education information will improve Description: Including pain rating scale, medication(s)/side effects and non-pharmacologic comfort measures Outcome: Completed/Met   Problem: Health Behavior/Discharge Planning: Goal: Ability to manage health-related needs will improve Outcome: Completed/Met   Problem: Clinical Measurements: Goal: Ability to maintain clinical measurements within normal limits will improve Outcome: Completed/Met Goal: Will remain free from infection Outcome: Completed/Met Goal: Diagnostic test results will improve Outcome: Completed/Met Goal: Respiratory complications will improve Outcome: Completed/Met Goal: Cardiovascular complication will be avoided Outcome: Completed/Met   Problem: Activity: Goal: Risk for activity intolerance will decrease Outcome: Completed/Met   Problem: Nutrition: Goal: Adequate nutrition will be maintained Outcome: Completed/Met   Problem: Coping: Goal: Level of anxiety will decrease Outcome: Completed/Met   Problem: Elimination: Goal: Will not experience complications related to bowel motility Outcome: Completed/Met Goal: Will not experience complications related to urinary retention Outcome: Completed/Met   Problem: Pain Managment: Goal: General experience of comfort will improve Outcome: Completed/Met   Problem: Safety: Goal: Ability to remain free from injury will improve Outcome: Completed/Met   Problem: Skin Integrity: Goal: Risk for impaired skin integrity will decrease Outcome: Completed/Met   Problem: Coping: Goal: Ability to verbalize concerns and feelings about labor and delivery will improve Outcome: Completed/Met   Problem: Life Cycle: Goal: Ability to make normal progression through stages of labor will improve Outcome: Completed/Met Goal: Ability to effectively push during vaginal delivery will improve Outcome: Completed/Met   Problem: Role  Relationship: Goal: Will demonstrate positive interactions with the child Outcome: Completed/Met   Problem: Safety: Goal: Risk of complications during labor and delivery will decrease Outcome: Completed/Met   Problem: Pain Management: Goal: Relief or control of pain from uterine contractions will improve Outcome: Completed/Met   Problem: Education: Goal: Knowledge of condition will improve Outcome: Completed/Met Goal: Individualized Educational Video(s) Outcome: Completed/Met Goal: Individualized Newborn Educational Video(s) Outcome: Completed/Met   Problem: Activity: Goal: Will verbalize the importance of balancing activity with adequate rest periods Outcome: Completed/Met Goal: Ability to tolerate increased activity will improve Outcome: Completed/Met   Problem: Coping: Goal: Ability to identify and utilize available resources and services will improve Outcome: Completed/Met   Problem: Life Cycle: Goal: Chance of risk for complications during the postpartum period will decrease Outcome: Completed/Met   Problem: Role Relationship: Goal: Ability to demonstrate positive interaction with newborn will improve Outcome: Completed/Met   Problem: Skin Integrity: Goal: Demonstration of wound healing without infection will improve Outcome: Completed/Met

## 2022-10-24 ENCOUNTER — Other Ambulatory Visit (HOSPITAL_COMMUNITY): Payer: Self-pay

## 2022-10-24 ENCOUNTER — Other Ambulatory Visit: Payer: Medicaid Other

## 2022-10-24 LAB — RH IG WORKUP (INCLUDES ABO/RH)
Fetal Screen: NEGATIVE
Gestational Age(Wks): 38
Unit division: 0

## 2022-10-24 LAB — BIRTH TISSUE RECOVERY COLLECTION (PLACENTA DONATION)

## 2022-10-24 MED ORDER — NIFEDIPINE ER 30 MG PO TB24
30.0000 mg | ORAL_TABLET | Freq: Every day | ORAL | 0 refills | Status: DC
  Filled 2022-10-24: qty 30, 30d supply, fill #0

## 2022-10-24 MED ORDER — FUROSEMIDE 20 MG PO TABS
20.0000 mg | ORAL_TABLET | Freq: Every day | ORAL | 0 refills | Status: DC
  Filled 2022-10-24: qty 3, 3d supply, fill #0

## 2022-10-24 MED ORDER — OXYCODONE HCL 5 MG PO TABS
5.0000 mg | ORAL_TABLET | ORAL | 0 refills | Status: DC | PRN
  Filled 2022-10-24: qty 30, 5d supply, fill #0

## 2022-10-24 MED ORDER — IBUPROFEN 600 MG PO TABS
600.0000 mg | ORAL_TABLET | Freq: Four times a day (QID) | ORAL | 0 refills | Status: DC
  Filled 2022-10-24: qty 30, 8d supply, fill #0

## 2022-10-24 MED ORDER — ACETAMINOPHEN 325 MG PO TABS
650.0000 mg | ORAL_TABLET | ORAL | 0 refills | Status: DC | PRN
  Filled 2022-10-24: qty 30, 3d supply, fill #0

## 2022-10-26 ENCOUNTER — Encounter: Payer: Self-pay | Admitting: *Deleted

## 2022-10-27 ENCOUNTER — Encounter: Payer: Medicaid Other | Admitting: Obstetrics & Gynecology

## 2022-10-27 ENCOUNTER — Other Ambulatory Visit: Payer: Medicaid Other

## 2022-10-30 ENCOUNTER — Ambulatory Visit (INDEPENDENT_AMBULATORY_CARE_PROVIDER_SITE_OTHER): Payer: Medicaid Other | Admitting: *Deleted

## 2022-10-30 VITALS — BP 136/91 | HR 119

## 2022-10-30 DIAGNOSIS — I10 Essential (primary) hypertension: Secondary | ICD-10-CM

## 2022-10-30 NOTE — Progress Notes (Signed)
   NURSE VISIT- BLOOD PRESSURE CHECK  SUBJECTIVE:  Barbara Robertson is a 31 y.o. [phone] female here for BP check. She is postpartum, delivery date 10/23/22     HYPERTENSION ROS:  Postpartum:  Severe headaches that don't go away with tylenol/other medicines: No  Visual changes (seeing spots/double/blurred vision) No  Severe pain under right breast breast or in center of upper chest No  Severe nausea/vomiting No  Taking medicines as instructed yes   OBJECTIVE:  Breastfeeding No   Appearance alert, well appearing, and in no distress and oriented to person, place, and time.  ASSESSMENT: Postpartum  blood pressure check  PLAN: Discussed with Myna Hidalgo, DO Recommendations: no changes needed   Follow-up: as scheduled Needs refill on Nifedipine   Shanzay Hepworth  10/30/2022 11:08 AM

## 2022-11-15 ENCOUNTER — Other Ambulatory Visit: Payer: Self-pay | Admitting: Obstetrics & Gynecology

## 2022-11-15 NOTE — Telephone Encounter (Signed)
Patient calling stating that she needs a refill on blood pressure medicine that they gave her to the hospital before she left. She is needing a refill on nifedipine 30 mg sent to [facility] pharmacy in [place] please

## 2022-11-16 MED ORDER — NIFEDIPINE ER 30 MG PO TB24: 30.0000 mg | ORAL_TABLET | Freq: Every day | ORAL | 0 refills | Status: DC

## 2022-11-16 NOTE — Addendum Note (Signed)
Addended by: Dereck Ligas on: 11/16/2022 10:44 AM   Modules accepted: Orders

## 2022-11-20 ENCOUNTER — Ambulatory Visit (INDEPENDENT_AMBULATORY_CARE_PROVIDER_SITE_OTHER): Payer: Medicaid Other | Admitting: Women's Health

## 2022-11-20 ENCOUNTER — Encounter: Payer: Self-pay | Admitting: Women's Health

## 2022-11-20 DIAGNOSIS — Z1330 Encounter for screening examination for mental health and behavioral disorders, unspecified: Secondary | ICD-10-CM

## 2022-11-20 DIAGNOSIS — I10 Essential (primary) hypertension: Secondary | ICD-10-CM | POA: Diagnosis not present

## 2022-11-20 NOTE — Progress Notes (Signed)
POSTPARTUM VISIT Patient name: Barbara Robertson MRN [id number]  Date of birth: 1991-01-04 Chief Complaint:   Postpartum Care  History of Present Illness:   Barbara Robertson is a 31 y.o. [phone] Caucasian female being seen today for a postpartum visit. She is 4 weeks postpartum following a spontaneous vaginal delivery at 38.2 gestational weeks. IOL: yes, for chronic hypertension . Anesthesia: none.  Laceration: none.  Complications: none. Inpatient contraception: no.   Pregnancy complicated by [facility] . Was on norvasc & lisinopril prior to pregnancy. Just took nifedipine on her way here (had to stop at pharmacy to get).  Tobacco use: no. Substance use disorder: no. Last pap smear: 01/24/22 and results were NILM w/ HRHPV negative. Next pap smear due: 2026 No LMP recorded.  Postpartum course has been uncomplicated. Bleeding none. Bowel function is normal. Bladder function is normal. Urinary incontinence? no, fecal incontinence? no Patient is not sexually active. Last sexual activity: prior to birth of baby. Desired contraception: Nexplanon. Patient does not want a pregnancy in the future.  Desired family size is 3 children.   The pregnancy intention screening data noted above was reviewed. Potential methods of contraception were discussed. The patient elected to proceed with No data recorded.  Edinburgh Postpartum Depression Screening: negative  Edinburgh Postnatal Depression Scale - 11/20/22 1001       Edinburgh Postnatal Depression Scale:  In the Past 7 Days   I have been able to laugh and see the funny side of things. 1    I have looked forward with enjoyment to things. 0    I have blamed myself unnecessarily when things went wrong. 0    I have been anxious or worried for no good reason. 0    I have felt scared or panicky for no good reason. 0    Things have been getting on top of me. 0    I have been so unhappy that I have had difficulty sleeping. 0    I have felt sad or miserable. 0    I  have been so unhappy that I have been crying. 0    The thought of harming myself has occurred to me. 0    Edinburgh Postnatal Depression Scale Total 1                08/18/2022   11:30 AM 05/01/2022    9:49 AM 01/24/2022    9:44 AM 11/25/2019    8:38 AM  GAD 7 : Generalized Anxiety Score  Nervous, Anxious, on Edge 1 0 1 2  Control/stop worrying _0 Worry too much - different things _1 Trouble relaxing _2 Restless 0 0 1 3  Easily annoyed or irritable 1 0 1 1  Afraid - awful might happen 0 0 1 0  Total GAD 7 Score _3 Anxiety Difficulty    Not difficult at all     Baby's course has been uncomplicated. Baby is feeding by breast and bottle: milk supply inadequate . Only gets about 1/2oz daily. Infant has a pediatrician/family doctor? Yes.  Childcare strategy if returning to work/school: n/a-stay at home mom.  Pt has material needs met for her and baby: Yes.   Review of Systems:   Pertinent items are noted in HPI Denies Abnormal vaginal discharge w/ itching/odor/irritation, headaches, visual changes, shortness of breath, chest pain, abdominal pain, severe nausea/vomiting, or problems with urination or bowel movements. Pertinent  History Reviewed:  Reviewed past medical,surgical, obstetrical and family history.  Reviewed problem list, medications and allergies. OB History  Gravida Para Term Preterm AB Living  _0 SAB IAB Ectopic Multiple Live Births  1 0 0 0 3    # Outcome Date GA Lbr Len/2nd Weight Sex Delivery Anes PTL Lv  4 Term 10/23/22 13w2d01:10 / 00:05 8 lb 9 oz (3.884 kg) F Vag-Spont None  LIV  3 Preterm 09/26/16 396w6d1:16 / 00:02 7 lb 10.8 oz (3.48 kg) F Vag-Spont None N LIV     Complications: Preeclampsia, Chronic hypertension  2 SAB 11/05/15          1 Term 01/30/14 3735w1d lb 3.7 oz (2.825 kg) F Vag-Spont EPI N LIV     Complications: Gestational hypertension    Obstetric Comments  No rhogam with SAB in Nov 2016   Physical  Assessment:   Vitals:   11/20/22 1000 11/20/22 1012 11/20/22 1051  BP: (!) 147/93 (!) 141/94 134/72  Pulse: 86 (!) 114   Weight: 271 lb (122.9 kg)    Height: _1  (1.803 m)    Body mass index is 37.8 kg/m.       Physical Examination:   General appearance: alert, well appearing, and in no distress  Mental status: alert, oriented to person, place, and time  Skin: warm & dry   Cardiovascular: normal heart rate noted   Respiratory: normal respiratory effort, no distress   Breasts: deferred, no complaints   Abdomen: soft, non-tender   Pelvic: examination not indicated. Thin prep pap obtained: No  Rectal: not examined  Extremities: Edema: none   Chaperone: N/A         No results found for this or any previous visit (from the past 24 hour(s)).  Assessment & Plan:  1) Postpartum exam 2) 4 wks s/p spontaneous vaginal delivery after IOL for CHTN 3) breast & bottle feeding 4) Depression screening 5) Contraception counseling> unable to void for UPT, will schedule Nexplanon, no sex until insertion 6) CHTN> just took nifedipine on her way here, bp rechecked at end of visit and better.  7) Decreased milk supply> gave printed prevention/relief measures   Essential components of care per ACOG recommendations:  1.  Mood and well being:  If positive depression screen, discussed and plan developed.  If using tobacco we discussed reduction/cessation and risk of relapse If current substance abuse, we discussed and referral to local resources was offered.   2. Infant care and feeding:  If breastfeeding, discussed returning to work, pumping, breastfeeding-associated pain, guidance regarding return to fertility while lactating if not using another method. If needed, patient was provided with a letter to be allowed to pump q 2-3hrs to support lactation in a private location with access to a refrigerator to store breastmilk.   Recommended that all caregivers be immunized for flu, pertussis and  other preventable communicable diseases If pt does not have material needs met for her/baby, referred to local resources for help obtaining these.  3. Sexuality, contraception and birth spacing Provided guidance regarding sexuality, management of dyspareunia, and resumption of intercourse Discussed avoiding interpregnancy interval <[id number] recommended birth spacing of 18 months  4. Sleep and fatigue Discussed coping options for fatigue and sleep disruption Encouraged family/partner/community support of 4 hrs of uninterrupted sleep to help with mood and fatigue  5. Physical recovery  If pt had a C/S, assessed incisional pain and providing guidance on normal  vs prolonged recovery If pt had a laceration, perineal healing and pain reviewed.  If urinary or fecal incontinence, discussed management and referred to PT or uro/gyn if indicated  Patient is safe to resume physical activity. Discussed attainment of healthy weight.  6.  Chronic disease management Discussed pregnancy complications if any, and their implications for future childbearing and long-term maternal health. Review recommendations for prevention of recurrent pregnancy complications, such as 17 hydroxyprogesterone caproate to reduce risk for recurrent PTB not applicable, or aspirin to reduce risk of preeclampsia not applicable. Pt had GDM: no. If yes, 2hr GTT scheduled: not applicable. Reviewed medications and non-pregnant dosing including consideration of whether pt is breastfeeding using a reliable resource such as LactMed: not applicable Referred for f/u w/ PCP or subspecialist providers as indicated: not applicable  7. Health maintenance Mammogram at 31yo or earlier if indicated Pap smears as indicated  Meds: No orders of the defined types were placed in this encounter.   Follow-up: Return for 1st available, Nexplanon insertion.   No orders of the defined types were placed in this encounter.   [place],  [facility] 11/20/2022 11:09 AM

## 2022-11-20 NOTE — Patient Instructions (Signed)
Tips To Increase Milk Supply Lots of water! Enough so that your urine is clear Plenty of calories, if you're not getting enough calories, your milk supply can decrease Breastfeed/pump often, every 2-3 hours x 20-30mins Fenugreek 3 pills 3 times a day, this may make your urine smell like maple syrup Mother's Milk Tea Lactation cookies, google for the recipe Real oatmeal Body Armor sports drinks Greater Than hydration drink  

## 2022-11-27 ENCOUNTER — Ambulatory Visit: Payer: Medicaid Other | Admitting: Women's Health

## 2022-12-12 ENCOUNTER — Other Ambulatory Visit: Payer: Self-pay | Admitting: Obstetrics & Gynecology

## 2022-12-14 ENCOUNTER — Encounter: Payer: Self-pay | Admitting: Women's Health

## 2022-12-19 ENCOUNTER — Encounter: Payer: Self-pay | Admitting: Women's Health

## 2022-12-19 ENCOUNTER — Ambulatory Visit (INDEPENDENT_AMBULATORY_CARE_PROVIDER_SITE_OTHER): Payer: Medicaid Other | Admitting: Women's Health

## 2022-12-19 VITALS — BP 128/80 | HR 109 | Ht 72.0 in | Wt 272.4 lb

## 2022-12-19 DIAGNOSIS — Z3202 Encounter for pregnancy test, result negative: Secondary | ICD-10-CM

## 2022-12-19 DIAGNOSIS — Z30017 Encounter for initial prescription of implantable subdermal contraceptive: Secondary | ICD-10-CM

## 2022-12-19 DIAGNOSIS — I10 Essential (primary) hypertension: Secondary | ICD-10-CM

## 2022-12-19 DIAGNOSIS — M533 Sacrococcygeal disorders, not elsewhere classified: Secondary | ICD-10-CM

## 2022-12-19 LAB — POCT URINE PREGNANCY: Preg Test, Ur: NEGATIVE

## 2022-12-19 MED ORDER — ETONOGESTREL 68 MG ~~LOC~~ IMPL
68.0000 mg | DRUG_IMPLANT | Freq: Once | SUBCUTANEOUS | Status: AC
  Administered 2022-12-19: 68 mg via SUBCUTANEOUS
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
## 2022-12-19 NOTE — Patient Instructions (Signed)
Keep the area clean and dry.  You can remove the big bandage in 24 hours, and the small steri-strip bandage in 3-5 days.  A back up method, such as condoms, should be used for two weeks. You may have irregular vaginal bleeding for the first 6 months after the Nexplanon is placed, then the bleeding usually lightens and it is possible that you may not have any periods.  If you have any concerns, please give us a call.    Etonogestrel Implant What is this medication? ETONOGESTREL (et oh noe JES trel) prevents ovulation and pregnancy. It belongs to a group of medications called contraceptives. This medication is a progestin hormone. This medicine may be used for other purposes; ask your health care provider or pharmacist if you have questions. COMMON BRAND NAME(S): Implanon, Nexplanon What should I tell my care team before I take this medication? They need to know if you have any of these conditions: Abnormal vaginal bleeding Blood clots Blood vessel disease Breast, cervical, endometrial, ovarian, liver, or uterine cancer Diabetes Gallbladder disease Heart disease or recent heart attack High blood pressure High cholesterol or triglycerides Kidney disease Liver disease Migraine headaches Seizures Stroke Tobacco use An unusual or allergic reaction to etonogestrel, other medications, foods, dyes, or preservatives Pregnant or trying to get pregnant Breastfeeding How should I use this medication? This device is inserted just under the skin on the inner side of your upper arm by your care team. Talk to your care team about the use of this medication in children. Special care may be needed. Overdosage: If you think you have taken too much of this medicine contact a poison control center or emergency room at once. NOTE: This medicine is only for you. Do not share this medicine with others. What if I miss a dose? This does not apply. What may interact with this medication? Do not take this  medication with any of the following: Amprenavir Fosamprenavir This medication may also interact with the following: Acitretin Aprepitant Armodafinil Bexarotene Bosentan Carbamazepine Certain antivirals for HIV or hepatitis Certain medications for fungal infections, such as fluconazole, ketoconazole, itraconazole, or voriconazole Cyclosporine Felbamate Griseofulvin Lamotrigine Modafinil Oxcarbazepine Phenobarbital Phenytoin Primidone Rifabutin Rifampin Rifapentine St. John's wort Topiramate This list may not describe all possible interactions. Give your health care provider a list of all the medicines, herbs, non-prescription drugs, or dietary supplements you use. Also tell them if you smoke, drink alcohol, or use illegal drugs. Some items may interact with your medicine. What should I watch for while using this medication? Visit your care team for regular checks on your progress. Using this medication does not protect you or your partner against HIV or other sexually transmitted infections (STIs). You should be able to feel the implant by pressing your fingertips over the skin where it was inserted. Contact your care team if you cannot feel the implant, and use a non-hormonal birth control method (such as condoms) until your care team confirms that the implant is in place. Contact your care team if you think that the implant may have broken or become bent while in your arm. You will receive a user card from your care team after the implant is inserted. The card is a record of the location of the implant in your upper arm and when it should be removed. Keep this card with your health records. What side effects may I notice from receiving this medication? Side effects that you should report to your care team as soon as   possible: Allergic reactions--skin rash, itching, hives, swelling of the face, lips, tongue, or throat Blood clot--pain, swelling, or warmth in the leg, shortness of  breath, chest pain Gallbladder problems--severe stomach pain, nausea, vomiting, fever Increase in blood pressure Liver injury--right upper belly pain, loss of appetite, nausea, light-colored stool, dark yellow or brown urine, yellowing skin or eyes, unusual weakness or fatigue New or worsening migraines or headaches Pain, redness, or irritation at injection site Stroke--sudden numbness or weakness of the face, arm, or leg, trouble speaking, confusion, trouble walking, loss of balance or coordination, dizziness, severe headache, change in vision Unusual vaginal discharge, itching, or odor Worsening mood, feelings of depression Side effects that usually do not require medical attention (report to your care team if they continue or are bothersome): Breast pain or tenderness Dark patches of skin on the face or other sun-exposed areas Irregular menstrual cycles or spotting Nausea Weight gain This list may not describe all possible side effects. Call your doctor for medical advice about side effects. You may report side effects to FDA at 1-800-FDA-1088. Where should I keep my medication? This medication is given in a hospital or clinic and will not be stored at home. NOTE: This sheet is a summary. It may not cover all possible information. If you have questions about this medicine, talk to your doctor, pharmacist, or health care provider.  2023 Elsevier/Gold Standard (2022-07-04 00:00:00)  

## 2023-01-16 ENCOUNTER — Ambulatory Visit (INDEPENDENT_AMBULATORY_CARE_PROVIDER_SITE_OTHER): Payer: Medicaid Other | Admitting: Adult Health

## 2023-01-16 ENCOUNTER — Encounter: Payer: Self-pay | Admitting: Adult Health

## 2023-01-16 VITALS — BP 165/107 | HR 114 | Ht 72.0 in | Wt 268.5 lb

## 2023-01-16 DIAGNOSIS — I10 Essential (primary) hypertension: Secondary | ICD-10-CM

## 2023-01-16 DIAGNOSIS — F53 Postpartum depression: Secondary | ICD-10-CM | POA: Diagnosis not present

## 2023-01-16 MED ORDER — HYDROCHLOROTHIAZIDE 12.5 MG PO CAPS: 12.5000 mg | ORAL_CAPSULE | Freq: Every day | ORAL | 3 refills | Status: DC

## 2023-01-16 MED ORDER — ESCITALOPRAM OXALATE 10 MG PO TABS: 10.0000 mg | ORAL_TABLET | Freq: Every day | ORAL | 2 refills | Status: DC

## 2023-01-16 NOTE — Progress Notes (Signed)
  Subjective:     Patient ID: Barbara Robertson, female   DOB: 1991/04/12, 32 y.o.   MRN: [id number]  HPI Barbara Robertson is a 32 year old white female, [id number], in complaining of depression, has 32 yo,32 yo and 1 month old and animals, to care for. Sleeps 4-5 hours per night and has decreased appetite, but would like to lose weight. Has had headache for las 3-4 days.  Last pap was 01/24/22 negative HPV and NILM  Review of Systems + depression +headache for 3-4 days Decreased appetite Reviewed past medical,surgical, social and family history. Reviewed medications and allergies.     Objective:   Physical Exam BP (!) 165/107 (BP Location: Left Arm, Patient Position: Sitting, Cuff Size: Normal)   Pulse (!) 114   Ht 6' (1.829 m)   Wt 268 lb 8 oz (121.8 kg)   LMP 01/15/2023   Breastfeeding No   BMI 36.42 kg/m     Skin warm and dry.  Lungs: clear to ausculation bilaterally. Cardiovascular: regular rate and rhythm.  Fall risk is low  Upstream - 01/16/23 1121       Pregnancy Intention Screening   Does the patient want to become pregnant in the next year? No    Does the patient's partner want to become pregnant in the next year? No    Would the patient like to discuss contraceptive options today? No      Contraception Wrap Up   Current Method Hormonal Implant    End Method Hormonal Implant            EPDS is 32 was 1 in December.  Assessment:     1. Postpartum depression Will rx lexapro 10 mg 1 daily  Follow up in about 5 weeks for ROS   2. Essential hypertension She is taking Nifedipine 30 mg daily Will add Microzide 12. 5 mg   Meds ordered this encounter  Medications   hydrochlorothiazide (MICROZIDE) 12.5 MG capsule    Sig: Take 1 capsule (12.5 mg total) by mouth daily.    Dispense:  30 capsule    Refill:  3    Order Specific Question:   Supervising Provider    Answer:   Elonda Husky, LUTHER H [[id number]]   escitalopram (LEXAPRO) 10 MG tablet    Sig: Take 1 tablet (10 mg total) by mouth  daily.    Dispense:  30 tablet    Refill:  2    Order Specific Question:   Supervising Provider    Answer:   Tania Ade H [[id number]]   Follow up for BP check in about 5 weeks Try [employer]     Plan:     Follow up in about 5 weeks

## 2023-02-07 ENCOUNTER — Other Ambulatory Visit: Payer: Self-pay | Admitting: Adult Health

## 2023-02-19 ENCOUNTER — Ambulatory Visit (INDEPENDENT_AMBULATORY_CARE_PROVIDER_SITE_OTHER): Payer: Medicaid Other | Admitting: Adult Health

## 2023-02-19 ENCOUNTER — Encounter: Payer: Self-pay | Admitting: Adult Health

## 2023-02-19 VITALS — BP 145/90 | HR 116 | Ht 72.0 in | Wt 268.0 lb

## 2023-02-19 DIAGNOSIS — F53 Postpartum depression: Secondary | ICD-10-CM | POA: Diagnosis not present

## 2023-02-19 DIAGNOSIS — I10 Essential (primary) hypertension: Secondary | ICD-10-CM | POA: Diagnosis not present

## 2023-02-19 MED ORDER — HYDROXYZINE PAMOATE 25 MG PO CAPS: ORAL_CAPSULE | ORAL | 0 refills | Status: DC

## 2023-02-19 MED ORDER — HYDROCHLOROTHIAZIDE 12.5 MG PO CAPS: 12.5000 mg | ORAL_CAPSULE | Freq: Every day | ORAL | 3 refills | Status: DC

## 2023-02-19 NOTE — Progress Notes (Signed)
Subjective:     Patient ID: Barbara Robertson, female   DOB: 1991/03/04, 32 y.o.   MRN: [id number]  HPI Barbara Robertson is a 32 year old white female,married, [id number] in for BP check and follow up on taking lexapro. She feels better, but not sleeping but about 5 hours but that is normal for her.     Component Value Date/Time   DIAGPAP  01/24/2022 1022    - Negative for intraepithelial lesion or malignancy (NILM)   DIAGPAP  12/25/2017 0000    NEGATIVE FOR INTRAEPITHELIAL LESIONS OR MALIGNANCY.   [place] Negative 01/24/2022 1022   ADEQPAP  01/24/2022 1022    Satisfactory for evaluation; transformation zone component PRESENT.   ADEQPAP  12/25/2017 0000    Satisfactory for evaluation  endocervical/transformation zone component PRESENT.    Review of Systems Feels better, still some days not great Sleeps about 5 hours Has decreased appetite but no weight loss  Reviewed past medical,surgical, social and family history. Reviewed medications and allergies.     Objective:   Physical Exam BP (!) 145/90 (BP Location: Right Arm, Patient Position: Sitting, Cuff Size: Large)   Pulse (!) 116   Ht 6' (1.829 m)   Wt 268 lb (121.6 kg)   Breastfeeding No   BMI 36.35 kg/m     Skin warm and dry.  Lungs: clear to ausculation bilaterally. Cardiovascular: regular rate and rhythm.     02/19/2023    9:56 AM 08/18/2022   11:30 AM 05/01/2022    9:48 AM  Depression screen PHQ 2/9  Decreased Interest '1 1 1  '$ Down, Depressed, Hopeless 1  0  PHQ - 2 Score '2 1 1  '$ Altered sleeping '3 2 1  '$ Tired, decreased energy '1 1 1  '$ Change in appetite '1 1 1  '$ Feeling bad or failure about yourself  0 0 0  Trouble concentrating 0 0 0  Moving slowly or fidgety/restless 2 0 0  Suicidal thoughts 0 0 0  PHQ-9 Score '9 5 4       '$ 02/19/2023   10:01 AM 08/18/2022   11:30 AM 05/01/2022    9:49 AM 01/24/2022    9:44 AM  GAD 7 : Generalized Anxiety Score  Nervous, Anxious, on Edge 0 1 0 1  Control/stop worrying '1 1 1 1  '$ Worry too much -  different things '2 1 1 1  '$ Trouble relaxing '2 1 1 1  '$ Restless 1 0 0 1  Easily annoyed or irritable 1 1 0 1  Afraid - awful might happen 0 0 0 1  Total GAD 7 Score '7 5 3 7      '$ Upstream - 02/19/23 0954       Pregnancy Intention Screening   Does the patient want to become pregnant in the next year? No    Does the patient's partner want to become pregnant in the next year? No    Would the patient like to discuss contraceptive options today? No      Contraception Wrap Up   Current Method Hormonal Implant    End Method Hormonal Implant    Contraception Counseling Provided No             Assessment:     1. Postpartum depression Feels better Will continue lexapro 10 mg 1 daily, has refills Will add vistaril 25 mg at HS to see if helps with sleep   2. Essential hypertension Continue nifedipine 30 mg daily, has refills Will refill microzide Meds ordered this encounter  Medications   hydrochlorothiazide (MICROZIDE) 12.5 MG capsule    Sig: Take 1 capsule (12.5 mg total) by mouth daily.    Dispense:  30 capsule    Refill:  3    Order Specific Question:   Supervising Provider    Answer:   Barbara Robertson, Barbara Robertson [[id number]]   hydrOXYzine (VISTARIL) 25 MG capsule    Sig: Take at bedtime    Dispense:  30 capsule    Refill:  0    Order Specific Question:   Supervising Provider    Answer:   Barbara Robertson [[id number]]       Plan:     Follow up in 3 months for ROS

## 2023-03-08 ENCOUNTER — Other Ambulatory Visit: Payer: Self-pay | Admitting: Adult Health

## 2023-03-08 ENCOUNTER — Encounter: Payer: Self-pay | Admitting: Women's Health

## 2023-03-08 MED ORDER — MEGESTROL ACETATE 40 MG PO TABS: ORAL_TABLET | ORAL | 0 refills | Status: DC

## 2023-03-08 NOTE — Progress Notes (Signed)
Rx megace??  

## 2023-03-14 ENCOUNTER — Other Ambulatory Visit: Payer: Self-pay | Admitting: Adult Health

## 2023-03-23 ENCOUNTER — Ambulatory Visit (INDEPENDENT_AMBULATORY_CARE_PROVIDER_SITE_OTHER): Payer: Medicaid Other | Admitting: Family Medicine

## 2023-03-23 ENCOUNTER — Encounter: Payer: Self-pay | Admitting: Family Medicine

## 2023-03-23 VITALS — BP 142/100 | HR 120 | Temp 98.1°F | Ht 72.0 in | Wt 271.0 lb

## 2023-03-23 DIAGNOSIS — F53 Postpartum depression: Secondary | ICD-10-CM

## 2023-03-23 DIAGNOSIS — I10 Essential (primary) hypertension: Secondary | ICD-10-CM

## 2023-03-23 MED ORDER — AMLODIPINE BESYLATE 5 MG PO TABS: 5.0000 mg | ORAL_TABLET | Freq: Every day | ORAL | 3 refills | Status: DC

## 2023-03-23 NOTE — Patient Instructions (Signed)
Stop Nifedipine.  Start Amlodipine.  Continue the HCTZ.  Follow up in 3 months. Keep an eye on BP @ home.

## 2023-03-24 DIAGNOSIS — F53 Postpartum depression: Secondary | ICD-10-CM | POA: Insufficient documentation

## 2023-03-24 NOTE — Progress Notes (Signed)
Subjective:  Patient ID: Barbara Robertson, female    DOB: November 08, 1991  Age: 32 y.o. MRN: [id number]  CC: Chief Complaint  Patient presents with   Establish Care    HPI:  32 year old female presents to establish care.  She is doing well at this time.  Patient feels like she can come off her Lexapro.  She would like to discuss this today.  Blood pressure elevated here today.  She states that her blood pressure is below 140/90 at home.  She is currently on nifedipine and HCTZ.  She has previously been on amlodipine as well as lisinopril.  Will discuss this today.  Patient Active Problem List   Diagnosis Date Noted   Postpartum depression 03/24/2023   Nexplanon insertion 12/19/2022   History of seizures 05/01/2022   Essential hypertension 04/12/2017   Hx of severe preeclampsia, prior pregnancy, currently pregnant 06/26/2016    Social Hx   Social History   Socioeconomic History   Marital status: Married    Spouse name: Not on file   Number of children: Not on file   Years of education: Not on file   Highest education level: Not on file  Occupational History   Not on file  Tobacco Use   Smoking status: Never   Smokeless tobacco: Never  Vaping Use   Vaping Use: Former  Substance and Sexual Activity   Alcohol use: Not Currently    Alcohol/week: 1.0 standard drink of alcohol    Types: 1 Standard drinks or equivalent per week    Comment: monthly   Drug use: No   Sexual activity: Not Currently    Partners: Male    Birth control/protection: Implant  Other Topics Concern   Not on file  Social History Narrative   Not on file   Social Determinants of Health   Financial Resource Strain: Low Risk  (08/18/2022)   Overall Financial Resource Strain (CARDIA)    Difficulty of Paying Living Expenses: Not very hard  Food Insecurity: No Food Insecurity (10/22/2022)   Hunger Vital Sign    Worried About Running Out of Food in the Last Year: Never true    Ran Out of Food in the Last  Year: Never true  Transportation Needs: No Transportation Needs (10/22/2022)   PRAPARE - Administrator, Civil Service (Medical): No    Lack of Transportation (Non-Medical): No  Physical Activity: Sufficiently Active (08/18/2022)   Exercise Vital Sign    Days of Exercise per Week: 3 days    Minutes of Exercise per Session: 60 min  Stress: Stress Concern Present (08/18/2022)   [employer] of Occupational Health - Occupational Stress Questionnaire    Feeling of Stress : To some extent  Social Connections: Moderately Isolated (08/18/2022)   Social Connection and Isolation Panel [NHANES]    Frequency of Communication with Friends and Family: Three times a week    Frequency of Social Gatherings with Friends and Family: Twice a week    Attends Religious Services: Never    Diplomatic Services operational officer: No    Attends Engineer, structural: Never    Marital Status: Married    Review of Systems Per HPI  Objective:  BP (!) 142/100   Pulse (!) 120   Temp 98.1 F (36.7 C)   Ht 6' (1.829 m)   Wt 271 lb (122.9 kg)   SpO2 97%   BMI 36.75 kg/m      03/23/2023  1:37 PM 02/19/2023   10:04 AM 02/19/2023    9:52 AM  BP/Weight  Systolic BP 142 145 136  Diastolic BP 100 90 91  Wt. (Lbs) 271  268  BMI 36.75 kg/m2  36.35 kg/m2    Physical Exam Vitals and nursing note reviewed.  Constitutional:      General: She is not in acute distress.    Appearance: Normal appearance.  HENT:     Head: Normocephalic and atraumatic.  Eyes:     General:        Right eye: No discharge.        Left eye: No discharge.     Conjunctiva/sclera: Conjunctivae normal.  Cardiovascular:     Rate and Rhythm: Regular rhythm. Tachycardia present.  Pulmonary:     Effort: Pulmonary effort is normal.     Breath sounds: Normal breath sounds. No wheezing, rhonchi or rales.  Neurological:     Mental Status: She is alert.  Psychiatric:        Mood and Affect: Mood normal.         Behavior: Behavior normal.     Lab Results  Component Value Date   WBC 9.4 10/22/2022   HGB 11.6 (L) 10/22/2022   HCT 34.3 (L) 10/22/2022   PLT 387 10/22/2022   GLUCOSE 87 10/09/2022   CHOL 148 04/12/2021   TRIG 164 (H) 04/12/2021   HDL 66 04/12/2021   LDLCALC 55 04/12/2021   ALT 19 10/09/2022   AST 21 10/09/2022   NA 136 10/09/2022   K 3.7 10/09/2022   CL 106 10/09/2022   CREATININE 0.59 10/09/2022   BUN 5 (L) 10/09/2022   CO2 21 (L) 10/09/2022   TSH 1.080 04/12/2021   HGBA1C 5.2 05/01/2022     Assessment & Plan:   Problem List Items Addressed This Visit       Cardiovascular and Mediastinum   Essential hypertension - Primary    Stopping nifedipine.  Starting amlodipine.  Continue HCTZ.      Relevant Medications   amLODipine (NORVASC) 5 MG tablet     Other   Postpartum depression    Advised patient that if she would like to come off the medication she needs to taper down.  I advised her to take a half a tablet for a week and then half a tablet every other day for a week.       Meds ordered this encounter  Medications   amLODipine (NORVASC) 5 MG tablet    Sig: Take 1 tablet (5 mg total) by mouth daily.    Dispense:  90 tablet    Refill:  3    Follow-up:  Return in about 3 months (around 06/22/2023) for HTN follow up.  Everlene Other DO [facility]

## 2023-03-24 NOTE — Assessment & Plan Note (Signed)
Advised patient that if she would like to come off the medication she needs to taper down.  I advised her to take a half a tablet for a week and then half a tablet every other day for a week.

## 2023-03-24 NOTE — Assessment & Plan Note (Signed)
Stopping nifedipine.  Starting amlodipine.  Continue HCTZ.

## 2023-05-22 ENCOUNTER — Encounter: Payer: Self-pay | Admitting: Adult Health

## 2023-05-22 ENCOUNTER — Ambulatory Visit (INDEPENDENT_AMBULATORY_CARE_PROVIDER_SITE_OTHER): Payer: Medicaid Other | Admitting: Adult Health

## 2023-05-22 VITALS — BP 168/98 | HR 138 | Ht 72.0 in | Wt 284.5 lb

## 2023-05-22 DIAGNOSIS — I10 Essential (primary) hypertension: Secondary | ICD-10-CM | POA: Diagnosis not present

## 2023-05-22 DIAGNOSIS — F53 Postpartum depression: Secondary | ICD-10-CM

## 2023-05-22 DIAGNOSIS — Z975 Presence of (intrauterine) contraceptive device: Secondary | ICD-10-CM | POA: Insufficient documentation

## 2023-05-22 MED ORDER — ESCITALOPRAM OXALATE 10 MG PO TABS: 10.0000 mg | ORAL_TABLET | Freq: Every day | ORAL | 3 refills | Status: DC

## 2023-05-22 MED ORDER — HYDROXYZINE PAMOATE 25 MG PO CAPS: ORAL_CAPSULE | ORAL | 6 refills | Status: DC

## 2023-05-22 MED ORDER — HYDROCHLOROTHIAZIDE 12.5 MG PO CAPS: 12.5000 mg | ORAL_CAPSULE | Freq: Every day | ORAL | 6 refills | Status: DC

## 2023-05-22 NOTE — Progress Notes (Signed)
Subjective:     Patient ID: Barbara Robertson, female   DOB: 12-Apr-1991, 32 y.o.   MRN: [id number]  HPI Barbara Robertson is a 32 year old white female,married, [id number], back in follow up on hypertension and postpartum depression. She tried weaning off lexapro and did stop, but resumed taking and is feeling good, has appetite back and sleeps some better with vistaril. She did stop nifedipine and is now taking norvasc 5 mg daily per Dr Adriana Simas,     Component Value Date/Time   DIAGPAP  01/24/2022 1022    - Negative for intraepithelial lesion or malignancy (NILM)   DIAGPAP  12/25/2017 0000    NEGATIVE FOR INTRAEPITHELIAL LESIONS OR MALIGNANCY.   HPVHIGH Negative 01/24/2022 1022   ADEQPAP  01/24/2022 1022    Satisfactory for evaluation; transformation zone component PRESENT.   ADEQPAP  12/25/2017 0000    Satisfactory for evaluation  endocervical/transformation zone component PRESENT.   PCP is Dr Adriana Simas.  Review of Systems Feels better Reviewed past medical,surgical, social and family history. Reviewed medications and allergies.     Objective:   Physical Exam BP (!) 168/98 (BP Location: Right Arm, Patient Position: Sitting, Cuff Size: Large)   Pulse (!) 138   Ht 6' (1.829 m)   Wt 284 lb 8 oz (129 kg)   LMP 05/15/2023 (Approximate)   Breastfeeding No   BMI 38.59 kg/m  Skin warm and dry. Lungs: clear to ausculation bilaterally. Cardiovascular: regular rate and rhythm.  Fall risk is moderate    05/22/2023   10:24 AM 03/23/2023    1:50 PM 02/19/2023    9:56 AM  Depression screen PHQ 2/9  Decreased Interest 0 0 1  Down, Depressed, Hopeless 0 0 1  PHQ - 2 Score 0 0 2  Altered sleeping 1 2 3   Tired, decreased energy 1 1 1   Change in appetite 0 0 1  Feeling bad or failure about yourself  0 0 0  Trouble concentrating 1 0 0  Moving slowly or fidgety/restless 0 0 2  Suicidal thoughts 0 0 0  PHQ-9 Score 3 3 9   Difficult doing work/chores Somewhat difficult Not difficult at all        05/22/2023    10:29 AM 03/23/2023    1:50 PM 02/19/2023   10:01 AM 08/18/2022   11:30 AM  GAD 7 : Generalized Anxiety Score  Nervous, Anxious, on Edge 0 0 0 1  Control/stop worrying 3 0 1 1  Worry too much - different things 0 0 2 1  Trouble relaxing 3 0 2 1  Restless 0 0 1 0  Easily annoyed or irritable 1 1 1 1   Afraid - awful might happen 0 0 0 0  Total GAD 7 Score 7 1 7 5   Anxiety Difficulty  Not difficult at all        Upstream - 05/22/23 1037       Pregnancy Intention Screening   Does the patient want to become pregnant in the next year? No    Does the patient's partner want to become pregnant in the next year? No    Would the patient like to discuss contraceptive options today? No      Contraception Wrap Up   Current Method Hormonal Implant    End Method Hormonal Implant                Assessment:     1. Postpartum depression Feels better Continue lexapro and vistaril, will refill   Meds  ordered this encounter  Medications   hydrochlorothiazide (MICROZIDE) 12.5 MG capsule    Sig: Take 1 capsule (12.5 mg total) by mouth daily.    Dispense:  30 capsule    Refill:  6    Order Specific Question:   Supervising Provider    Answer:   Despina Hidden, LUTHER H [[id number]]   escitalopram (LEXAPRO) 10 MG tablet    Sig: Take 1 tablet (10 mg total) by mouth daily.    Dispense:  90 tablet    Refill:  3    Order Specific Question:   Supervising Provider    Answer:   Duane Lope H [[id number]]   hydrOXYzine (VISTARIL) 25 MG capsule    Sig: Take 1 at bedtime    Dispense:  30 capsule    Refill:  6    Order Specific Question:   Supervising Provider    Answer:   Despina Hidden, LUTHER H [[id number]]     2. Essential hypertension Continue Norvasc 5 mg daily and Microzide 12.5 mg(will refill microzide Try to eat more veggies and fruits Walk every day about 60 minutes  Follow up with Dr Adriana Simas in July   3. Nexplanon in place    Placed 12/19/22  Plan:     Return in 6 months for physical

## 2023-06-29 ENCOUNTER — Ambulatory Visit (INDEPENDENT_AMBULATORY_CARE_PROVIDER_SITE_OTHER): Payer: Medicaid Other | Admitting: Family Medicine

## 2023-06-29 VITALS — BP 139/91 | HR 75 | Temp 98.4°F | Ht 72.0 in | Wt 284.0 lb

## 2023-06-29 DIAGNOSIS — I10 Essential (primary) hypertension: Secondary | ICD-10-CM

## 2023-06-29 DIAGNOSIS — F53 Postpartum depression: Secondary | ICD-10-CM

## 2023-06-29 MED ORDER — AMLODIPINE BESYLATE 10 MG PO TABS: 10.0000 mg | ORAL_TABLET | Freq: Every day | ORAL | 3 refills | Status: DC

## 2023-06-29 NOTE — Patient Instructions (Signed)
I increased the dose of Amlodipine.  Continue the hydrochlorothiazide.  Follow up in 3 months.

## 2023-06-30 NOTE — Progress Notes (Signed)
Subjective:  Patient ID: Barbara Robertson, female    DOB: [birth year]  Age: 32 y.o. MRN: [id number]  CC: Chief Complaint  Patient presents with   Hypertension    HPI:  32 year old female presents for follow-up regarding hypertension.  Patient states overall she is doing well.  BP mildly elevated here today.  She is on amlodipine 5 mg daily, and medical thiazide 12.5 mg daily.  Patient has had postpartum depression.  Overall doing well on Lexapro 10 mg daily.  Patient Active Problem List   Diagnosis Date Noted   Postpartum depression 03/24/2023   Nexplanon insertion 12/19/2022   History of seizures 05/01/2022   Essential hypertension 04/12/2017   Hx of severe preeclampsia, prior pregnancy, currently pregnant 06/26/2016    Social Hx   Social History   Socioeconomic History   Marital status: Married    Spouse name: Not on file   Number of children: Not on file   Years of education: Not on file   Highest education level: Not on file  Occupational History   Not on file  Tobacco Use   Smoking status: Never   Smokeless tobacco: Never  Vaping Use   Vaping status: Former  Substance and Sexual Activity   Alcohol use: Yes    Alcohol/week: 1.0 standard drink of alcohol    Types: 1 Standard drinks or equivalent per week    Comment: rarely   Drug use: No   Sexual activity: Yes    Partners: Male    Birth control/protection: Implant  Other Topics Concern   Not on file  Social History Narrative   Not on file   Social Determinants of Health   Financial Resource Strain: Low Risk  (08/18/2022)   Overall Financial Resource Strain (CARDIA)    Difficulty of Paying Living Expenses: Not very hard  Food Insecurity: No Food Insecurity (10/22/2022)   Hunger Vital Sign    Worried About Running Out of Food in the Last Year: Never true    Ran Out of Food in the Last Year: Never true  Transportation Needs: No Transportation Needs (10/22/2022)   PRAPARE - Scientist, research (physical sciences) (Medical): No    Lack of Transportation (Non-Medical): No  Physical Activity: Sufficiently Active (08/18/2022)   Exercise Vital Sign    Days of Exercise per Week: 3 days    Minutes of Exercise per Session: 60 min  Stress: Stress Concern Present (08/18/2022)   [employer] of Occupational Health - Occupational Stress Questionnaire    Feeling of Stress : To some extent  Social Connections: Moderately Isolated (08/18/2022)   Social Connection and Isolation Panel [NHANES]    Frequency of Communication with Friends and Family: Three times a week    Frequency of Social Gatherings with Friends and Family: Twice a week    Attends Religious Services: Never    Database administrator or Organizations: No    Attends Banker Meetings: Never    Marital Status: Married    Review of Systems  Constitutional: Negative.   Respiratory: Negative.    Cardiovascular: Negative.     Objective:  BP (!) 139/91   Pulse 75   Temp 98.4 F (36.9 C)   Ht 6' (1.829 m)   Wt 284 lb (128.8 kg)   SpO2 97%   BMI 38.52 kg/m      06/29/2023    9:20 AM 05/22/2023   10:40 AM 05/22/2023   10:16 AM  BP/Weight  Systolic  BP 139 168 146  Diastolic BP 91 98 87  Wt. (Lbs) 284  284.5  BMI 38.52 kg/m2  38.59 kg/m2    Physical Exam Vitals and nursing note reviewed.  Constitutional:      Appearance: Normal appearance. She is obese.  HENT:     Head: Normocephalic and atraumatic.  Cardiovascular:     Rate and Rhythm: Normal rate and regular rhythm.  Pulmonary:     Effort: Pulmonary effort is normal.     Breath sounds: Normal breath sounds.  Neurological:     Mental Status: She is alert.  Psychiatric:        Mood and Affect: Mood normal.        Behavior: Behavior normal.     Lab Results  Component Value Date   WBC 9.4 10/22/2022   HGB 11.6 (L) 10/22/2022   HCT 34.3 (L) 10/22/2022   PLT 387 10/22/2022   GLUCOSE 87 10/09/2022   CHOL 148 04/12/2021   TRIG 164 (H) 04/12/2021    HDL 66 04/12/2021   LDLCALC 55 04/12/2021   ALT 19 10/09/2022   AST 21 10/09/2022   NA 136 10/09/2022   K 3.7 10/09/2022   CL 106 10/09/2022   CREATININE 0.59 10/09/2022   BUN 5 (L) 10/09/2022   CO2 21 (L) 10/09/2022   TSH 1.080 04/12/2021   HGBA1C 5.2 05/01/2022     Assessment & Plan:   Problem List Items Addressed This Visit       Cardiovascular and Mediastinum   Essential hypertension - Primary    BP mildly elevated here today.  At goal.  Increasing amlodipine to 10 mg daily.  Continue HCTZ 12.5 mg daily.      Relevant Medications   amLODipine (NORVASC) 10 MG tablet     Other   Postpartum depression    Stable.  Continue Lexapro.       Meds ordered this encounter  Medications   amLODipine (NORVASC) 10 MG tablet    Sig: Take 1 tablet (10 mg total) by mouth daily.    Dispense:  90 tablet    Refill:  3    Follow-up:  Return in about 3 months (around 09/29/2023) for HTN follow up.  Everlene Other DO [facility]

## 2023-06-30 NOTE — Assessment & Plan Note (Signed)
Stable.  Continue Lexapro. 

## 2023-06-30 NOTE — Assessment & Plan Note (Signed)
BP mildly elevated here today.  At goal.  Increasing amlodipine to 10 mg daily.  Continue HCTZ 12.5 mg daily.

## 2023-08-27 ENCOUNTER — Telehealth: Payer: Self-pay | Admitting: Family Medicine

## 2023-08-27 DIAGNOSIS — L600 Ingrowing nail: Secondary | ICD-10-CM

## 2023-08-27 NOTE — Telephone Encounter (Signed)
Patient has ingrown toe nail on big toe on left foot requesting a referral or will she need to be seen first in office.

## 2023-08-28 ENCOUNTER — Encounter: Payer: Self-pay | Admitting: *Deleted

## 2023-08-28 NOTE — Addendum Note (Signed)
Addended by: Margaretha Sheffield on: 08/28/2023 04:19 PM   Modules accepted: Orders

## 2023-08-28 NOTE — Telephone Encounter (Signed)
Barbara Sams, DO  You2 hours ago (8:33 AM)    Molli Knock to place referral to podiatry.

## 2023-08-28 NOTE — Telephone Encounter (Signed)
Referral ordered in EPIC. Patient notified via my chart

## 2023-08-31 ENCOUNTER — Ambulatory Visit: Payer: Medicaid Other | Admitting: Podiatrist

## 2023-08-31 ENCOUNTER — Encounter: Payer: Self-pay | Admitting: Podiatrist

## 2023-08-31 DIAGNOSIS — L600 Ingrowing nail: Secondary | ICD-10-CM

## 2023-08-31 MED ORDER — MUPIROCIN 2 % EX OINT: 1.0000 | TOPICAL_OINTMENT | Freq: Two times a day (BID) | CUTANEOUS | 2 refills | Status: DC

## 2023-08-31 NOTE — Progress Notes (Signed)
Chief Complaint  Patient presents with   Toe Pain    Hallux left - medial border, tender x 1 week, tried trimming-no help   New Patient (Initial Visit)     HPI: Patient is 32 y.o. female who presents today for a painful ingrowing hallux nail in the medial side.  She states she has tried trimming out the corner however it has continued to be painful.  She presents today with her 2-month daughter.  Patient Active Problem List   Diagnosis Date Noted   Postpartum depression 03/24/2023   Nexplanon insertion 12/19/2022   History of seizures 05/01/2022   Essential hypertension 04/12/2017   Hx of severe preeclampsia, prior pregnancy, currently pregnant 06/26/2016    Current Outpatient Medications on File Prior to Visit  Medication Sig Dispense Refill   amLODipine (NORVASC) 10 MG tablet Take 1 tablet (10 mg total) by mouth daily. 90 tablet 3   Blood Pressure Monitor MISC For regular home bp monitoring during pregnancy 1 each 0   escitalopram (LEXAPRO) 10 MG tablet Take 1 tablet (10 mg total) by mouth daily. 90 tablet 3   etonogestrel (NEXPLANON) 68 MG IMPL implant 1 each by Subdermal route once.     hydrochlorothiazide (MICROZIDE) 12.5 MG capsule Take 1 capsule (12.5 mg total) by mouth daily. 30 capsule 6   hydrOXYzine (VISTARIL) 25 MG capsule Take 1 at bedtime 30 capsule 6   Multiple Vitamin (MULTIVITAMIN) tablet Take 1 tablet by mouth daily. gummy     No current facility-administered medications on file prior to visit.    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   Reaction:  Seizures    Prednisone Hives   Wound Dressing Adhesive Rash    Review of Systems No fevers, chills, nausea, muscle aches, no difficulty breathing, no calf pain, no chest pain or shortness of breath.   Physical Exam  GENERAL APPEARANCE: Alert, conversant. Appropriately groomed. No acute distress.   VASCULAR:  Pedal pulses palpable 2/4 DP and 2/4 PT bilateral.  Capillary refill time is immediate to all digits,  Proximal to distal cooling is warm to warm.  Digital perfusion adequate.   NEUROLOGIC: sensation is intact to 5.07 monofilament at 5/5 sites bilateral.  Light touch is intact bilateral, vibratory sensation intact bilateral  MUSCULOSKELETAL: acceptable muscle strength, tone and stability bilateral.  No gross boney pedal deformities noted.  No pain, crepitus or limitation noted with foot and ankle range of motion bilateral.   DERMATOLOGIC: skin is warm, supple, and dry.  Hallux nail medial nail border is inflamed and ingrown.  Pain with palpation and direct pressure along this nail border is noted.  No pus or purulence is noted or expressed.    Assessment   ingrown nail left first medial side. -    Plan  Treatment options and alternatives were discussed. Recommended a permanent removal of the medial nail border of the left patient agreed. Skin was prepped with alcohol and a local injection of lidocaine and Marcaine plain was infiltrated to anesthetize the toe. The toe was then prepped with Betadine and exsanguinated. The offending nail border was removed and sodium hydroxide applied to the exposed matrix tissue.  The area was then cleansed well with vinegar.  Antibiotic ointment and a dressing was then applied the tourniquet released  noting a prompt hyperemic response to the tip of the toe.  Oral and written  instructions were dispensed and the patient was instructed on aftercare.  If there is any increased redness, swelling, drainage, pus or any other concerns arise, Valma will call to be seen.

## 2023-08-31 NOTE — Patient Instructions (Signed)

## 2023-09-04 ENCOUNTER — Ambulatory Visit: Payer: Medicaid Other | Admitting: Family Medicine

## 2023-09-04 VITALS — BP 123/91 | HR 116 | Temp 98.6°F | Ht 72.0 in | Wt 283.2 lb

## 2023-09-04 DIAGNOSIS — M654 Radial styloid tenosynovitis [de Quervain]: Secondary | ICD-10-CM | POA: Diagnosis not present

## 2023-09-04 MED ORDER — MELOXICAM 15 MG PO TABS: 15.0000 mg | ORAL_TABLET | Freq: Every day | ORAL | 0 refills | Status: DC | PRN

## 2023-09-04 NOTE — Assessment & Plan Note (Signed)
Rx given for thumb spica splint.  Meloxicam as directed.  Supportive care.  If continues to persist will need to see orthopedics for injection.

## 2023-09-04 NOTE — Progress Notes (Signed)
Subjective:  Patient ID: Barbara Robertson, female    DOB: [birth year]  Age: 32 y.o. MRN: [id number]  CC: Right wrist pain   HPI:  32 year old female presents with right wrist pain.  Patient states that this has been going on for the past 2-1/2 to 3 months.  Located on the radial aspect.  Worse with activity.  She has been using a brace without resolution.  No trauma, fall, injury.  Patient Active Problem List   Diagnosis Date Noted   De Quervain's tenosynovitis, right 09/04/2023   Postpartum depression 03/24/2023   Nexplanon insertion 12/19/2022   History of seizures 05/01/2022   Essential hypertension 04/12/2017   Hx of severe preeclampsia, prior pregnancy, currently pregnant 06/26/2016    Social Hx   Social History   Socioeconomic History   Marital status: Married    Spouse name: Not on file   Number of children: Not on file   Years of education: Not on file   Highest education level: Not on file  Occupational History   Not on file  Tobacco Use   Smoking status: Never   Smokeless tobacco: Never  Vaping Use   Vaping status: Former  Substance and Sexual Activity   Alcohol use: Yes    Alcohol/week: 1.0 standard drink of alcohol    Types: 1 Standard drinks or equivalent per week    Comment: rarely   Drug use: No   Sexual activity: Yes    Partners: Male    Birth control/protection: Implant  Other Topics Concern   Not on file  Social History Narrative   Not on file   Social Determinants of Health   Financial Resource Strain: Low Risk  (08/18/2022)   Overall Financial Resource Strain (CARDIA)    Difficulty of Paying Living Expenses: Not very hard  Food Insecurity: No Food Insecurity (10/22/2022)   Hunger Vital Sign    Worried About Running Out of Food in the Last Year: Never true    Ran Out of Food in the Last Year: Never true  Transportation Needs: No Transportation Needs (10/22/2022)   PRAPARE - Administrator, Civil Service (Medical): No    Lack of  Transportation (Non-Medical): No  Physical Activity: Sufficiently Active (08/18/2022)   Exercise Vital Sign    Days of Exercise per Week: 3 days    Minutes of Exercise per Session: 60 min  Stress: Stress Concern Present (08/18/2022)   [employer] of Occupational Health - Occupational Stress Questionnaire    Feeling of Stress : To some extent  Social Connections: Moderately Isolated (08/18/2022)   Social Connection and Isolation Panel [NHANES]    Frequency of Communication with Friends and Family: Three times a week    Frequency of Social Gatherings with Friends and Family: Twice a week    Attends Religious Services: Never    Diplomatic Services operational officer: No    Attends Engineer, structural: Never    Marital Status: Married    Review of Systems Per HPI  Objective:  BP (!) 123/91   Pulse (!) 116   Temp 98.6 F (37 C) (Oral)   Ht 6' (1.829 m)   Wt 283 lb 3.2 oz (128.5 kg)   SpO2 96%   BMI 38.41 kg/m      09/04/2023    9:47 AM 06/29/2023    9:20 AM 05/22/2023   10:40 AM  BP/Weight  Systolic BP 123 139 168  Diastolic BP 91 91 98  Wt. (Lbs) 283.2 284   BMI 38.41 kg/m2 38.52 kg/m2     Physical Exam Constitutional:      General: She is not in acute distress.    Appearance: Normal appearance.  HENT:     Head: Normocephalic and atraumatic.  Pulmonary:     Effort: Pulmonary effort is normal. No respiratory distress.  Musculoskeletal:     Comments: Right wrist: Tenderness over the radial aspect of the wrist.  Positive Finkelstein test.  Neurological:     Mental Status: She is alert.     Lab Results  Component Value Date   WBC 9.4 10/22/2022   HGB 11.6 (L) 10/22/2022   HCT 34.3 (L) 10/22/2022   PLT 387 10/22/2022   GLUCOSE 87 10/09/2022   CHOL 148 04/12/2021   TRIG 164 (H) 04/12/2021   HDL 66 04/12/2021   LDLCALC 55 04/12/2021   ALT 19 10/09/2022   AST 21 10/09/2022   NA 136 10/09/2022   K 3.7 10/09/2022   CL 106 10/09/2022   CREATININE  0.59 10/09/2022   BUN 5 (L) 10/09/2022   CO2 21 (L) 10/09/2022   TSH 1.080 04/12/2021   HGBA1C 5.2 05/01/2022     Assessment & Plan:   Problem List Items Addressed This Visit       Musculoskeletal and Integument   De Quervain's tenosynovitis, right - Primary    Rx given for thumb spica splint.  Meloxicam as directed.  Supportive care.  If continues to persist will need to see orthopedics for injection.       Meds ordered this encounter  Medications   meloxicam (MOBIC) 15 MG tablet    Sig: Take 1 tablet (15 mg total) by mouth daily as needed for pain.    Dispense:  30 tablet    Refill:  0    Follow-up:  Return if symptoms worsen or fail to improve.  Everlene Other DO [facility]

## 2023-09-15 ENCOUNTER — Ambulatory Visit (INDEPENDENT_AMBULATORY_CARE_PROVIDER_SITE_OTHER): Payer: Medicaid Other | Admitting: *Deleted

## 2023-09-15 DIAGNOSIS — Z23 Encounter for immunization: Secondary | ICD-10-CM | POA: Diagnosis not present

## 2023-10-01 ENCOUNTER — Ambulatory Visit: Payer: Medicaid Other | Admitting: Family Medicine

## 2023-10-11 ENCOUNTER — Other Ambulatory Visit: Payer: Self-pay | Admitting: Family Medicine

## 2023-10-11 ENCOUNTER — Other Ambulatory Visit: Payer: Self-pay | Admitting: Adult Health

## 2023-10-11 ENCOUNTER — Ambulatory Visit (INDEPENDENT_AMBULATORY_CARE_PROVIDER_SITE_OTHER): Payer: Medicaid Other | Admitting: Family Medicine

## 2023-10-11 VITALS — BP 133/87 | HR 112 | Wt 284.0 lb

## 2023-10-11 DIAGNOSIS — D649 Anemia, unspecified: Secondary | ICD-10-CM

## 2023-10-11 DIAGNOSIS — E781 Pure hyperglyceridemia: Secondary | ICD-10-CM

## 2023-10-11 DIAGNOSIS — F53 Postpartum depression: Secondary | ICD-10-CM

## 2023-10-11 DIAGNOSIS — L989 Disorder of the skin and subcutaneous tissue, unspecified: Secondary | ICD-10-CM | POA: Diagnosis not present

## 2023-10-11 DIAGNOSIS — I10 Essential (primary) hypertension: Secondary | ICD-10-CM | POA: Diagnosis not present

## 2023-10-11 NOTE — Assessment & Plan Note (Signed)
Fairly well-controlled this time.  Advised patient to monitor her blood pressure regularly.  Advised that she can consider a trial off medication with close monitoring of her blood pressure.

## 2023-10-11 NOTE — Assessment & Plan Note (Signed)
Stable Lexapro.  Continue.

## 2023-10-11 NOTE — Progress Notes (Signed)
Subjective:  Patient ID: Barbara Robertson, female    DOB: 27-Jan-1991  Age: 32 y.o. MRN: [id number]  CC:  Follow up hypertension   HPI:  32 year old female presents for follow-up.  Blood pressure is fairly well-controlled.  She has not taken her blood pressure medication today.  She is on amlodipine 10 mg daily and hydrochlorothiazide 12.5 mg daily.  Patient reports that her mood is stable on Lexapro.  She does report recent stressors with moving but overall she is doing well.  Patient also reports that she has area of redness to her left arm.  It does not itch and is not painful.  She would like me to examine it today.  Patient Active Problem List   Diagnosis Date Noted   Skin lesion 10/11/2023   De Quervain's tenosynovitis, right 09/04/2023   Postpartum depression 03/24/2023   Nexplanon insertion 12/19/2022   History of seizures 05/01/2022   Essential hypertension 04/12/2017   Hx of severe preeclampsia, prior pregnancy, currently pregnant 06/26/2016    Social Hx   Social History   Socioeconomic History   Marital status: Married    Spouse name: Not on file   Number of children: Not on file   Years of education: Not on file   Highest education level: Not on file  Occupational History   Not on file  Tobacco Use   Smoking status: Never   Smokeless tobacco: Never  Vaping Use   Vaping status: Former  Substance and Sexual Activity   Alcohol use: Yes    Alcohol/week: 1.0 standard drink of alcohol    Types: 1 Standard drinks or equivalent per week    Comment: rarely   Drug use: No   Sexual activity: Yes    Partners: Male    Birth control/protection: Implant  Other Topics Concern   Not on file  Social History Narrative   Not on file   Social Determinants of Health   Financial Resource Strain: Low Risk  (08/18/2022)   Overall Financial Resource Strain (CARDIA)    Difficulty of Paying Living Expenses: Not very hard  Food Insecurity: No Food Insecurity (10/22/2022)    Hunger Vital Sign    Worried About Running Out of Food in the Last Year: Never true    Ran Out of Food in the Last Year: Never true  Transportation Needs: No Transportation Needs (10/22/2022)   PRAPARE - Administrator, Civil Service (Medical): No    Lack of Transportation (Non-Medical): No  Physical Activity: Sufficiently Active (08/18/2022)   Exercise Vital Sign    Days of Exercise per Week: 3 days    Minutes of Exercise per Session: 60 min  Stress: Stress Concern Present (08/18/2022)   [employer] of Occupational Health - Occupational Stress Questionnaire    Feeling of Stress : To some extent  Social Connections: Moderately Isolated (08/18/2022)   Social Connection and Isolation Panel [NHANES]    Frequency of Communication with Friends and Family: Three times a week    Frequency of Social Gatherings with Friends and Family: Twice a week    Attends Religious Services: Never    Database administrator or Organizations: No    Attends Engineer, structural: Never    Marital Status: Married    Review of Systems Per HPI  Objective:  BP 133/87   Pulse (!) 112   Wt 284 lb (128.8 kg)   SpO2 96%   BMI 38.52 kg/m  10/11/2023   10:29 AM 09/04/2023    9:47 AM 06/29/2023    9:20 AM  BP/Weight  Systolic BP 133 123 139  Diastolic BP 87 91 91  Wt. (Lbs) 284 283.2 284  BMI 38.52 kg/m2 38.41 kg/m2 38.52 kg/m2    Physical Exam Vitals and nursing note reviewed.  Constitutional:      Appearance: Normal appearance. She is obese.  Cardiovascular:     Rate and Rhythm: Normal rate and regular rhythm.  Pulmonary:     Effort: Pulmonary effort is normal.     Breath sounds: Normal breath sounds. No wheezing, rhonchi or rales.  Skin:    Comments: Small area of erythema to the left medial elbow.  Neurological:     Mental Status: She is alert.     Lab Results  Component Value Date   WBC 9.4 10/22/2022   HGB 11.6 (L) 10/22/2022   HCT 34.3 (L) 10/22/2022   PLT  387 10/22/2022   GLUCOSE 87 10/09/2022   CHOL 148 04/12/2021   TRIG 164 (H) 04/12/2021   HDL 66 04/12/2021   LDLCALC 55 04/12/2021   ALT 19 10/09/2022   AST 21 10/09/2022   NA 136 10/09/2022   K 3.7 10/09/2022   CL 106 10/09/2022   CREATININE 0.59 10/09/2022   BUN 5 (L) 10/09/2022   CO2 21 (L) 10/09/2022   TSH 1.080 04/12/2021   HGBA1C 5.2 05/01/2022     Assessment & Plan:   Problem List Items Addressed This Visit       Cardiovascular and Mediastinum   Essential hypertension - Primary    Fairly well-controlled this time.  Advised patient to monitor her blood pressure regularly.  Advised that she can consider a trial off medication with close monitoring of her blood pressure.      Relevant Orders   CMP14+EGFR     Musculoskeletal and Integument   Skin lesion    Benign.  Advised to monitor closely.  If fails to resolve, she will let me know.        Other   Postpartum depression    Stable Lexapro.  Continue.      Other Visit Diagnoses     Hypertriglyceridemia       Relevant Orders   Lipid panel   Anemia, unspecified type       Relevant Orders   CBC      Follow-up:  Return in about 6 months (around 04/09/2024) for HTN follow up.  Everlene Other DO [facility]

## 2023-10-11 NOTE — Assessment & Plan Note (Signed)
Benign.  Advised to monitor closely.  If fails to resolve, she will let me know.

## 2023-10-11 NOTE — Patient Instructions (Signed)
Try a trial off your medication and monitor BP.  Labs today.  Follow up in 6 months.

## 2023-10-12 LAB — CMP14+EGFR
ALT: 14 [IU]/L (ref 0–32)
AST: 15 [IU]/L (ref 0–40)
Albumin: 4.6 g/dL (ref 3.9–4.9)
Alkaline Phosphatase: 96 [IU]/L (ref 44–121)
BUN/Creatinine Ratio: 16 (ref 9–23)
BUN: 11 mg/dL (ref 6–20)
Bilirubin Total: 0.4 mg/dL (ref 0.0–1.2)
CO2: 21 mmol/L (ref 20–29)
Calcium: 9.8 mg/dL (ref 8.7–10.2)
Chloride: 103 mmol/L (ref 96–106)
Creatinine, Ser: 0.7 mg/dL (ref 0.57–1.00)
Globulin, Total: 2.9 g/dL (ref 1.5–4.5)
Glucose: 94 mg/dL (ref 70–99)
Potassium: 4 mmol/L (ref 3.5–5.2)
Sodium: 140 mmol/L (ref 134–144)
Total Protein: 7.5 g/dL (ref 6.0–8.5)
eGFR: 118 mL/min/{1.73_m2} (ref >59)

## 2023-10-12 LAB — CBC
Hematocrit: 47.3 % — ABNORMAL HIGH (ref 34.0–46.6)
Hemoglobin: 15.3 g/dL (ref 11.1–15.9)
MCH: 30.1 pg (ref 26.6–33.0)
MCHC: 32.3 g/dL (ref 31.5–35.7)
MCV: 93 fL (ref 79–97)
Platelets: 344 10*3/uL (ref 150–450)
RBC: 5.09 x10E6/uL (ref 3.77–5.28)
RDW: 13.2 % (ref 11.7–15.4)
WBC: 8.2 10*3/uL (ref 3.4–10.8)

## 2023-10-12 LAB — LIPID PANEL
Chol/HDL Ratio: 3.3 ratio (ref 0.0–4.4)
Cholesterol, Total: 201 mg/dL — ABNORMAL HIGH (ref 100–199)
HDL: 61 mg/dL (ref >39)
LDL Chol Calc (NIH): 106 mg/dL — ABNORMAL HIGH (ref 0–99)
Triglycerides: 200 mg/dL — ABNORMAL HIGH (ref 0–149)
VLDL Cholesterol Cal: 34 mg/dL (ref 5–40)

## 2023-10-15 NOTE — Progress Notes (Signed)
Called an unable to leave message. Voicemail not set up. (Nurse Note* Lipids mildly elevated. Healthy diet with focus on whole foods. Regular exercise.)

## 2024-01-13 ENCOUNTER — Other Ambulatory Visit: Payer: Self-pay | Admitting: Adult Health

## 2024-03-10 ENCOUNTER — Other Ambulatory Visit: Payer: Self-pay | Admitting: Family Medicine

## 2024-03-30 ENCOUNTER — Other Ambulatory Visit: Payer: Self-pay | Admitting: Family Medicine

## 2024-03-31 ENCOUNTER — Other Ambulatory Visit: Payer: Self-pay

## 2024-03-31 MED ORDER — MELOXICAM 15 MG PO TABS: 15.0000 mg | ORAL_TABLET | Freq: Every day | ORAL | 0 refills | Status: AC

## 2024-04-07 DIAGNOSIS — S99921A Unspecified injury of right foot, initial encounter: Secondary | ICD-10-CM | POA: Diagnosis not present

## 2024-04-10 ENCOUNTER — Ambulatory Visit: Payer: Medicaid Other | Admitting: Family Medicine

## 2024-04-10 VITALS — BP 127/82 | HR 99 | Temp 98.6°F | Wt 289.0 lb

## 2024-04-10 DIAGNOSIS — E785 Hyperlipidemia, unspecified: Secondary | ICD-10-CM | POA: Diagnosis not present

## 2024-04-10 DIAGNOSIS — R109 Unspecified abdominal pain: Secondary | ICD-10-CM | POA: Insufficient documentation

## 2024-04-10 DIAGNOSIS — E669 Obesity, unspecified: Secondary | ICD-10-CM | POA: Diagnosis not present

## 2024-04-10 DIAGNOSIS — Z130 Encounter for screening for diseases of the blood and blood-forming organs and certain disorders involving the immune mechanism: Secondary | ICD-10-CM

## 2024-04-10 DIAGNOSIS — I10 Essential (primary) hypertension: Secondary | ICD-10-CM | POA: Diagnosis not present

## 2024-04-10 MED ORDER — AMLODIPINE BESYLATE 10 MG PO TABS: 10.0000 mg | ORAL_TABLET | Freq: Every day | ORAL | 3 refills | Status: DC

## 2024-04-10 NOTE — Assessment & Plan Note (Signed)
 Labs today to assess for possible thyroid disease with reported weight gain.

## 2024-04-10 NOTE — Patient Instructions (Signed)
Labs today.  Continue your medications.  Follow up in 6 months.

## 2024-04-10 NOTE — Assessment & Plan Note (Signed)
 Obtaining labs to assess for celiac disease.

## 2024-04-10 NOTE — Progress Notes (Signed)
 Subjective:  Patient ID: Barbara Robertson, female    DOB: [birth year]  Age: 33 y.o. MRN: [id number]  CC:   Chief Complaint  Patient presents with   Obesity    Labs being requested for gluten intolerance and thyroid    HPI:  33 year old female presents for follow-up.  Patient reports that she has gained weight significantly in the past 6 weeks.  She endorses a 20 to 25 pound weight gain.  According to the EMR her weight was 284 in June of last year.  It does not appear that she has had significant weight gain.  Nevertheless, patient is concerned.  Patient also reports that it seems when she eats gluten she has abdominal cramping, nausea, and diarrhea.  She is concerned that she may have gluten intolerance or celiac disease.  She would like labs to be obtained today.  Hypertension stable on amlodipine .   Patient Active Problem List   Diagnosis Date Noted   Abdominal cramping 04/10/2024   Obesity (BMI 30-39.9) 04/10/2024   Skin lesion 10/11/2023   De Quervain's tenosynovitis, right 09/04/2023   Postpartum depression 03/24/2023   Nexplanon  insertion 12/19/2022   History of seizures 05/01/2022   Essential hypertension 04/12/2017   Hx of severe preeclampsia, prior pregnancy, currently pregnant 06/26/2016    Social Hx   Social History   Socioeconomic History   Marital status: Married    Spouse name: Not on file   Number of children: Not on file   Years of education: Not on file   Highest education level: Not on file  Occupational History   Not on file  Tobacco Use   Smoking status: Never   Smokeless tobacco: Never  Vaping Use   Vaping status: Former  Substance and Sexual Activity   Alcohol use: Yes    Alcohol/week: 1.0 standard drink of alcohol    Types: 1 Standard drinks or equivalent per week    Comment: rarely   Drug use: No   Sexual activity: Yes    Partners: Male    Birth control/protection: Implant  Other Topics Concern   Not on file  Social History Narrative    Not on file   Social Drivers of Health   Financial Resource Strain: Low Risk  (08/18/2022)   Overall Financial Resource Strain (CARDIA)    Difficulty of Paying Living Expenses: Not very hard  Food Insecurity: No Food Insecurity (10/22/2022)   Hunger Vital Sign    Worried About Running Out of Food in the Last Year: Never true    Ran Out of Food in the Last Year: Never true  Transportation Needs: No Transportation Needs (10/22/2022)   PRAPARE - Administrator, Civil Service (Medical): No    Lack of Transportation (Non-Medical): No  Physical Activity: Sufficiently Active (08/18/2022)   Exercise Vital Sign    Days of Exercise per Week: 3 days    Minutes of Exercise per Session: 60 min  Stress: Stress Concern Present (08/18/2022)   [employer] of Occupational Health - Occupational Stress Questionnaire    Feeling of Stress : To some extent  Social Connections: Moderately Isolated (08/18/2022)   Social Connection and Isolation Panel [NHANES]    Frequency of Communication with Friends and Family: Three times a week    Frequency of Social Gatherings with Friends and Family: Twice a week    Attends Religious Services: Never    Database administrator or Organizations: No    Attends Banker Meetings:  Never    Marital Status: Married    Review of Systems Per HPI  Objective:  BP 127/82   Pulse 99   Temp 98.6 F (37 C)   Wt 289 lb (131.1 kg)   SpO2 96%   BMI 39.20 kg/m      04/10/2024   10:53 AM 04/10/2024   10:30 AM 10/11/2023   10:29 AM  BP/Weight  Systolic BP 127 131 133  Diastolic BP 82 91 87  Wt. (Lbs)  289 284  BMI  39.2 kg/m2 38.52 kg/m2    Physical Exam Vitals and nursing note reviewed.  Constitutional:      General: She is not in acute distress.    Appearance: Normal appearance. She is obese.  HENT:     Head: Normocephalic and atraumatic.  Cardiovascular:     Rate and Rhythm: Normal rate and regular rhythm.  Pulmonary:     Effort:  Pulmonary effort is normal.     Breath sounds: Normal breath sounds. No wheezing, rhonchi or rales.  Neurological:     Mental Status: She is alert.     Lab Results  Component Value Date   WBC 8.2 10/11/2023   HGB 15.3 10/11/2023   HCT 47.3 (H) 10/11/2023   PLT 344 10/11/2023   GLUCOSE 94 10/11/2023   CHOL 201 (H) 10/11/2023   TRIG 200 (H) 10/11/2023   HDL 61 10/11/2023   LDLCALC 106 (H) 10/11/2023   ALT 14 10/11/2023   AST 15 10/11/2023   NA 140 10/11/2023   K 4.0 10/11/2023   CL 103 10/11/2023   CREATININE 0.70 10/11/2023   BUN 11 10/11/2023   CO2 21 10/11/2023   TSH 1.080 04/12/2021   HGBA1C 5.2 05/01/2022     Assessment & Plan:  Abdominal cramping Assessment & Plan: Obtaining labs to assess for celiac disease.  Orders: -     Celiac Ab tTG DGP TIgA  Essential hypertension Assessment & Plan: Stable on amlodipine .  Continue.  Orders: -     CMP14+EGFR  Hyperlipidemia, unspecified hyperlipidemia type -     Lipid panel  Screening for deficiency anemia -     CBC  Obesity (BMI 30-39.9) Assessment & Plan: Labs today to assess for possible thyroid disease with reported weight gain.  Orders: -     TSH + free T4  Other orders -     amLODIPine  Besylate; Take 1 tablet (10 mg total) by mouth daily.  Dispense: 90 tablet; Refill: 3    Follow-up:  6 months  Avrey Flanagin Debrah Fan DO [facility]

## 2024-04-10 NOTE — Assessment & Plan Note (Signed)
Stable on amlodipine.  Continue.

## 2024-04-13 ENCOUNTER — Encounter: Payer: Self-pay | Admitting: Family Medicine

## 2024-04-14 LAB — CMP14+EGFR
ALT: 12 IU/L (ref 0–32)
AST: 15 IU/L (ref 0–40)
Albumin: 4.7 g/dL (ref 3.9–4.9)
Alkaline Phosphatase: 85 IU/L (ref 44–121)
BUN/Creatinine Ratio: 17 (ref 9–23)
BUN: 11 mg/dL (ref 6–20)
Bilirubin Total: 0.3 mg/dL (ref 0.0–1.2)
CO2: 19 mmol/L — ABNORMAL LOW (ref 20–29)
Calcium: 9.1 mg/dL (ref 8.7–10.2)
Chloride: 106 mmol/L (ref 96–106)
Creatinine, Ser: 0.66 mg/dL (ref 0.57–1.00)
Globulin, Total: 2.4 g/dL (ref 1.5–4.5)
Glucose: 89 mg/dL (ref 70–99)
Potassium: 4.1 mmol/L (ref 3.5–5.2)
Sodium: 139 mmol/L (ref 134–144)
Total Protein: 7.1 g/dL (ref 6.0–8.5)
eGFR: 119 mL/min/{1.73_m2} (ref >59)

## 2024-04-14 LAB — CELIAC AB TTG DGP TIGA
Antigliadin Abs, IgA: 4 U (ref 0–19)
Gliadin IgG: 1 U (ref 0–19)
IgA/Immunoglobulin A, Serum: 273 mg/dL (ref 87–352)
Tissue Transglut Ab: 3 U/mL (ref 0–5)

## 2024-04-14 LAB — CBC
Hematocrit: 44.2 % (ref 34.0–46.6)
Hemoglobin: 14.9 g/dL (ref 11.1–15.9)
MCH: 30.2 pg (ref 26.6–33.0)
MCHC: 33.7 g/dL (ref 31.5–35.7)
MCV: 90 fL (ref 79–97)
Platelets: 342 10*3/uL (ref 150–450)
RBC: 4.93 x10E6/uL (ref 3.77–5.28)
RDW: 13 % (ref 11.7–15.4)
WBC: 7.7 10*3/uL (ref 3.4–10.8)

## 2024-04-14 LAB — LIPID PANEL
Chol/HDL Ratio: 3.2 ratio (ref 0.0–4.4)
Cholesterol, Total: 176 mg/dL (ref 100–199)
HDL: 55 mg/dL (ref >39)
LDL Chol Calc (NIH): 97 mg/dL (ref 0–99)
Triglycerides: 137 mg/dL (ref 0–149)
VLDL Cholesterol Cal: 24 mg/dL (ref 5–40)

## 2024-04-14 LAB — TSH+FREE T4
Free T4: 1.27 ng/dL (ref 0.82–1.77)
TSH: 0.781 u[IU]/mL (ref 0.450–4.500)

## 2024-04-18 ENCOUNTER — Ambulatory Visit: Admitting: Adult Health

## 2024-06-10 ENCOUNTER — Ambulatory Visit: Admitting: Adult Health

## 2024-06-23 ENCOUNTER — Other Ambulatory Visit: Payer: Self-pay | Admitting: Adult Health

## 2024-10-07 ENCOUNTER — Ambulatory Visit
Admission: EM | Admit: 2024-10-07 | Discharge: 2024-10-07 | Disposition: A | Discharge status: Home / Self Care | Attending: Nurse Practitioner | Admitting: Nurse Practitioner

## 2024-10-07 ENCOUNTER — Encounter: Payer: Self-pay | Admitting: Emergency Medicine

## 2024-10-07 ENCOUNTER — Other Ambulatory Visit: Payer: Self-pay

## 2024-10-07 DIAGNOSIS — R350 Frequency of micturition: Secondary | ICD-10-CM

## 2024-10-07 DIAGNOSIS — J069 Acute upper respiratory infection, unspecified: Secondary | ICD-10-CM | POA: Diagnosis not present

## 2024-10-07 LAB — POCT URINE DIPSTICK
Blood, UA: NEGATIVE
Glucose, UA: NEGATIVE mg/dL
Leukocytes, UA: NEGATIVE
Nitrite, UA: NEGATIVE
Spec Grav, UA: >=1.03 — AB (ref 1.010–1.025)
Urobilinogen, UA: 0.2 U/dL
pH, UA: 5.5 (ref 5.0–8.0)

## 2024-10-07 LAB — POC COVID19/FLU A&B COMBO
Covid Antigen, POC: NEGATIVE
Influenza A Antigen, POC: NEGATIVE
Influenza B Antigen, POC: NEGATIVE

## 2024-10-07 MED ORDER — ONDANSETRON 4 MG PO TBDP
4.0000 mg | ORAL_TABLET | Freq: Once | ORAL | Status: AC
  Administered 2024-10-07: 4 mg via ORAL

## 2024-10-07 MED ORDER — ONDANSETRON 4 MG PO TBDP: 4.0000 mg | ORAL_TABLET | Freq: Three times a day (TID) | ORAL | 0 refills | Status: AC | PRN

## 2024-10-07 NOTE — ED Triage Notes (Addendum)
 Pt reports headache, nausea, emesis, nasal congestion since this am.denies any known fevers. Has not taken anything otc pta to UC. Has tolerated gatorade while driving to UC. Declined viral testing in triage.

## 2024-10-07 NOTE — ED Notes (Signed)
Pt tolerating water at this time.

## 2024-10-07 NOTE — Discharge Instructions (Addendum)
 You have a viral infection.  Symptoms should improve over the next week to 10 days.  If you develop chest pain or shortness of breath, go to the emergency room.  COVID-19 and influenza test is negative today.  We gave you Zofran  which seemed to help with the nausea.  The UA today does not show signs of infection.  Please stay home and isolate until you are aware of the results.    Some things that can make you feel better are: - Increased rest - Increasing fluid with water/sugar free electrolytes - Acetaminophen  and ibuprofen  as needed for fever/pain - Salt water gargling, chloraseptic spray and throat lozenges for sore throat - OTC guaifenesin (Mucinex) 600 mg twice daily for congestion - Saline sinus flushes or a neti pot - Humidifying the air

## 2024-10-07 NOTE — ED Provider Notes (Signed)
 [facility]    CSN: [id number] Arrival date & time: 10/07/24  1013      History   Chief Complaint Chief Complaint  Patient presents with   Headache    HPI Barbara Robertson is a 33 y.o. female.   Patient presents today with 1 day history of bodyaches, congested cough, stuffy nose, postnasal drainage, headache, nausea, vomiting, decreased appetite, and fatigue.  Reports 3 episodes of vomiting today so far.  No fever, shortness of breath or chest pain, runny nose, sore throat, or diarrhea.  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  Has tried watered-down Gatorade and Excedrin without much improvement.  Patient is also concerned about urinary frequency today, thinks may be from drinking a lot of water yesterday.  Denies burning with urination, increased urgency, foul urinary odor, hematuria, abdominal pain.  No vaginal discharge.    Past Medical History:  Diagnosis Date   ADHD (attention deficit hyperactivity disorder)    not on meds   Allergic reaction to alpha-gal    History of gestational hypertension    Hypertension    Pregnancy    Seizures (HCC)    last seizure 2016    Patient Active Problem List   Diagnosis Date Noted   Abdominal cramping 04/10/2024   Obesity (BMI 30-39.9) 04/10/2024   Skin lesion 10/11/2023   De Quervain's tenosynovitis, right 09/04/2023   Postpartum depression 03/24/2023   Nexplanon  insertion 12/19/2022   History of seizures 05/01/2022   Essential hypertension 04/12/2017   Hx of severe preeclampsia, prior pregnancy, currently pregnant 06/26/2016    Past Surgical History:  Procedure Laterality Date   NO PAST SURGERIES      OB History     Gravida  4   Para  3   Term  2   Preterm  1   AB  1   Living  3      SAB  1   IAB  0   Ectopic  0   Multiple  0   Live Births  3        Obstetric Comments  No rhogam with SAB in Nov 2016          Home Medications    Prior to Admission  medications   Medication Sig Start Date End Date Taking? Authorizing Provider  ondansetron  (ZOFRAN -ODT) 4 MG disintegrating tablet Take 1 tablet (4 mg total) by mouth every 8 (eight) hours as needed for vomiting or nausea. 10/07/24  Yes Chandra Raisin A, NP  amLODipine  (NORVASC ) 10 MG tablet Take 1 tablet (10 mg total) by mouth daily. 04/10/24   Cook, Jayce G, DO  Blood Pressure Monitor MISC For regular home bp monitoring during pregnancy 05/01/22   Kizzie Suzen SAUNDERS, CNM  escitalopram  (LEXAPRO ) 10 MG tablet TAKE 1 TABLET BY MOUTH EVERY DAY 06/23/24   Signa Delon LABOR, NP  etonogestrel  (NEXPLANON ) 68 MG IMPL implant 1 each by Subdermal route once.    [provider]  hydrochlorothiazide  (MICROZIDE ) 12.5 MG capsule TAKE 1 CAPSULE BY MOUTH EVERY DAY 01/14/24   Signa Delon LABOR, NP  meloxicam  (MOBIC ) 15 MG tablet Take 1 tablet (15 mg total) by mouth daily. 03/31/24   Cook, Jayce G, DO  Multiple Vitamin (MULTIVITAMIN) tablet Take 1 tablet by mouth daily. gummy    [provider]    Family History Family History  Problem Relation Age of Onset   Cancer Maternal Grandmother    Diabetes Maternal Grandmother  COPD Father    Arthritis Father    Heart failure Father    Arthritis Mother    Hypertension Mother     Social History Social History   Tobacco Use   Smoking status: Never   Smokeless tobacco: Never  Vaping Use   Vaping status: Former  Substance Use Topics   Alcohol use: Yes    Alcohol/week: 1.0 standard drink of alcohol    Types: 1 Standard drinks or equivalent per week    Comment: rarely   Drug use: No     Allergies   Alpha-gal, Food, Prednisone, and Wound dressing adhesive   Review of Systems Review of Systems Per HPI  Physical Exam Triage Vital Signs ED Triage Vitals  Encounter Vitals Group     BP 10/07/24 1035 (!) 142/86     Girls Systolic BP Percentile --      Girls Diastolic BP Percentile --      Boys Systolic BP Percentile --       Boys Diastolic BP Percentile --      Pulse Rate 10/07/24 1035 97     Resp 10/07/24 1035 20     Temp 10/07/24 1035 98.2 F (36.8 C)     Temp Source 10/07/24 1035 Oral     SpO2 10/07/24 1035 97 %     Weight --      Height --      Head Circumference --      Peak Flow --      Pain Score 10/07/24 1031 7     Pain Loc --      Pain Education --      Exclude from Growth Chart --    No data found.  Updated Vital Signs BP (!) 142/86 (BP Location: Right Arm)   Pulse 97   Temp 98.2 F (36.8 C) (Oral)   Resp 20   LMP 09/28/2024 (Approximate)   SpO2 97%   Breastfeeding No   Visual Acuity Right Eye Distance:   Left Eye Distance:   Bilateral Distance:    Right Eye Near:   Left Eye Near:    Bilateral Near:     Physical Exam Vitals and nursing note reviewed.  Constitutional:      General: She is not in acute distress.    Appearance: Normal appearance. She is not ill-appearing or toxic-appearing.  HENT:     Head: Normocephalic and atraumatic.     Right Ear: Tympanic membrane, ear canal and external ear normal.     Left Ear: Tympanic membrane, ear canal and external ear normal.     Nose: Congestion present. No rhinorrhea.     Mouth/Throat:     Mouth: Mucous membranes are moist.     Pharynx: Oropharynx is clear. Posterior oropharyngeal erythema present. No oropharyngeal exudate.  Eyes:     General: No scleral icterus.    Extraocular Movements: Extraocular movements intact.  Cardiovascular:     Rate and Rhythm: Normal rate and regular rhythm.  Pulmonary:     Effort: Pulmonary effort is normal. No respiratory distress.     Breath sounds: Normal breath sounds. No wheezing, rhonchi or rales.  Abdominal:     Tenderness: There is no abdominal tenderness. There is no right CVA tenderness or left CVA tenderness.  Musculoskeletal:     Cervical back: Normal range of motion and neck supple.  Lymphadenopathy:     Cervical: No cervical adenopathy.  Skin:    General: Skin is warm and  dry.  Coloration: Skin is not jaundiced or pale.     Findings: No erythema or rash.  Neurological:     Mental Status: She is alert and oriented to person, place, and time.  Psychiatric:        Behavior: Behavior is cooperative.      UC Treatments / Results  Labs (all labs ordered are listed, but only abnormal results are displayed) Labs Reviewed  POCT URINE DIPSTICK - Abnormal; Notable for the following components:      Result Value   Clarity, UA cloudy (*)    Color, UA brown (*)    Bilirubin, UA small (*)    Ketones, POC UA trace (5) (*)    Spec Grav, UA >=1.030 (*)    Protein Ur, POC trace (*)    All other components within normal limits  POC COVID19/FLU A&B COMBO - Normal    EKG   Radiology No results found.  Procedures Procedures (including critical care time)  Medications Ordered in UC Medications  ondansetron  (ZOFRAN -ODT) disintegrating tablet 4 mg (4 mg Oral Given 10/07/24 1100)    Initial Impression / Assessment and Plan / UC Course  I have reviewed the triage vital signs and the nursing notes.  Pertinent labs & imaging results that were available during my care of the patient were reviewed by me and considered in my medical decision making (see chart for details).   Patient is mildly hypertensive in triage, otherwise vital signs are stable.  1. Viral URI with cough Vitals and exam are reassuring COVID-19, influenza testing is negative Supportive care discussed with patient Zofran  given in urgent care today and patient able to tolerate oral fluids Continue Zofran  at home, push fluids, suspect adenovirus Work excuse provided Strict ER precautions discussed  2. Urinary frequency Urinalysis is negative for signs of infection Encouraged hydration with plenty of fluids Return for persistent/worsening symptoms despite treatment and strict ER precautions discussed  The patient was given the opportunity to ask questions.  All questions answered to  their satisfaction.  The patient is in agreement to this plan.   Final Clinical Impressions(s) / UC Diagnoses   Final diagnoses:  Viral URI with cough  Urinary frequency     Discharge Instructions      You have a viral infection.  Symptoms should improve over the next week to 10 days.  If you develop chest pain or shortness of breath, go to the emergency room.  COVID-19 and influenza test is negative today.  We gave you Zofran  which seemed to help with the nausea.  The UA today does not show signs of infection.  Please stay home and isolate until you are aware of the results.    Some things that can make you feel better are: - Increased rest - Increasing fluid with water/sugar free electrolytes - Acetaminophen  and ibuprofen  as needed for fever/pain - Salt water gargling, chloraseptic spray and throat lozenges for sore throat - OTC guaifenesin (Mucinex) 600 mg twice daily for congestion - Saline sinus flushes or a neti pot - Humidifying the air     ED Prescriptions     Medication Sig Dispense Auth. Provider   ondansetron  (ZOFRAN -ODT) 4 MG disintegrating tablet Take 1 tablet (4 mg total) by mouth every 8 (eight) hours as needed for vomiting or nausea. 20 tablet Chandra Harlene LABOR, NP      PDMP not reviewed this encounter.   Chandra Harlene LABOR, NP 10/07/24 1229

## 2024-10-07 NOTE — ED Notes (Signed)
Pt given ice water at this time.  

## 2024-10-13 ENCOUNTER — Ambulatory Visit: Admitting: Family Medicine

## 2024-10-13 VITALS — BP 138/78 | HR 108 | Temp 99.0°F | Ht 72.0 in | Wt 303.4 lb

## 2024-10-13 DIAGNOSIS — Z23 Encounter for immunization: Secondary | ICD-10-CM

## 2024-10-13 DIAGNOSIS — F419 Anxiety disorder, unspecified: Secondary | ICD-10-CM

## 2024-10-13 DIAGNOSIS — F32A Depression, unspecified: Secondary | ICD-10-CM | POA: Insufficient documentation

## 2024-10-13 DIAGNOSIS — I10 Essential (primary) hypertension: Secondary | ICD-10-CM

## 2024-10-13 MED ORDER — HYDROCHLOROTHIAZIDE 12.5 MG PO CAPS: 12.5000 mg | ORAL_CAPSULE | Freq: Every day | ORAL | 3 refills | Status: AC

## 2024-10-13 MED ORDER — AMLODIPINE BESYLATE 10 MG PO TABS: 10.0000 mg | ORAL_TABLET | Freq: Every day | ORAL | 3 refills | Status: AC

## 2024-10-13 MED ORDER — ESCITALOPRAM OXALATE 10 MG PO TABS: 10.0000 mg | ORAL_TABLET | Freq: Every day | ORAL | 3 refills | Status: AC

## 2024-10-13 NOTE — Assessment & Plan Note (Signed)
 Stable. Continue hydrochlorothiazide  and amlodipine . Refilled today.

## 2024-10-13 NOTE — Progress Notes (Signed)
 Subjective:  Patient ID: Barbara Robertson, female    DOB: June 04, 1991  Age: 33 y.o. MRN: [id number]  CC:   Chief Complaint  Patient presents with   Medication follow up    Patient is here for medication follow up.  Patient is taking blood pressure and anxiety medication and that's all. Patient wants to talk about weight loss recommendation.     HPI:  33 year old female presents for follow-up.  BP reasonably well-controlled.  Needs refills on amlodipine  and HCTZ.  Patient states that her anxiety has been high recently as her husband has been out of work.  She states that she needs refill on Lexapro .  Patient inquiring about weight loss medication.  Will discuss today.  Patient Active Problem List   Diagnosis Date Noted   Anxiety and depression 10/13/2024   Obesity (BMI 30-39.9) 04/10/2024   Skin lesion 10/11/2023   De Quervain's tenosynovitis, right 09/04/2023   Nexplanon  insertion 12/19/2022   History of seizures 05/01/2022   Essential hypertension 04/12/2017   Hx of severe preeclampsia, prior pregnancy, currently pregnant 06/26/2016    Social Hx   Social History   Socioeconomic History   Marital status: Married    Spouse name: Not on file   Number of children: Not on file   Years of education: Not on file   Highest education level: Not on file  Occupational History   Not on file  Tobacco Use   Smoking status: Never   Smokeless tobacco: Never  Vaping Use   Vaping status: Former  Substance and Sexual Activity   Alcohol use: Yes    Alcohol/week: 1.0 standard drink of alcohol    Types: 1 Standard drinks or equivalent per week    Comment: rarely   Drug use: No   Sexual activity: Yes    Partners: Male    Birth control/protection: Implant  Other Topics Concern   Not on file  Social History Narrative   Not on file   Social Drivers of Health   Financial Resource Strain: Low Risk  (08/18/2022)   Overall Financial Resource Strain (CARDIA)    Difficulty of Paying  Living Expenses: Not very hard  Food Insecurity: No Food Insecurity (10/22/2022)   Hunger Vital Sign    Worried About Running Out of Food in the Last Year: Never true    Ran Out of Food in the Last Year: Never true  Transportation Needs: No Transportation Needs (10/22/2022)   PRAPARE - Administrator, Civil Service (Medical): No    Lack of Transportation (Non-Medical): No  Physical Activity: Sufficiently Active (08/18/2022)   Exercise Vital Sign    Days of Exercise per Week: 3 days    Minutes of Exercise per Session: 60 min  Stress: Stress Concern Present (08/18/2022)   [employer] of Occupational Health - Occupational Stress Questionnaire    Feeling of Stress : To some extent  Social Connections: Moderately Isolated (08/18/2022)   Social Connection and Isolation Panel    Frequency of Communication with Friends and Family: Three times a week    Frequency of Social Gatherings with Friends and Family: Twice a week    Attends Religious Services: Never    Database Administrator or Organizations: No    Attends Engineer, Structural: Never    Marital Status: Married    Review of Systems Per HPI  Objective:  BP 138/78 (BP Location: Right Arm, Cuff Size: Normal)   Pulse (!) 108   Temp  99 F (37.2 C)   Ht 6' (1.829 m)   Wt (!) 303 lb 6 oz (137.6 kg)   LMP 09/28/2024 (Approximate)   BMI 41.15 kg/m      10/13/2024   11:01 AM 10/13/2024   10:36 AM 10/07/2024   10:35 AM  BP/Weight  Systolic BP 138 135 142  Diastolic BP 78 92 86  Wt. (Lbs)  303.38   BMI  41.15 kg/m2     Physical Exam  Lab Results  Component Value Date   WBC 7.7 04/10/2024   HGB 14.9 04/10/2024   HCT 44.2 04/10/2024   PLT 342 04/10/2024   GLUCOSE 89 04/10/2024   CHOL 176 04/10/2024   TRIG 137 04/10/2024   HDL 55 04/10/2024   LDLCALC 97 04/10/2024   ALT 12 04/10/2024   AST 15 04/10/2024   NA 139 04/10/2024   K 4.1 04/10/2024   CL 106 04/10/2024   CREATININE 0.66 04/10/2024   BUN  11 04/10/2024   CO2 19 (L) 04/10/2024   TSH 0.781 04/10/2024   HGBA1C 5.2 05/01/2022     Assessment & Plan:  Anxiety and depression Assessment & Plan: She believes that her recent worsening is due to husband being out of work.  Continuing Lexapro .  Orders: -     Escitalopram  Oxalate; Take 1 tablet (10 mg total) by mouth daily.  Dispense: 90 tablet; Refill: 3  Immunization due -     Flu vaccine trivalent PF, 6mos and older(Flulaval,Afluria,Fluarix,Fluzone)  Essential hypertension Assessment & Plan: Stable. Continue hydrochlorothiazide  and amlodipine . Refilled today.  Orders: -     hydroCHLOROthiazide ; Take 1 capsule (12.5 mg total) by mouth daily.  Dispense: 90 capsule; Refill: 3 -     amLODIPine  Besylate; Take 1 tablet (10 mg total) by mouth daily.  Dispense: 90 tablet; Refill: 3    Follow-up:  6 months  Elanore Talcott Bluford DO [facility]

## 2024-10-13 NOTE — Patient Instructions (Signed)
Follow up in 6 months.  Take care  Dr. Wren Gallaga  

## 2024-10-13 NOTE — Assessment & Plan Note (Signed)
 She believes that her recent worsening is due to husband being out of work.  Continuing Lexapro .
